# Patient Record
Sex: Male | Born: 1935 | Race: White | Hispanic: No | Marital: Married | Smoking: Never smoker
Health system: Southern US, Community
[De-identification: ages and names within clinical notes are randomized; demographics above are authoritative.]

## PROBLEM LIST (undated history)

## (undated) DIAGNOSIS — I5042 Chronic combined systolic (congestive) and diastolic (congestive) heart failure: Secondary | ICD-10-CM

## (undated) DIAGNOSIS — I10 Essential (primary) hypertension: Secondary | ICD-10-CM

## (undated) DIAGNOSIS — F419 Anxiety disorder, unspecified: Secondary | ICD-10-CM

## (undated) DIAGNOSIS — I340 Nonrheumatic mitral (valve) insufficiency: Secondary | ICD-10-CM

## (undated) DIAGNOSIS — N183 Chronic kidney disease, stage 3 unspecified: Secondary | ICD-10-CM

## (undated) DIAGNOSIS — I2510 Atherosclerotic heart disease of native coronary artery without angina pectoris: Secondary | ICD-10-CM

## (undated) DIAGNOSIS — I351 Nonrheumatic aortic (valve) insufficiency: Secondary | ICD-10-CM

## (undated) DIAGNOSIS — H9190 Unspecified hearing loss, unspecified ear: Secondary | ICD-10-CM

## (undated) DIAGNOSIS — I495 Sick sinus syndrome: Secondary | ICD-10-CM

## (undated) DIAGNOSIS — C61 Malignant neoplasm of prostate: Secondary | ICD-10-CM

## (undated) DIAGNOSIS — G40909 Epilepsy, unspecified, not intractable, without status epilepticus: Secondary | ICD-10-CM

## (undated) DIAGNOSIS — E871 Hypo-osmolality and hyponatremia: Secondary | ICD-10-CM

## (undated) DIAGNOSIS — D649 Anemia, unspecified: Secondary | ICD-10-CM

## (undated) DIAGNOSIS — I4821 Permanent atrial fibrillation: Secondary | ICD-10-CM

## (undated) DIAGNOSIS — Z7901 Long term (current) use of anticoagulants: Secondary | ICD-10-CM

## (undated) HISTORY — PX: PROSTATE SURGERY: SHX751

## (undated) HISTORY — DX: Chronic combined systolic (congestive) and diastolic (congestive) heart failure: I50.42

## (undated) HISTORY — DX: Anemia, unspecified: D64.9

## (undated) HISTORY — DX: Nonrheumatic aortic (valve) insufficiency: I35.1

## (undated) HISTORY — DX: Long term (current) use of anticoagulants: Z79.01

## (undated) HISTORY — DX: Hypo-osmolality and hyponatremia: E87.1

## (undated) HISTORY — DX: Unspecified hearing loss, unspecified ear: H91.90

## (undated) HISTORY — PX: OTHER SURGICAL HISTORY: SHX169

## (undated) HISTORY — DX: Chronic kidney disease, stage 3 (moderate): N18.3

## (undated) HISTORY — DX: Chronic kidney disease, stage 3 unspecified: N18.30

## (undated) HISTORY — DX: Nonrheumatic mitral (valve) insufficiency: I34.0

## (undated) HISTORY — DX: Permanent atrial fibrillation: I48.21

---

## 2000-09-22 DIAGNOSIS — C61 Malignant neoplasm of prostate: Secondary | ICD-10-CM

## 2000-09-22 HISTORY — DX: Malignant neoplasm of prostate: C61

## 2000-09-22 HISTORY — PX: HERNIA REPAIR: SHX51

## 2000-09-22 HISTORY — PX: PROSTATECTOMY: SHX69

## 2000-11-25 ENCOUNTER — Encounter (INDEPENDENT_AMBULATORY_CARE_PROVIDER_SITE_OTHER): Payer: Self-pay | Admitting: Specialist

## 2000-11-25 ENCOUNTER — Other Ambulatory Visit: Admission: RE | Admit: 2000-11-25 | Discharge: 2000-11-25 | Payer: Self-pay | Admitting: Urology

## 2000-12-01 ENCOUNTER — Encounter: Admission: RE | Admit: 2000-12-01 | Discharge: 2000-12-01 | Payer: Self-pay | Admitting: Urology

## 2000-12-01 ENCOUNTER — Encounter: Payer: Self-pay | Admitting: Urology

## 2001-01-26 ENCOUNTER — Encounter: Payer: Self-pay | Admitting: Urology

## 2001-02-02 ENCOUNTER — Encounter (INDEPENDENT_AMBULATORY_CARE_PROVIDER_SITE_OTHER): Payer: Self-pay | Admitting: Specialist

## 2001-02-02 ENCOUNTER — Inpatient Hospital Stay (HOSPITAL_COMMUNITY): Admission: RE | Admit: 2001-02-02 | Discharge: 2001-02-05 | Payer: Self-pay | Admitting: Surgery

## 2002-10-30 ENCOUNTER — Emergency Department (HOSPITAL_COMMUNITY): Admission: EM | Admit: 2002-10-30 | Discharge: 2002-10-30 | Payer: Self-pay | Admitting: Emergency Medicine

## 2002-10-30 ENCOUNTER — Encounter: Payer: Self-pay | Admitting: Emergency Medicine

## 2005-05-22 ENCOUNTER — Ambulatory Visit (HOSPITAL_COMMUNITY): Admission: RE | Admit: 2005-05-22 | Discharge: 2005-05-22 | Payer: Self-pay | Admitting: Cardiology

## 2006-04-13 ENCOUNTER — Encounter: Admission: RE | Admit: 2006-04-13 | Discharge: 2006-04-13 | Payer: Self-pay | Admitting: Cardiology

## 2006-04-29 ENCOUNTER — Inpatient Hospital Stay (HOSPITAL_COMMUNITY): Admission: RE | Admit: 2006-04-29 | Discharge: 2006-04-30 | Payer: Self-pay | Admitting: Cardiology

## 2006-04-29 HISTORY — PX: PACEMAKER INSERTION: SHX728

## 2006-07-21 ENCOUNTER — Ambulatory Visit (HOSPITAL_COMMUNITY): Admission: RE | Admit: 2006-07-21 | Discharge: 2006-07-21 | Payer: Self-pay | Admitting: Cardiology

## 2006-07-30 ENCOUNTER — Ambulatory Visit (HOSPITAL_COMMUNITY): Admission: RE | Admit: 2006-07-30 | Discharge: 2006-07-30 | Payer: Self-pay | Admitting: Cardiology

## 2008-06-06 ENCOUNTER — Encounter: Admission: RE | Admit: 2008-06-06 | Discharge: 2008-07-03 | Payer: Self-pay | Admitting: Family Medicine

## 2009-05-30 ENCOUNTER — Encounter: Admission: RE | Admit: 2009-05-30 | Discharge: 2009-07-23 | Payer: Self-pay | Admitting: Family Medicine

## 2009-09-26 ENCOUNTER — Inpatient Hospital Stay (HOSPITAL_COMMUNITY): Admission: AD | Admit: 2009-09-26 | Discharge: 2009-10-02 | Payer: Self-pay | Admitting: Internal Medicine

## 2009-09-26 ENCOUNTER — Ambulatory Visit: Payer: Self-pay | Admitting: Diagnostic Radiology

## 2009-09-26 ENCOUNTER — Encounter: Payer: Self-pay | Admitting: Emergency Medicine

## 2009-09-27 ENCOUNTER — Encounter (INDEPENDENT_AMBULATORY_CARE_PROVIDER_SITE_OTHER): Payer: Self-pay | Admitting: Internal Medicine

## 2010-12-08 LAB — LIPASE, BLOOD
Lipase: 16 U/L (ref 11–59)
Lipase: 17 U/L (ref 11–59)

## 2010-12-08 LAB — BASIC METABOLIC PANEL
BUN: 13 mg/dL (ref 6–23)
BUN: 14 mg/dL (ref 6–23)
BUN: 15 mg/dL (ref 6–23)
BUN: 15 mg/dL (ref 6–23)
Calcium: 7.9 mg/dL — ABNORMAL LOW (ref 8.4–10.5)
Calcium: 8 mg/dL — ABNORMAL LOW (ref 8.4–10.5)
Calcium: 8.3 mg/dL — ABNORMAL LOW (ref 8.4–10.5)
Calcium: 8.3 mg/dL — ABNORMAL LOW (ref 8.4–10.5)
Calcium: 8.5 mg/dL (ref 8.4–10.5)
Chloride: 93 mEq/L — ABNORMAL LOW (ref 96–112)
Creatinine, Ser: 0.94 mg/dL (ref 0.4–1.5)
Creatinine, Ser: 1.07 mg/dL (ref 0.4–1.5)
Creatinine, Ser: 1.07 mg/dL (ref 0.4–1.5)
GFR calc Af Amer: >60 mL/min (ref >60)
GFR calc Af Amer: >60 mL/min (ref >60)
GFR calc non Af Amer: >60 mL/min (ref >60)
GFR calc non Af Amer: >60 mL/min (ref >60)
GFR calc non Af Amer: >60 mL/min (ref >60)
GFR calc non Af Amer: >60 mL/min (ref >60)
GFR calc non Af Amer: >60 mL/min (ref >60)
GFR calc non Af Amer: >60 mL/min (ref >60)
Glucose, Bld: 128 mg/dL — ABNORMAL HIGH (ref 70–99)
Glucose, Bld: 156 mg/dL — ABNORMAL HIGH (ref 70–99)
Glucose, Bld: 157 mg/dL — ABNORMAL HIGH (ref 70–99)
Glucose, Bld: 91 mg/dL (ref 70–99)
Potassium: 3.2 mEq/L — ABNORMAL LOW (ref 3.5–5.1)
Potassium: 3.8 mEq/L (ref 3.5–5.1)
Potassium: 3.9 mEq/L (ref 3.5–5.1)
Sodium: 116 mEq/L — CL (ref 135–145)
Sodium: 121 mEq/L — ABNORMAL LOW (ref 135–145)
Sodium: 124 mEq/L — ABNORMAL LOW (ref 135–145)

## 2010-12-08 LAB — CARDIAC PANEL(CRET KIN+CKTOT+MB+TROPI)
CK, MB: 1.4 ng/mL (ref 0.3–4.0)
CK, MB: 2.8 ng/mL (ref 0.3–4.0)
Relative Index: 1.3 (ref 0.0–2.5)
Relative Index: 2 (ref 0.0–2.5)
Total CK: 110 U/L (ref 7–232)
Total CK: 209 U/L (ref 7–232)
Total CK: 75 U/L (ref 7–232)
Troponin I: 0.01 ng/mL (ref 0.00–0.06)
Troponin I: 0.03 ng/mL (ref 0.00–0.06)

## 2010-12-08 LAB — COMPREHENSIVE METABOLIC PANEL
ALT: 10 U/L (ref 0–53)
AST: 17 U/L (ref 0–37)
Albumin: 2.7 g/dL — ABNORMAL LOW (ref 3.5–5.2)
Albumin: 4.2 g/dL (ref 3.5–5.2)
BUN: 18 mg/dL (ref 6–23)
CO2: 25 mEq/L (ref 19–32)
Calcium: 7.9 mg/dL — ABNORMAL LOW (ref 8.4–10.5)
Calcium: 8.4 mg/dL (ref 8.4–10.5)
Creatinine, Ser: 1.03 mg/dL (ref 0.4–1.5)
GFR calc Af Amer: >60 mL/min (ref >60)
GFR calc non Af Amer: >60 mL/min (ref >60)
Glucose, Bld: 103 mg/dL — ABNORMAL HIGH (ref 70–99)
Sodium: 122 mEq/L — ABNORMAL LOW (ref 135–145)
Sodium: 119 mEq/L — CL (ref 135–145)
Total Protein: 5 g/dL — ABNORMAL LOW (ref 6.0–8.3)
Total Protein: 7.4 g/dL (ref 6.0–8.3)

## 2010-12-08 LAB — CBC
HCT: 29.7 % — ABNORMAL LOW (ref 39.0–52.0)
HCT: 32.8 % — ABNORMAL LOW (ref 39.0–52.0)
HCT: 39.7 % (ref 39.0–52.0)
Hemoglobin: 13.9 g/dL (ref 13.0–17.0)
MCHC: 35.2 g/dL (ref 30.0–36.0)
MCV: 101.1 fL — ABNORMAL HIGH (ref 78.0–100.0)
Platelets: 113 10*3/uL — ABNORMAL LOW (ref 150–400)
Platelets: 140 10*3/uL — ABNORMAL LOW (ref 150–400)
Platelets: 143 10*3/uL — ABNORMAL LOW (ref 150–400)
Platelets: 195 10*3/uL (ref 150–400)
RBC: 3.33 MIL/uL — ABNORMAL LOW (ref 4.22–5.81)
RDW: 13.4 % (ref 11.5–15.5)
RDW: 13.9 % (ref 11.5–15.5)
RDW: 13.2 % (ref 11.5–15.5)
WBC: 5.2 10*3/uL (ref 4.0–10.5)
WBC: 6.4 10*3/uL (ref 4.0–10.5)
WBC: 7.7 10*3/uL (ref 4.0–10.5)

## 2010-12-08 LAB — BRAIN NATRIURETIC PEPTIDE
Pro B Natriuretic peptide (BNP): 2615 pg/mL — ABNORMAL HIGH (ref 0.0–100.0)
Pro B Natriuretic peptide (BNP): >3200 pg/mL — ABNORMAL HIGH (ref 0.0–100.0)
Pro B Natriuretic peptide (BNP): >3200 pg/mL — ABNORMAL HIGH (ref 0.0–100.0)

## 2010-12-08 LAB — LIPID PANEL
HDL: 42 mg/dL (ref >39)
Triglycerides: 65 mg/dL (ref <150)
VLDL: 13 mg/dL (ref 0–40)

## 2010-12-08 LAB — PROTIME-INR
INR: 2.57 — ABNORMAL HIGH (ref 0.00–1.49)
INR: 2.62 — ABNORMAL HIGH (ref 0.00–1.49)
Prothrombin Time: 22.2 seconds — ABNORMAL HIGH (ref 11.6–15.2)
Prothrombin Time: 26.7 seconds — ABNORMAL HIGH (ref 11.6–15.2)
Prothrombin Time: 27.4 seconds — ABNORMAL HIGH (ref 11.6–15.2)
Prothrombin Time: 27.8 seconds — ABNORMAL HIGH (ref 11.6–15.2)
Prothrombin Time: 34.4 seconds — ABNORMAL HIGH (ref 11.6–15.2)

## 2010-12-08 LAB — SODIUM, URINE, RANDOM: Sodium, Ur: 46 mEq/L

## 2010-12-08 LAB — BLOOD GAS, ARTERIAL
Acid-base deficit: 0.6 mmol/L (ref 0.0–2.0)
O2 Saturation: 97.2 %
TCO2: 25.1 mmol/L (ref 0–100)
pCO2 arterial: 40.7 mmHg (ref 35.0–45.0)
pO2, Arterial: 90.6 mmHg (ref 80.0–100.0)

## 2010-12-08 LAB — DIFFERENTIAL
Basophils Relative: 0 % (ref 0–1)
Eosinophils Relative: 1 % (ref 0–5)
Lymphocytes Relative: 12 % (ref 12–46)
Neutrophils Relative %: 80 % — ABNORMAL HIGH (ref 43–77)

## 2010-12-08 LAB — T4, FREE: Free T4: 1.35 ng/dL (ref 0.80–1.80)

## 2010-12-08 LAB — TSH: TSH: 7.696 u[IU]/mL — ABNORMAL HIGH (ref 0.350–4.500)

## 2010-12-08 LAB — PSA: PSA: 0.17 ng/mL (ref 0.10–4.00)

## 2010-12-08 LAB — PENTOBARBITAL, SERUM: Pentobarbital, Serum: <2 ug/mL

## 2010-12-08 LAB — GLUCOSE, CAPILLARY

## 2011-02-07 NOTE — H&P (Signed)
[facility]  Patient:    Eddie Park, Eddie Park                     MRN: [id number] Adm. Date:  [id number] Attending:  Cristela Blue                         History and Physical  DATE OF BIRTH:  November 21, 1926  REFERRING PHYSICIANS:  Sherren Kerns. Pamella Pert, M.D., Drue Stager. Williford, M.D.  UROLOGIST:  Einar Crow, M.D.  GENERAL SURGEON:  Fenton Malling. Lucia Gaskins, M.D.  UROLOGY FIRST ASSISTANT:  Shanda Bumps., M.D.  HISTORY OF PRESENT ILLNESS:  A 77 year old white male with prostate cancer.  PAST MEDICAL HISTORY:  These are the notable points of the recent past medical history: 1. February 2002 thorough physical examination with Dr. Pamella Pert shows a nodule    in the right lobe of the prostate, PSA 5.7, F/T 11%. 2. March 2002 TRUS with BX showed a Gleason 7 adenocarcinoma involving 70% of    the biopsies on the right side and 10% of the biopsies on the left side.    Bone scan, December 01, 2000, negative.  CT of the abdomen and pelvis December 01, 2000, positive right inguinal hernia with ileum in the hernia, no    evidence of metastatic disease. 3. April 2002, consult with general surgery, Alphonsa Overall, M.D., indicates    that concurrent repair of right inguinal hernia is indicated. 4. Discussed with the patient the various options available to [place] for    treatment of his prostate cancer including watchful waiting, hormonal    therapy, radiation therapy, and surgery.  The patient strongly prefers to    proceed with surgery.  He understands about our concerns about the    possibility of positive margins and positive nodes on the right given his    70% biopsy with a Gleason 7.  I have discussed with him the risks and    benefits which include, but are not included to bleeding, infection, and    injury to adjacent bowel, nerve, and blood vessels.  Also impotence,    incontinence, bladder neck contracture formation.  The patient understands    and  agrees to proceed.  MEDICATIONS:  Phenobarbital, aspirin, which has been discontinued, and multivitamins.  ALLERGIES:  Denies.  SOCIAL HISTORY:  Tobacco denies.  ETOH denies.  PAST SURGICAL HISTORY:  Hernia repair in 1972.  The patient has had multiple hand surgeries.  FAMILY HISTORY:  Father died at 34 of an MI, mom died at 53 of an MI, and brother died at 21 of MI.  The patient has had preoperative cardiology clearance with Rodell Perna, M.D., "negative stress test".  PHYSICAL EXAMINATION:  GENERAL:  Extremely thin, fit, and alert 77 year old male appearing somewhat younger than his stated age.  VITAL SIGNS:  Weight today 138 pounds.  Blood pressure 134/56 pulse 49.  HEENT/NECK:  Negative adenopathy, negative bruit.  LUNGS:  Clear to auscultation and percussion.  HEART:  Regular rate and rhythm without murmur or gallop.  ABDOMEN:  Positive bowel sounds, soft.  Positive right inguinal hernia.  GU:  Bilaterally descended testes.  Nodular right prostate.  NEUROLOGIC:  Grossly intact.  LABORATORY:  Pending at time of dictation.  X-RAY:  Pending at time of dictation.  PLAN:  Will proceed this a.m. with right inguinal hernia repair by Dr. Lucia Gaskins. Radical retropubic prostatectomy and pelvic  lymph node dissection Drs. Humphries and [employer]. DD:  02/02/01 TD:  02/02/01 Job: [id number]

## 2011-02-07 NOTE — Discharge Summary (Signed)
NA[place]arland Park  MIKEN, DESROCHERS NO.:  [id number]   MEDICAL RECORD NO.:  [id number]          PATIENT TYPE:  INP   LOCATION:  3703                         FACILITY:  [facility]   PHYSICIAN:  Barnett Abu, M.D.  DATE OF BIRTH:  September 26, 1926   DATE OF ADMISSION:  04/29/2006  DATE OF DISCHARGE:  04/30/2006                                 DISCHARGE SUMMARY   DISCHARGE DIAGNOSES:  1. Tachy/brady syndrome, status post Jewel chamber pacemaker insertion,      Guidant.  2. Sinus node dysfunction, secondary to above with pacemaker implantation.  3. Recurrent paroxysmal atrial fibrillation, status post cardioversion      August of 2006.  4. Syncope.  5. Known coronary artery disease.  6. Moderate AI, moderate MR.  7. Left ventricular dysfunction with an ejection fraction of 40%      documented in July of 2006.  8. Hypertension.  9. Anxiety.  10.Seizure disorder.  11.Prostate cancer, status post prostatectomy in 2002.  12.Long term Coumadin use.  13.Long term medication use.   Mr. Buglione was seen in the office on April 13, 2006 complaining of a few  episodes of lightheadedness.  He has a history of atrial fibrillation, as  well as a history of syncope and sinus node dysfunction.  Decision was made  to place a permanent pacemaker in this gentleman and he arrived for the  procedure on April 29, 2006.  A dual chamber pacemaker was inserted with a  Guidant device and elective cardioversion was also performed on April 29, 2006 by Dr. Rodell Perna.   The patient was stable over night and was ready for discharge to home the  following day.  His post procedure chest x-ray showed no pneumothorax.  Dr.  Leonia Reeves also felt that the patient needed to be RV paced due to known  intrinsic AV nodal disease.   The patient was discharged home in stable condition on the following  medications:  1. Phenobarbital as prior to admission.  2. Micardis/HCTZ 40/12.5 mg daily.  3. Paxil daily.  4.  Coumadin as prior to admission.  5. Fish oil daily.  6. He is to stop his baby aspirin.  7. He may utilize Tylenol No. 3 p.r.n. chest pain.   Follow up with Dr. Leonia Reeves on May 12, 2006 at 1:15 p.m., with [facility]  Clinic on May 05, 2006 at 12 p.m. and an instruction was given to the  patient regarding activity and wound care.  He is to remain on a low-fat  diet, call for any swelling or increased pain at the pacemaker site.      Joesphine Bare, P.A.      Barnett Abu, M.D.  Electronically Signed    LB/MEDQ  D:  05/27/2006  T:  05/27/2006  Job:  [id number]

## 2011-02-07 NOTE — Op Note (Signed)
NAME:  Eddie Park, Eddie Park NO.:  [id number]   MEDICAL RECORD NO.:  [id number]          PATIENT TYPE:  OIB   LOCATION:  2899                         FACILITY:  [facility]   PHYSICIAN:  Barnett Abu, M.D.  DATE OF BIRTH:  1927/06/19   DATE OF PROCEDURE:  04/29/2006  DATE OF DISCHARGE:                                 OPERATIVE REPORT   PROCEDURES PERFORMED:  1. Dual-chamber permanent transvenous pacemaker insertion.  2. Left subclavian venogram.  3. Elective cardioversion.   INDICATIONS:  Mr. Eddie Park is a 77 year old male with a history of  syncope with documented heart rate of 35 beats per minute.  He was in atrial  fibrillation at that time.  Sinus rhythm was restored and he appeared to  have adequate heart rates in the range of 50-65 beats per minute.  However,  atrial fibrillation has recurred and his ventricular response is slow with a  rate of 40-50 beats per minute.  He is therefore brought to the cardiac  catheterization laboratory for insertion of a permanent transvenous dual-  chamber pacemaker with a diagnosis of paroxysmal atrial fibrillation and  sinus node dysfunction/tachybrady syndrome.  The patient will undergo an  elective cardioversion intraoperatively.  This will facilitate placement of  the atrial lead and restore AV synchrony.   PROCEDURE NOTE:  The patient was brought to the cardiac catheterization  laboratory in the fasting state.  The left prepectoral region was prepped  and draped in the usual sterile fashion.  Local anesthesia was obtained with  infiltration of 1% lidocaine with epinephrine throughout the left  prepectoral region.  A left subclavian venogram was then performed with the  peripheral injection of 20 mL of Omnipaque.  A digital cine angiogram was  obtained in the AP projection and road mapped to guide future left  subclavian puncture.  The venogram did demonstrate the left subclavian vein  to be widely patent and coursing  in a normal fashion over the anterior  surface of the first rib and beneath the middle third of the clavicle.  There was no evidence for persistence of the left superior vena cava.   A 6-7 cm incision was then made in the deltopectoral groove and this was  carried down by sharp dissection and electrocautery to the prepectoral  fascia.  There a are plane was lifted and a pocket formed inferiorly and  medially utilizing blunt dissection and electrocautery.  The pocket was then  packed with a 1% kanamycin-soaked gauze.  Two separate left subclavian  punctures were then performed using an 18-gauge thin-wall needle, through  which was passed a 0.038-inch tight J guidewire.  Over the initial guidewire  a 7-French tearaway sheath and dilator was advanced.  The dilator and wire  were removed and the ventricular lead was advanced to the level of the right  atrium.  The sheath was then torn away.  A figure-of-eight hemostasis suture  was placed to the pectoralis muscle around the insertion site to control  back bleeding.  Using standard technique and fluoroscopic landmarks, the  lead was manipulated into the right ventricular  apex.  There excellent  pacing parameters were obtained, as will be noted below.  The lead was  tested for diaphragmatic pacing at 10 volts and none was found.  The lead  was then sutured into place using three separate 0 silk ligatures.  Over the  remaining guidewire an 8-French tearaway sheath and dilator were advanced.  Dilator was removed.  The wire was allowed to remain in place and the atrial  lead was advanced to the level of the right atrium.  The sheath was then  torn away.  Using standard technique and fluoroscopic landmarks, the lead  was manipulated into the right atrial appendage.  Pacing parameters were not  adequate, however.  Therefore, the screw was released and the lead was  manipulated to the right atrial medial wall.  There adequate pacing  parameters were  obtained after the screw was advanced.  Those parameters are  reported below.  The lead was tested for diaphragmatic pacing at 10 volts  and none was found.  The lead was then sutured in place using three separate  0 silk ligatures.  The kanamycin-soaked gauze was then removed the pocket.  The pocket was copiously irrigated using 1% kanamycin solution.  The leads  were then attached the pacing generator, carefully identifying each by its  serial number and placing each into the appropriate receptacle.  Each lead  was tightened into place and tested for security.  The leads were then wound  beneath the pacing generator and the generator was placed in the pocket.  An  0 silk anchoring suture was applied.  The pocket was then closed using 2-0  Vicryl in a running fashion for the subcutaneous layer.  The skin was  approximated using 4-0 Vicryl in a running subcuticular fashion.  Steri-  Strips and a sterile dressing were applied and the patient was transported  to recovery area in stable condition in an A-sense, V-sense mode.   EQUIPMENT DATA:  The pacing generator is a [employer] 1+ DR,  model number [id number], serial number [id number].  The atrial lead is a Midwife number [id number], serial number [id number].  The ventricular  lead is a Banker number [id number], serial number [id number].   PACING DATA:  The ventricular lead detected a 10.1 mV R wave.  The pacing  threshold is 0.6 Vs at 0.5 msec pulse width.  The impedance was 606 ohms.  The atrial lead detected a 2.8 mV P wave.  The pacing threshold was 1.8 V at  0.5 msec pulse width.  The impedance was 432 ohms.   ELECTIVE CARDIOVERSION:  The patient was adequately sedated using a total of  4 mg of midazolam and 75 mcg of fentanyl.  While monitoring heart rate,  blood pressure, O2 saturation and ECG, the patient was delivered a transthoracic dose of DC energy at 100 joules, biphasic, synchronized.   This  resulted in prompt return of sinus rhythm.   IMPRESSION:  1. Successful elective cardioversion, atrial fibrillation to sinus rhythm.  2. Successful dual-chamber pacemaker insertion with restoration of sinus      rhythm.      Barnett Abu, M.D.  Electronically Signed     JHE/MEDQ  D:  04/29/2006  T:  04/29/2006  Job:  [id number]   cc:   Sherren Kerns. Pamella Pert, MD

## 2011-10-13 DIAGNOSIS — I4891 Unspecified atrial fibrillation: Secondary | ICD-10-CM | POA: Diagnosis not present

## 2011-10-13 DIAGNOSIS — Z7901 Long term (current) use of anticoagulants: Secondary | ICD-10-CM | POA: Diagnosis not present

## 2011-11-12 DIAGNOSIS — I4891 Unspecified atrial fibrillation: Secondary | ICD-10-CM | POA: Diagnosis not present

## 2011-11-12 DIAGNOSIS — Z7901 Long term (current) use of anticoagulants: Secondary | ICD-10-CM | POA: Diagnosis not present

## 2011-11-27 DIAGNOSIS — Z950 Presence of cardiac pacemaker: Secondary | ICD-10-CM | POA: Diagnosis not present

## 2011-12-01 DIAGNOSIS — Z961 Presence of intraocular lens: Secondary | ICD-10-CM | POA: Diagnosis not present

## 2011-12-10 DIAGNOSIS — I4891 Unspecified atrial fibrillation: Secondary | ICD-10-CM | POA: Diagnosis not present

## 2011-12-10 DIAGNOSIS — Z7901 Long term (current) use of anticoagulants: Secondary | ICD-10-CM | POA: Diagnosis not present

## 2011-12-11 DIAGNOSIS — Z79899 Other long term (current) drug therapy: Secondary | ICD-10-CM | POA: Diagnosis not present

## 2011-12-11 DIAGNOSIS — G40909 Epilepsy, unspecified, not intractable, without status epilepticus: Secondary | ICD-10-CM | POA: Diagnosis not present

## 2011-12-11 DIAGNOSIS — I10 Essential (primary) hypertension: Secondary | ICD-10-CM | POA: Diagnosis not present

## 2011-12-11 DIAGNOSIS — F329 Major depressive disorder, single episode, unspecified: Secondary | ICD-10-CM | POA: Diagnosis not present

## 2011-12-18 DIAGNOSIS — E875 Hyperkalemia: Secondary | ICD-10-CM | POA: Diagnosis not present

## 2011-12-18 DIAGNOSIS — D696 Thrombocytopenia, unspecified: Secondary | ICD-10-CM | POA: Diagnosis not present

## 2011-12-31 DIAGNOSIS — E878 Other disorders of electrolyte and fluid balance, not elsewhere classified: Secondary | ICD-10-CM | POA: Diagnosis not present

## 2012-01-08 DIAGNOSIS — E0590 Thyrotoxicosis, unspecified without thyrotoxic crisis or storm: Secondary | ICD-10-CM | POA: Diagnosis not present

## 2012-01-15 DIAGNOSIS — E878 Other disorders of electrolyte and fluid balance, not elsewhere classified: Secondary | ICD-10-CM | POA: Diagnosis not present

## 2012-01-15 DIAGNOSIS — Z7901 Long term (current) use of anticoagulants: Secondary | ICD-10-CM | POA: Diagnosis not present

## 2012-01-15 DIAGNOSIS — I4891 Unspecified atrial fibrillation: Secondary | ICD-10-CM | POA: Diagnosis not present

## 2012-01-20 DIAGNOSIS — N3942 Incontinence without sensory awareness: Secondary | ICD-10-CM | POA: Diagnosis not present

## 2012-01-20 DIAGNOSIS — N393 Stress incontinence (female) (male): Secondary | ICD-10-CM | POA: Diagnosis not present

## 2012-01-20 DIAGNOSIS — R351 Nocturia: Secondary | ICD-10-CM | POA: Diagnosis not present

## 2012-02-23 DIAGNOSIS — R339 Retention of urine, unspecified: Secondary | ICD-10-CM | POA: Diagnosis not present

## 2012-02-23 DIAGNOSIS — N3942 Incontinence without sensory awareness: Secondary | ICD-10-CM | POA: Diagnosis not present

## 2012-02-23 DIAGNOSIS — N3946 Mixed incontinence: Secondary | ICD-10-CM | POA: Diagnosis not present

## 2012-03-01 DIAGNOSIS — D485 Neoplasm of uncertain behavior of skin: Secondary | ICD-10-CM | POA: Diagnosis not present

## 2012-03-01 DIAGNOSIS — L851 Acquired keratosis [keratoderma] palmaris et plantaris: Secondary | ICD-10-CM | POA: Diagnosis not present

## 2012-03-01 DIAGNOSIS — Z85828 Personal history of other malignant neoplasm of skin: Secondary | ICD-10-CM | POA: Diagnosis not present

## 2012-03-01 DIAGNOSIS — D239 Other benign neoplasm of skin, unspecified: Secondary | ICD-10-CM | POA: Diagnosis not present

## 2012-03-05 DIAGNOSIS — I2510 Atherosclerotic heart disease of native coronary artery without angina pectoris: Secondary | ICD-10-CM | POA: Diagnosis not present

## 2012-03-05 DIAGNOSIS — Z7901 Long term (current) use of anticoagulants: Secondary | ICD-10-CM | POA: Diagnosis not present

## 2012-03-05 DIAGNOSIS — Z950 Presence of cardiac pacemaker: Secondary | ICD-10-CM | POA: Diagnosis not present

## 2012-03-05 DIAGNOSIS — I4891 Unspecified atrial fibrillation: Secondary | ICD-10-CM | POA: Diagnosis not present

## 2012-03-12 DIAGNOSIS — I4891 Unspecified atrial fibrillation: Secondary | ICD-10-CM | POA: Diagnosis not present

## 2012-03-12 DIAGNOSIS — Z7901 Long term (current) use of anticoagulants: Secondary | ICD-10-CM | POA: Diagnosis not present

## 2012-03-26 DIAGNOSIS — Z7901 Long term (current) use of anticoagulants: Secondary | ICD-10-CM | POA: Diagnosis not present

## 2012-03-26 DIAGNOSIS — I4891 Unspecified atrial fibrillation: Secondary | ICD-10-CM | POA: Diagnosis not present

## 2012-03-29 ENCOUNTER — Emergency Department (HOSPITAL_COMMUNITY)
Admission: EM | Admit: 2012-03-29 | Discharge: 2012-03-29 | Disposition: A | Discharge status: Home / Self Care | Payer: Medicare Other | Attending: Emergency Medicine | Admitting: Emergency Medicine

## 2012-03-29 ENCOUNTER — Emergency Department (HOSPITAL_COMMUNITY): Payer: Medicare Other

## 2012-03-29 ENCOUNTER — Encounter (HOSPITAL_COMMUNITY): Payer: Self-pay | Admitting: *Deleted

## 2012-03-29 DIAGNOSIS — K222 Esophageal obstruction: Secondary | ICD-10-CM | POA: Diagnosis not present

## 2012-03-29 DIAGNOSIS — R1310 Dysphagia, unspecified: Secondary | ICD-10-CM | POA: Diagnosis not present

## 2012-03-29 DIAGNOSIS — M5030 Other cervical disc degeneration, unspecified cervical region: Secondary | ICD-10-CM | POA: Diagnosis not present

## 2012-03-29 DIAGNOSIS — I517 Cardiomegaly: Secondary | ICD-10-CM | POA: Diagnosis not present

## 2012-03-29 HISTORY — DX: Atherosclerotic heart disease of native coronary artery without angina pectoris: I25.10

## 2012-03-29 NOTE — ED Notes (Signed)
Patient taking little sips of water and tolerating well.

## 2012-03-29 NOTE — ED Notes (Signed)
Pt states can't keep anything down without having reflux; per family water will go down but he feels like it stops like he has something caught in esophagus.  Took vitamin yesterday and felt like it got caught.  Ate breakfast this morning without difficulty; speaking in full sentences no respiratory distress

## 2012-03-29 NOTE — ED Notes (Signed)
Pt. Still feels like there is a slight obstruction when he swallows while drinking water.

## 2012-03-29 NOTE — ED Provider Notes (Signed)
History     CSN: [id number]  Arrival date & time 03/29/12  1800   First MD Initiated Contact with Patient 03/29/12 2005      Chief Complaint  Patient presents with  . Foreign Body    in throat   HPI  History provided by the patient and family. Patient is 78 year old male with history of hypertension, hyperlipidemia, CAD prostate cancer who presents with complaints of difficulty swallowing and foreign body sensation in the esophagus. Patient reports his symptoms came increased earlier this afternoon after eating lunch. Patient was eating vegetables and a piece of chicken at the time. Patient is edentulous and was not wearing his dentures at that time. Family reports that he had similar symptoms 3 weeks ago but they seemed to resolved has not had any prior history of esophageal stricture. Patient denies having any shortness of breath or difficulty breathing. Patient has been handling secretions denies any vomiting symptoms. Symptoms are described as mild to moderate. They're no other aggravating or alleviating factors.    Past Medical History  Diagnosis Date  . Coronary artery disease   . Cancer     Past Surgical History  Procedure Date  . Prostate surgery   . Pacemaker insertion   . Artificial urinary sphincter     No family history on file.  History  Substance Use Topics  . Smoking status: Never Smoker   . Smokeless tobacco: Not on file  . Alcohol Use: No      Review of Systems  Constitutional: Negative for fever and chills.  HENT: Positive for trouble swallowing. Negative for sore throat and drooling.   Respiratory: Negative for shortness of breath.   Cardiovascular: Negative for chest pain.  Gastrointestinal: Negative for nausea, vomiting and abdominal pain.  Neurological: Negative for light-headedness and headaches.    Allergies  Review of patient's allergies indicates no known allergies.  Home Medications   Current Outpatient Rx  Name Route Sig Dispense  Refill  . VITAMIN D 1000 UNITS PO TABS Oral Take 2,000 Units by mouth daily.    . OMEGA-3 FATTY ACIDS 1000 MG PO CAPS Oral Take 2 g by mouth daily.    . FUROSEMIDE 20 MG PO TABS Oral Take 20 mg by mouth daily as needed. For fluid.    [name] LISINOPRIL 20 MG PO TABS Oral Take 20 mg by mouth daily.    [name] METOPROLOL SUCCINATE ER 50 MG PO TB24 Oral Take 50 mg by mouth daily. Take with or immediately following a meal.    . ADULT MULTIVITAMIN W/MINERALS CH Oral Take 1 tablet by mouth daily.    [name] PHENOBARBITAL 97.2 MG PO TABS Oral Take 97.2 mg by mouth at bedtime.    . SERTRALINE HCL 100 MG PO TABS Oral Take 100 mg by mouth daily.    [name] VITAMIN B-12 1000 MCG PO TABS Oral Take 1,000 mcg by mouth daily.    . WARFARIN SODIUM 5 MG PO TABS Oral Take 5-7.5 mg by mouth daily. 1.5 tab on MWF, 1 tab daily all other days.      BP 129/56  Pulse 63  Temp 97.9 F (36.6 C)  Resp 20  SpO2 97%  Physical Exam  Nursing note and vitals reviewed. Constitutional: He is oriented to person, place, and time. He appears well-developed and well-nourished. No distress.  HENT:  Head: Normocephalic.  Mouth/Throat: Oropharynx is clear and moist.       Edentulous. Hearing aids in place.  Neck: Normal range of  motion. Neck supple. No tracheal deviation present.       No crepitus and nontender.  Cardiovascular: Normal rate and regular rhythm.   Pulmonary/Chest: Effort normal and breath sounds normal. No stridor. No respiratory distress. He has no wheezes. He has no rales.  Abdominal: Soft. There is no tenderness.  Neurological: He is alert and oriented to person, place, and time.  Skin: Skin is warm and dry.  Psychiatric: He has a normal mood and affect. His behavior is normal.    ED Course  Procedures   Dg Neck Soft Tissue  03/29/2012  *RADIOLOGY REPORT*  Clinical Data: Feeling of an object stuck in throat  NECK SOFT TISSUES - 1+ VIEW  Comparison: None.  Findings: The cervical vertebrae are in normal alignment.  There is  degenerative disc disease at C5-6 and C6-7 levels.  No prevertebral soft tissue swelling is seen.  No opaque foreign body is noted. The bones are diffusely osteopenic.  IMPRESSION: Normal alignment with degenerative disc disease at C5-6 and C6-7. No opaque foreign body is seen.  Original Report Authenticated By: Joretta Bachelor, M.D.   Dg Chest 2 View  03/29/2012  *RADIOLOGY REPORT*  Clinical Data: Feeling of an stuck in throat  CHEST - 2 VIEW  Comparison: Portable chest x-ray of 09/29/2009  Findings: The lungs are clear but somewhat hyperaerated.  Moderate cardiomegaly is present and a permanent pacemaker is noted.  No opaque foreign body is seen.  The bones are osteopenic.  IMPRESSION: Moderate cardiomegaly with permanent pacemaker.  Slight hyperaeration.  No active lung disease.  Original Report Authenticated By: Joretta Bachelor, M.D.     1. Esophageal stricture       MDM  8:05PM patient seen and evaluated. Patient no acute distress. Patient feeling secretions at this time. Patient has no respiratory complaints. Normal respirations and O2 sats.  Patient was seen and discussed with attending physician. Patient handling secretions and tolerating by mouth liquids. At this time we'll give GI followup and have patient call in the morning for appointment.      Martie Lee, [place] 03/30/12 [phone]

## 2012-03-30 NOTE — ED Provider Notes (Signed)
Medical screening examination/treatment/procedure(s) were performed by non-physician practitioner and as supervising physician I was immediately available for consultation/collaboration.  Jasper Riling. Alvino Chapel, MD 03/30/12 [phone]

## 2012-03-31 ENCOUNTER — Other Ambulatory Visit: Payer: Self-pay | Admitting: Gastroenterology

## 2012-03-31 DIAGNOSIS — R1310 Dysphagia, unspecified: Secondary | ICD-10-CM | POA: Diagnosis not present

## 2012-03-31 DIAGNOSIS — R109 Unspecified abdominal pain: Secondary | ICD-10-CM | POA: Diagnosis not present

## 2012-04-06 ENCOUNTER — Ambulatory Visit
Admission: RE | Admit: 2012-04-06 | Discharge: 2012-04-06 | Disposition: A | Discharge status: Home / Self Care | Payer: Medicare Other | Source: Ambulatory Visit | Attending: Gastroenterology | Admitting: Gastroenterology

## 2012-04-06 DIAGNOSIS — R1310 Dysphagia, unspecified: Secondary | ICD-10-CM

## 2012-04-07 DIAGNOSIS — E0590 Thyrotoxicosis, unspecified without thyrotoxic crisis or storm: Secondary | ICD-10-CM | POA: Diagnosis not present

## 2012-04-16 DIAGNOSIS — Z7901 Long term (current) use of anticoagulants: Secondary | ICD-10-CM | POA: Diagnosis not present

## 2012-04-16 DIAGNOSIS — I4891 Unspecified atrial fibrillation: Secondary | ICD-10-CM | POA: Diagnosis not present

## 2012-04-23 DIAGNOSIS — Z7901 Long term (current) use of anticoagulants: Secondary | ICD-10-CM | POA: Diagnosis not present

## 2012-04-23 DIAGNOSIS — I4891 Unspecified atrial fibrillation: Secondary | ICD-10-CM | POA: Diagnosis not present

## 2012-04-26 DIAGNOSIS — N3946 Mixed incontinence: Secondary | ICD-10-CM | POA: Diagnosis not present

## 2012-04-26 DIAGNOSIS — R351 Nocturia: Secondary | ICD-10-CM | POA: Diagnosis not present

## 2012-04-30 DIAGNOSIS — Z7901 Long term (current) use of anticoagulants: Secondary | ICD-10-CM | POA: Diagnosis not present

## 2012-04-30 DIAGNOSIS — I4891 Unspecified atrial fibrillation: Secondary | ICD-10-CM | POA: Diagnosis not present

## 2012-05-10 DIAGNOSIS — K5900 Constipation, unspecified: Secondary | ICD-10-CM | POA: Diagnosis not present

## 2012-05-10 DIAGNOSIS — R1310 Dysphagia, unspecified: Secondary | ICD-10-CM | POA: Diagnosis not present

## 2012-05-10 DIAGNOSIS — Z7901 Long term (current) use of anticoagulants: Secondary | ICD-10-CM | POA: Diagnosis not present

## 2012-05-10 DIAGNOSIS — I4891 Unspecified atrial fibrillation: Secondary | ICD-10-CM | POA: Diagnosis not present

## 2012-05-28 DIAGNOSIS — I4891 Unspecified atrial fibrillation: Secondary | ICD-10-CM | POA: Diagnosis not present

## 2012-05-28 DIAGNOSIS — Z7901 Long term (current) use of anticoagulants: Secondary | ICD-10-CM | POA: Diagnosis not present

## 2012-06-08 ENCOUNTER — Encounter (HOSPITAL_COMMUNITY): Payer: Self-pay | Admitting: Pharmacy Technician

## 2012-06-08 ENCOUNTER — Encounter (HOSPITAL_COMMUNITY)
Admission: RE | Disposition: A | Discharge status: Home / Self Care | Payer: Self-pay | Source: Ambulatory Visit | Attending: Internal Medicine

## 2012-06-08 ENCOUNTER — Encounter (HOSPITAL_COMMUNITY): Payer: Self-pay | Admitting: Internal Medicine

## 2012-06-08 ENCOUNTER — Ambulatory Visit (HOSPITAL_COMMUNITY)
Admission: RE | Admit: 2012-06-08 | Discharge: 2012-06-08 | Disposition: A | Discharge status: Home / Self Care | Payer: Medicare Other | Source: Ambulatory Visit | Attending: Internal Medicine | Admitting: Internal Medicine

## 2012-06-08 DIAGNOSIS — I4821 Permanent atrial fibrillation: Secondary | ICD-10-CM

## 2012-06-08 DIAGNOSIS — I10 Essential (primary) hypertension: Secondary | ICD-10-CM | POA: Diagnosis not present

## 2012-06-08 DIAGNOSIS — I495 Sick sinus syndrome: Secondary | ICD-10-CM

## 2012-06-08 DIAGNOSIS — Z45018 Encounter for adjustment and management of other part of cardiac pacemaker: Secondary | ICD-10-CM | POA: Insufficient documentation

## 2012-06-08 DIAGNOSIS — I059 Rheumatic mitral valve disease, unspecified: Secondary | ICD-10-CM | POA: Insufficient documentation

## 2012-06-08 DIAGNOSIS — I498 Other specified cardiac arrhythmias: Secondary | ICD-10-CM | POA: Insufficient documentation

## 2012-06-08 DIAGNOSIS — I4891 Unspecified atrial fibrillation: Secondary | ICD-10-CM | POA: Insufficient documentation

## 2012-06-08 DIAGNOSIS — Z950 Presence of cardiac pacemaker: Secondary | ICD-10-CM | POA: Diagnosis not present

## 2012-06-08 DIAGNOSIS — Z7901 Long term (current) use of anticoagulants: Secondary | ICD-10-CM | POA: Diagnosis not present

## 2012-06-08 HISTORY — PX: PERMANENT PACEMAKER GENERATOR CHANGE: SHX6022

## 2012-06-08 HISTORY — DX: Malignant neoplasm of prostate: C61

## 2012-06-08 HISTORY — DX: Essential (primary) hypertension: I10

## 2012-06-08 HISTORY — DX: Epilepsy, unspecified, not intractable, without status epilepticus: G40.909

## 2012-06-08 HISTORY — DX: Anxiety disorder, unspecified: F41.9

## 2012-06-08 HISTORY — DX: Sick sinus syndrome: I49.5

## 2012-06-08 LAB — BASIC METABOLIC PANEL
CO2: 26 mEq/L (ref 19–32)
Calcium: 9.1 mg/dL (ref 8.4–10.5)
Chloride: 100 mEq/L (ref 96–112)
Creatinine, Ser: 1.38 mg/dL — ABNORMAL HIGH (ref 0.50–1.35)
Glucose, Bld: 88 mg/dL (ref 70–99)

## 2012-06-08 LAB — CBC
HCT: 31.1 % — ABNORMAL LOW (ref 39.0–52.0)
Hemoglobin: 10.4 g/dL — ABNORMAL LOW (ref 13.0–17.0)
MCH: 32.7 pg (ref 26.0–34.0)
MCHC: 33.4 g/dL (ref 30.0–36.0)
MCV: 97.8 fL (ref 78.0–100.0)
Platelets: 111 10*3/uL — ABNORMAL LOW (ref 150–400)
RBC: 3.18 MIL/uL — ABNORMAL LOW (ref 4.22–5.81)
RDW: 11.9 % (ref 11.5–15.5)
WBC: 6.2 10*3/uL (ref 4.0–10.5)

## 2012-06-08 LAB — SURGICAL PCR SCREEN
MRSA, PCR: NEGATIVE
Staphylococcus aureus: NEGATIVE

## 2012-06-08 SURGERY — PERMANENT PACEMAKER GENERATOR CHANGE
Anesthesia: LOCAL

## 2012-06-08 MED ORDER — SODIUM CHLORIDE 0.9 % IV SOLN: 250.0000 mL | INTRAVENOUS | Status: DC

## 2012-06-08 MED ORDER — SODIUM CHLORIDE 0.9 % IJ SOLN: 3.0000 mL | INTRAMUSCULAR | Status: DC | PRN

## 2012-06-08 MED ORDER — SODIUM CHLORIDE 0.9 % IJ SOLN: 3.0000 mL | Freq: Two times a day (BID) | INTRAMUSCULAR | Status: DC

## 2012-06-08 MED ORDER — MUPIROCIN 2 % EX OINT
TOPICAL_OINTMENT | CUTANEOUS | Status: AC
  Filled 2012-06-08: qty 22

## 2012-06-08 MED ORDER — SODIUM CHLORIDE 0.9 % IV SOLN: 250.0000 mL | INTRAVENOUS | Status: DC | PRN

## 2012-06-08 MED ORDER — ACETAMINOPHEN 325 MG PO TABS: 325.0000 mg | ORAL_TABLET | ORAL | Status: DC | PRN

## 2012-06-08 MED ORDER — CHLORHEXIDINE GLUCONATE 4 % EX LIQD
60.0000 mL | Freq: Once | CUTANEOUS | Status: DC
  Filled 2012-06-08: qty 60

## 2012-06-08 MED ORDER — SODIUM CHLORIDE 0.9 % IR SOLN
80.0000 mg | Status: DC
  Filled 2012-06-08: qty 2

## 2012-06-08 MED ORDER — CEFAZOLIN SODIUM-DEXTROSE 2-3 GM-% IV SOLR
2.0000 g | INTRAVENOUS | Status: DC
  Filled 2012-06-08 (×2): qty 50

## 2012-06-08 MED ORDER — LIDOCAINE HCL (PF) 1 % IJ SOLN
INTRAMUSCULAR | Status: AC
  Filled 2012-06-08: qty 60

## 2012-06-08 MED ORDER — SODIUM CHLORIDE 0.45 % IV SOLN
INTRAVENOUS | Status: DC
  Administered 2012-06-08: 50 mL/h via INTRAVENOUS

## 2012-06-08 MED ORDER — MUPIROCIN 2 % EX OINT
TOPICAL_OINTMENT | Freq: Two times a day (BID) | CUTANEOUS | Status: DC
  Administered 2012-06-08: 1 via NASAL
  Filled 2012-06-08: qty 22

## 2012-06-08 MED ORDER — ONDANSETRON HCL 4 MG/2ML IJ SOLN: 4.0000 mg | Freq: Four times a day (QID) | INTRAMUSCULAR | Status: DC | PRN

## 2012-06-08 NOTE — Op Note (Signed)
SURGEON:  Thompson Grayer, MD     PREPROCEDURE DIAGNOSES:   1. Symptomatic bradycardia  2. Persistent Atrial fibrillation.     POSTPROCEDURE DIAGNOSES:   1. Symptomatic bradycardia  2. Persistent Atrial fibrillation.     PROCEDURES:   1. Pacemaker pulse generator replacement.   2. Skin pocket revision.     INTRODUCTION:  Eddie Park is a 78 y.o. male with a history of symptomatic bradycardia. He has done well since his pacemaker was implanted.  He has recently reached EOL battery status.  He presents today for pacemaker pulse generator replacement.       DESCRIPTION OF THE PROCEDURE:  Informed written consent was obtained, and the patient was brought to the electrophysiology lab in the fasting state.  The patient's pacemaker was interrogated today and found to be at elective replacement indicator battery status.  The patient required no sedation for the procedure today.  The patient's left chest was prepped and draped in the usual sterile fashion by the EP lab staff.  The skin overlying the existing pacemaker was infiltrated with lidocaine for local analgesia.  A 4-cm incision was made over the pacemaker pocket.  Using a combination of sharp and blunt dissection, the pacemaker was exposed and removed from the body.  The device was disconnected from the leads. A single silk suture was identified and removed which had secured the device to the pectoralis fascia.  There was no foreign matter or debris within the pocket.  The atrial lead was confirmed to be a Mudlogger- (serial number [id number]) lead implanted on 04/29/2006.  The right ventricular lead was confirmed to be a Nutritional therapist (serial number [id number]) lead implanted on the same date as the atrial lead (above).  Both leads were examined and their integrity was confirmed to be intact.  Atrial fibrillation waves measured 0.4 mV with impedance of 328 ohms.  Threshold could not be measured due to afib.  Right  ventricular lead R-waves measured 12.8 mV with impedance of 393 ohms and a threshold of 0.6 V at 0.5 msec.  Both leads were connected to a [place] [id number] EL (serial number [id number]) pacemaker.  The pocket was revised to accommodate this new device.  Electrocautery was required to assure hemostasis.  The pocket was irrigated with copious gentamicin solution. The pacemaker was then placed into the pocket.  The pocket was then closed in 2 layers with 2-0 Vicryl suture over the subcutaneous and subcuticular layers.  Steri-Strips and a sterile dressing were then applied.  There were no early apparent complications.     CONCLUSIONS:   1. Successful pacemaker pulse generator replacement for end of life battery status   2. No early apparent complications.     Thompson Grayer, MD 06/08/2012 5:03 PM

## 2012-06-08 NOTE — H&P (Signed)
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 male with a h/o persistent atrial fibrillation, bradycardia, and syncope sp PPM [employer] investment [employer]) by Dr Leonia Reeves who presents today for urgent EP evaluation.  He presented initially in 2007 with symptomatic bradycardia and syncope with atrial fibrillation.  He was cardioverted and had improvement in heart rates initially.  He returned to afib and had syncope which was felt to be due to bradycardia.  He therefore underwent PPM implantation at that time.  The patient reports doing very well since having a pacemaker implanted and remains very active despite his age.  He continues to drive.  He has had no further syncope since his pacemaker was implanted. In June, his device was interrogated and per report was felt to have more than 1 year of battery remaining.  Over the past few days, he reports decreased exercise tolerance and dizziness.  His wife checked his heart rate and found it to be 50.  The patient therefore presented to Dr Hassell Done office today where the device was interrogated and found to have reverted to VVI 50 bpm and had reached end of life abruptly.  Due to concerns that his battery longevity is not dependable, he is sent urgently to [facility].  Today, he  denies symptoms of palpitations, chest pain, orthopnea, PND, lower extremity edema,presyncope, syncope, or neurologic sequela.  The patientis tolerating medications without difficulties and is otherwise without complaint today.   Past Medical History  Diagnosis Date  . Coronary artery disease     s/p MI 1995  . Prostate cancer 2002  . Persistent atrial fibrillation   . Tachycardia-bradycardia syndrome     with syncope, s/p PPM by Dr Leonia Reeves  . Hypertension   . Seizure disorder   . Anxiety   . Mitral valve regurgitation    Past Surgical History  Procedure Date  . Prostate surgery   . Pacemaker insertion 04/29/06    [employer] PPM  implanted by Dr Leonia Reeves for tachy/brady syndrome and syncope  . Artificial urinary sphincter     History   Social History  . Marital Status: Married    Spouse Name: N/A    Number of Children: N/A  . Years of Education: N/A   Occupational History  . Not on file.   Social History Main Topics  . Smoking status: Never Smoker   . Smokeless tobacco: Not on file  . Alcohol Use: No  . Drug Use: No  . Sexually Active: Not on file   Other Topics Concern  . Not on file   Social History Narrative   Lives in [place]    Family History  Problem Relation Age of Onset  . Coronary artery disease      No Known Allergies  Current Facility-Administered Medications  Medication Dose Route Frequency Provider Last Rate Last Dose  . 0.45 % sodium chloride infusion   Intravenous Continuous Thompson Grayer, MD      . 0.9 %  sodium chloride infusion  250 mL Intravenous Continuous Thompson Grayer, MD      . ceFAZolin (ANCEF) IVPB 2 g/50 mL premix  2 g Intravenous On Call Thompson Grayer, MD      . chlorhexidine (HIBICLENS) 4 % liquid 4 application  60 mL Topical Once Thompson Grayer, MD      . gentamicin (GARAMYCIN) 80 mg in sodium chloride irrigation 0.9 % 500 mL irrigation  80 mg Irrigation On Call Thompson Grayer,  MD      . sodium chloride 0.9 % injection 3 mL  3 mL Intravenous Q12H Thompson Grayer, MD      . sodium chloride 0.9 % injection 3 mL  3 mL Intravenous PRN Thompson Grayer, MD        ROS- all systems are reviewed and negative except as per HPI  Physical Exam: There were no vitals filed for this visit.  GEN- The patient is elderly appearing, alert and oriented x 3 today.   Head- normocephalic, atraumatic Eyes-  Sclera clear, conjunctiva pink Ears- hearing intact Oropharynx- clear Neck- supple, no JVP Lymph- no cervical lymphadenopathy Lungs- Clear to ausculation bilaterally, normal work of breathing Chest- pacemaker pocket is well healed Heart- Regular rate and rhythm  (paced) GI- soft, NT, ND, + BS Extremities- no clubbing, cyanosis, or edema MS- diffuse muscle atrophy Skin- no rash or lesion, L sided pacemaker pocket is well healed Psych- euthymic mood, full affect Neuro- strength and sensation are intact  Pacemaker interrogation- reviewed in detail today and confirms EOL status INR today is 2.8 per Dr Hassell Done office  Assessment and Plan:  1. Bradycardia/ pacemaker at end of life.  The patient has symptomatic bradycardia and prior syncope.  His pacemaker has reached end of life and is no longer dependable.  I would therefore recommend pacemaker pulse generator replacement at this time.  Risks, benefits, alternatives to this procedure were discussed in detail with the patient today. The patient understands that the risks include but are not limited to bleeding, infection, and if lead revision is required--> pneumothorax, perforation, tamponade, vascular damage, renal failure, MI, stroke, death,  and lead dislodgement.  The patient accepts these risks and wishes to proceed. We will therefore proceed with the procedure at this time.   2. Persistent afib Continue coumadin long term, though hold coumadin for the next 48 hours s/p PPM generator change  3. HTN- stable  4. CAD- stable  Jeneen Rinks Odelia Graciano,MD

## 2012-06-10 ENCOUNTER — Encounter: Payer: Self-pay | Admitting: *Deleted

## 2012-06-10 DIAGNOSIS — Z950 Presence of cardiac pacemaker: Secondary | ICD-10-CM | POA: Insufficient documentation

## 2012-06-15 DIAGNOSIS — I4891 Unspecified atrial fibrillation: Secondary | ICD-10-CM | POA: Diagnosis not present

## 2012-06-15 DIAGNOSIS — Z7901 Long term (current) use of anticoagulants: Secondary | ICD-10-CM | POA: Diagnosis not present

## 2012-06-21 ENCOUNTER — Encounter: Payer: Self-pay | Admitting: Internal Medicine

## 2012-06-21 ENCOUNTER — Ambulatory Visit (INDEPENDENT_AMBULATORY_CARE_PROVIDER_SITE_OTHER): Payer: Medicare Other | Admitting: *Deleted

## 2012-06-21 DIAGNOSIS — I495 Sick sinus syndrome: Secondary | ICD-10-CM

## 2012-06-21 LAB — PACEMAKER DEVICE OBSERVATION
AL IMPEDENCE PM: 394 Ohm
ATRIAL PACING PM: 1
BRDY-0002RV: 60 {beats}/min
RV LEAD IMPEDENCE PM: 373 Ohm

## 2012-06-21 NOTE — Progress Notes (Signed)
Wound check-PPM 

## 2012-06-23 ENCOUNTER — Encounter: Payer: Medicare Other | Admitting: Internal Medicine

## 2012-06-25 DIAGNOSIS — I4891 Unspecified atrial fibrillation: Secondary | ICD-10-CM | POA: Diagnosis not present

## 2012-06-25 DIAGNOSIS — Z7901 Long term (current) use of anticoagulants: Secondary | ICD-10-CM | POA: Diagnosis not present

## 2012-06-28 DIAGNOSIS — R1310 Dysphagia, unspecified: Secondary | ICD-10-CM | POA: Diagnosis not present

## 2012-07-08 DIAGNOSIS — Z23 Encounter for immunization: Secondary | ICD-10-CM | POA: Diagnosis not present

## 2012-07-08 DIAGNOSIS — E0590 Thyrotoxicosis, unspecified without thyrotoxic crisis or storm: Secondary | ICD-10-CM | POA: Diagnosis not present

## 2012-07-16 DIAGNOSIS — Z7901 Long term (current) use of anticoagulants: Secondary | ICD-10-CM | POA: Diagnosis not present

## 2012-07-16 DIAGNOSIS — I4891 Unspecified atrial fibrillation: Secondary | ICD-10-CM | POA: Diagnosis not present

## 2012-07-30 DIAGNOSIS — I4891 Unspecified atrial fibrillation: Secondary | ICD-10-CM | POA: Diagnosis not present

## 2012-07-30 DIAGNOSIS — Z7901 Long term (current) use of anticoagulants: Secondary | ICD-10-CM | POA: Diagnosis not present

## 2012-08-27 DIAGNOSIS — Z7901 Long term (current) use of anticoagulants: Secondary | ICD-10-CM | POA: Diagnosis not present

## 2012-08-27 DIAGNOSIS — I4891 Unspecified atrial fibrillation: Secondary | ICD-10-CM | POA: Diagnosis not present

## 2012-09-08 DIAGNOSIS — Z7901 Long term (current) use of anticoagulants: Secondary | ICD-10-CM | POA: Diagnosis not present

## 2012-09-08 DIAGNOSIS — I4891 Unspecified atrial fibrillation: Secondary | ICD-10-CM | POA: Diagnosis not present

## 2012-09-08 DIAGNOSIS — I059 Rheumatic mitral valve disease, unspecified: Secondary | ICD-10-CM | POA: Diagnosis not present

## 2012-09-08 DIAGNOSIS — I2510 Atherosclerotic heart disease of native coronary artery without angina pectoris: Secondary | ICD-10-CM | POA: Diagnosis not present

## 2012-09-09 DIAGNOSIS — E0590 Thyrotoxicosis, unspecified without thyrotoxic crisis or storm: Secondary | ICD-10-CM | POA: Diagnosis not present

## 2012-10-06 ENCOUNTER — Encounter: Payer: Self-pay | Admitting: Internal Medicine

## 2012-10-06 ENCOUNTER — Ambulatory Visit (INDEPENDENT_AMBULATORY_CARE_PROVIDER_SITE_OTHER): Payer: Medicare Other | Admitting: Internal Medicine

## 2012-10-06 VITALS — BP 107/52 | HR 62 | Ht 66.0 in | Wt 113.0 lb

## 2012-10-06 DIAGNOSIS — I4891 Unspecified atrial fibrillation: Secondary | ICD-10-CM | POA: Diagnosis not present

## 2012-10-06 DIAGNOSIS — I495 Sick sinus syndrome: Secondary | ICD-10-CM | POA: Diagnosis not present

## 2012-10-06 DIAGNOSIS — Z7901 Long term (current) use of anticoagulants: Secondary | ICD-10-CM | POA: Diagnosis not present

## 2012-10-06 LAB — PACEMAKER DEVICE OBSERVATION
AL AMPLITUDE: 0.6 mv
ATRIAL PACING PM: 1
BRDY-0002RV: 60 {beats}/min
BRDY-0003RV: 110 {beats}/min
DEVICE MODEL PM: 111635

## 2012-10-06 NOTE — Progress Notes (Signed)
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  Since his recent generator change, the patient reports doing very well.  Today, he denies symptoms of palpitations, chest pain, shortness of breath,  lower extremity edema, dizziness, presyncope, or syncope.  The patient is otherwise without complaint today.   Past Medical History  Diagnosis Date  . Coronary artery disease     s/p MI 1995  . Prostate cancer 2002  . Persistent atrial fibrillation   . Tachycardia-bradycardia syndrome     with syncope, s/p PPM by Dr Leonia Reeves  . Hypertension   . Seizure disorder   . Anxiety   . Mitral valve regurgitation    Past Surgical History  Procedure Date  . Prostate surgery   . Pacemaker insertion 04/29/06    Generator change (BSc) by Dr Rayann Heman 06/21/12  . Artificial urinary sphincter     Current Outpatient Prescriptions  Medication Sig Dispense Refill  . cholecalciferol (VITAMIN D) 1000 UNITS tablet Take 2,000 Units by mouth daily.      . fish oil-omega-3 fatty acids 1000 MG capsule Take 2 g by mouth daily.      [name] lisinopril (PRINIVIL,ZESTRIL) 20 MG tablet Take 20 mg by mouth daily.      . methimazole (TAPAZOLE) 5 MG tablet Take 5 mg by mouth daily.      . metoprolol succinate (TOPROL-XL) 50 MG 24 hr tablet Take 50 mg by mouth daily. Take with or immediately following a meal.      . Multiple Vitamin (MULTIVITAMIN WITH MINERALS) TABS Take 1 tablet by mouth daily.      [name] PHENobarbital (LUMINAL) 97.2 MG tablet Take 97.2 mg by mouth at bedtime.      . sertraline (ZOLOFT) 100 MG tablet Take 100 mg by mouth daily.      . vitamin B-12 (CYANOCOBALAMIN) 1000 MCG tablet Take 1,000 mcg by mouth daily.      [name] warfarin (COUMADIN) 5 MG tablet Take 5-7.5 mg by mouth daily. 1.5 (=7.5mg ) tab on MWF, 1 (=5mg ) tab daily all other days.        Physical Exam: Filed Vitals:   10/06/12 0900  BP: 107/52  Pulse: 62  Height: 5\' 6"   (1.676 m)  Weight: 113 lb (51.256 kg)  SpO2: 96%    GEN- The patient is well appearing, alert and oriented x 3 today.   Head- normocephalic, atraumatic Eyes-  Sclera clear, conjunctiva pink Ears- hearing intact Oropharynx- clear Lungs- Clear to ausculation bilaterally, normal work of breathing Chest- pacemaker pocket is well healed Heart- Regular rate and rhythm, no murmurs, rubs or gallops, PMI not laterally displaced GI- soft, NT, ND, + BS Extremities- no clubbing, cyanosis, or edema  Pacemaker interrogation- reviewed in detail today,  See PACEART report  Assessment and Plan:  1. Bradycardia/ tachycardia Normal pacemaker function See Claudia Desanctis Art report No changes today  2. afib Continue coumadin long term Return 9/14 for device follow-up  Dr Irish Lack to see as scheduled

## 2012-10-06 NOTE — Patient Instructions (Signed)
Your physician wants you to follow-up in: Sept 2014 with Dr Rayann Heman Dennis Bast will receive a reminder letter in the mail two months in advance. If you don't receive a letter, please call our office to schedule the follow-up appointment.

## 2012-11-01 DIAGNOSIS — Z7901 Long term (current) use of anticoagulants: Secondary | ICD-10-CM | POA: Diagnosis not present

## 2012-11-01 DIAGNOSIS — I4891 Unspecified atrial fibrillation: Secondary | ICD-10-CM | POA: Diagnosis not present

## 2012-11-16 DIAGNOSIS — E0590 Thyrotoxicosis, unspecified without thyrotoxic crisis or storm: Secondary | ICD-10-CM | POA: Diagnosis not present

## 2012-11-30 DIAGNOSIS — Z79899 Other long term (current) drug therapy: Secondary | ICD-10-CM | POA: Diagnosis not present

## 2012-11-30 DIAGNOSIS — I10 Essential (primary) hypertension: Secondary | ICD-10-CM | POA: Diagnosis not present

## 2012-11-30 DIAGNOSIS — E7800 Pure hypercholesterolemia, unspecified: Secondary | ICD-10-CM | POA: Diagnosis not present

## 2012-12-01 DIAGNOSIS — I4891 Unspecified atrial fibrillation: Secondary | ICD-10-CM | POA: Diagnosis not present

## 2012-12-01 DIAGNOSIS — Z7901 Long term (current) use of anticoagulants: Secondary | ICD-10-CM | POA: Diagnosis not present

## 2012-12-09 DIAGNOSIS — Z961 Presence of intraocular lens: Secondary | ICD-10-CM | POA: Diagnosis not present

## 2012-12-15 DIAGNOSIS — Z7901 Long term (current) use of anticoagulants: Secondary | ICD-10-CM | POA: Diagnosis not present

## 2012-12-15 DIAGNOSIS — I4891 Unspecified atrial fibrillation: Secondary | ICD-10-CM | POA: Diagnosis not present

## 2013-01-12 DIAGNOSIS — I4891 Unspecified atrial fibrillation: Secondary | ICD-10-CM | POA: Diagnosis not present

## 2013-01-12 DIAGNOSIS — Z7901 Long term (current) use of anticoagulants: Secondary | ICD-10-CM | POA: Diagnosis not present

## 2013-02-16 DIAGNOSIS — I4891 Unspecified atrial fibrillation: Secondary | ICD-10-CM | POA: Diagnosis not present

## 2013-02-16 DIAGNOSIS — Z7901 Long term (current) use of anticoagulants: Secondary | ICD-10-CM | POA: Diagnosis not present

## 2013-02-23 DIAGNOSIS — R351 Nocturia: Secondary | ICD-10-CM | POA: Diagnosis not present

## 2013-02-23 DIAGNOSIS — N3946 Mixed incontinence: Secondary | ICD-10-CM | POA: Diagnosis not present

## 2013-03-02 DIAGNOSIS — I4891 Unspecified atrial fibrillation: Secondary | ICD-10-CM | POA: Diagnosis not present

## 2013-03-02 DIAGNOSIS — Z7901 Long term (current) use of anticoagulants: Secondary | ICD-10-CM | POA: Diagnosis not present

## 2013-03-07 DIAGNOSIS — E0590 Thyrotoxicosis, unspecified without thyrotoxic crisis or storm: Secondary | ICD-10-CM | POA: Diagnosis not present

## 2013-03-08 DIAGNOSIS — L821 Other seborrheic keratosis: Secondary | ICD-10-CM | POA: Diagnosis not present

## 2013-03-08 DIAGNOSIS — I781 Nevus, non-neoplastic: Secondary | ICD-10-CM | POA: Diagnosis not present

## 2013-03-08 DIAGNOSIS — L219 Seborrheic dermatitis, unspecified: Secondary | ICD-10-CM | POA: Diagnosis not present

## 2013-03-08 DIAGNOSIS — D239 Other benign neoplasm of skin, unspecified: Secondary | ICD-10-CM | POA: Diagnosis not present

## 2013-03-08 DIAGNOSIS — Z85828 Personal history of other malignant neoplasm of skin: Secondary | ICD-10-CM | POA: Diagnosis not present

## 2013-03-10 DIAGNOSIS — E0590 Thyrotoxicosis, unspecified without thyrotoxic crisis or storm: Secondary | ICD-10-CM | POA: Diagnosis not present

## 2013-03-18 DIAGNOSIS — I2510 Atherosclerotic heart disease of native coronary artery without angina pectoris: Secondary | ICD-10-CM | POA: Diagnosis not present

## 2013-03-18 DIAGNOSIS — I4891 Unspecified atrial fibrillation: Secondary | ICD-10-CM | POA: Diagnosis not present

## 2013-03-18 DIAGNOSIS — I059 Rheumatic mitral valve disease, unspecified: Secondary | ICD-10-CM | POA: Diagnosis not present

## 2013-03-29 ENCOUNTER — Other Ambulatory Visit: Payer: Self-pay | Admitting: Family Medicine

## 2013-03-29 ENCOUNTER — Ambulatory Visit
Admission: RE | Admit: 2013-03-29 | Discharge: 2013-03-29 | Disposition: A | Discharge status: Home / Self Care | Payer: Medicare Other | Source: Ambulatory Visit | Attending: Family Medicine | Admitting: Family Medicine

## 2013-03-29 DIAGNOSIS — R42 Dizziness and giddiness: Secondary | ICD-10-CM

## 2013-03-29 DIAGNOSIS — R51 Headache: Secondary | ICD-10-CM | POA: Diagnosis not present

## 2013-03-29 DIAGNOSIS — T1490XA Injury, unspecified, initial encounter: Secondary | ICD-10-CM

## 2013-03-29 DIAGNOSIS — R519 Headache, unspecified: Secondary | ICD-10-CM

## 2013-03-29 DIAGNOSIS — Z7901 Long term (current) use of anticoagulants: Secondary | ICD-10-CM | POA: Diagnosis not present

## 2013-03-29 DIAGNOSIS — S0990XA Unspecified injury of head, initial encounter: Secondary | ICD-10-CM | POA: Diagnosis not present

## 2013-03-31 DIAGNOSIS — I4891 Unspecified atrial fibrillation: Secondary | ICD-10-CM | POA: Diagnosis not present

## 2013-03-31 DIAGNOSIS — I059 Rheumatic mitral valve disease, unspecified: Secondary | ICD-10-CM | POA: Diagnosis not present

## 2013-03-31 DIAGNOSIS — I2510 Atherosclerotic heart disease of native coronary artery without angina pectoris: Secondary | ICD-10-CM | POA: Diagnosis not present

## 2013-04-07 DIAGNOSIS — Z7901 Long term (current) use of anticoagulants: Secondary | ICD-10-CM | POA: Diagnosis not present

## 2013-04-07 DIAGNOSIS — I4891 Unspecified atrial fibrillation: Secondary | ICD-10-CM | POA: Diagnosis not present

## 2013-04-22 DIAGNOSIS — Z7901 Long term (current) use of anticoagulants: Secondary | ICD-10-CM | POA: Diagnosis not present

## 2013-04-22 DIAGNOSIS — I4891 Unspecified atrial fibrillation: Secondary | ICD-10-CM | POA: Diagnosis not present

## 2013-05-20 DIAGNOSIS — Z7901 Long term (current) use of anticoagulants: Secondary | ICD-10-CM | POA: Diagnosis not present

## 2013-05-20 DIAGNOSIS — I4891 Unspecified atrial fibrillation: Secondary | ICD-10-CM | POA: Diagnosis not present

## 2013-05-31 DIAGNOSIS — Z23 Encounter for immunization: Secondary | ICD-10-CM | POA: Diagnosis not present

## 2013-05-31 DIAGNOSIS — I10 Essential (primary) hypertension: Secondary | ICD-10-CM | POA: Diagnosis not present

## 2013-05-31 DIAGNOSIS — R569 Unspecified convulsions: Secondary | ICD-10-CM | POA: Diagnosis not present

## 2013-05-31 DIAGNOSIS — E7800 Pure hypercholesterolemia, unspecified: Secondary | ICD-10-CM | POA: Diagnosis not present

## 2013-06-10 DIAGNOSIS — E0590 Thyrotoxicosis, unspecified without thyrotoxic crisis or storm: Secondary | ICD-10-CM | POA: Diagnosis not present

## 2013-06-16 DIAGNOSIS — I4891 Unspecified atrial fibrillation: Secondary | ICD-10-CM | POA: Diagnosis not present

## 2013-06-16 DIAGNOSIS — Z7901 Long term (current) use of anticoagulants: Secondary | ICD-10-CM | POA: Diagnosis not present

## 2013-07-06 ENCOUNTER — Encounter (INDEPENDENT_AMBULATORY_CARE_PROVIDER_SITE_OTHER): Payer: Self-pay

## 2013-07-06 ENCOUNTER — Encounter: Payer: Self-pay | Admitting: Internal Medicine

## 2013-07-06 ENCOUNTER — Ambulatory Visit (INDEPENDENT_AMBULATORY_CARE_PROVIDER_SITE_OTHER): Payer: Medicare Other | Admitting: Internal Medicine

## 2013-07-06 VITALS — BP 110/48 | HR 71 | Ht 66.0 in | Wt 116.4 lb

## 2013-07-06 DIAGNOSIS — Z950 Presence of cardiac pacemaker: Secondary | ICD-10-CM

## 2013-07-06 DIAGNOSIS — I495 Sick sinus syndrome: Secondary | ICD-10-CM

## 2013-07-06 DIAGNOSIS — I4891 Unspecified atrial fibrillation: Secondary | ICD-10-CM | POA: Diagnosis not present

## 2013-07-06 LAB — PACEMAKER DEVICE OBSERVATION
BRDY-0002RV: 60 {beats}/min
BRDY-0003RV: 110 {beats}/min
DEVICE MODEL PM: 111635
RV LEAD THRESHOLD: 0.7 V

## 2013-07-06 NOTE — Patient Instructions (Addendum)
Your physician wants you to follow-up in: 12 months with  Eddie Park You will receive a reminder letter in the mail two months in advance. If you don't receive a letter, please call our office to schedule the follow-up appointment.   Remote monitoring is used to monitor your Pacemaker or ICD from home. This monitoring reduces the number of office visits required to check your device to one time per year. It allows [place] to keep an eye on the functioning of your device to ensure it is working properly. You are scheduled for a device check from home on 10/06/13. You may send your transmission at any time that day. If you have a wireless device, the transmission will be sent automatically. After your physician reviews your transmission, you will receive a postcard with your next transmission date.

## 2013-07-06 NOTE — Progress Notes (Signed)
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  Since his last visit, the patient reports doing very well.  Today, he denies symptoms of palpitations, chest pain, shortness of breath,  lower extremity edema, dizziness, presyncope, or syncope.  The patient is otherwise without complaint today.   Past Medical History  Diagnosis Date  . Coronary artery disease     s/p MI 1995  . Prostate cancer 2002  . Persistent atrial fibrillation   . Tachycardia-bradycardia syndrome     with syncope, s/p PPM by Dr Leonia Reeves  . Hypertension   . Seizure disorder   . Anxiety   . Mitral valve regurgitation    Past Surgical History  Procedure Laterality Date  . Prostate surgery    . Pacemaker insertion  04/29/06    Generator change (BSc) by Dr Rayann Heman 06/21/12  . Artificial urinary sphincter      Current Outpatient Prescriptions  Medication Sig Dispense Refill  . cholecalciferol (VITAMIN D) 1000 UNITS tablet Take 2,000 Units by mouth daily.      . fish oil-omega-3 fatty acids 1000 MG capsule Take 2 g by mouth daily.      [name] lisinopril (PRINIVIL,ZESTRIL) 20 MG tablet Take 1/2 tablet daliy      . methimazole (TAPAZOLE) 5 MG tablet Take 5 mg by mouth daily.      . metoprolol succinate (TOPROL-XL) 50 MG 24 hr tablet Take 1/2 tablet daily with or immediately following a meal.      . Multiple Vitamin (MULTIVITAMIN WITH MINERALS) TABS Take 1 tablet by mouth daily.      [name] PHENobarbital (LUMINAL) 97.2 MG tablet Take 97.2 mg by mouth at bedtime.      . sertraline (ZOLOFT) 100 MG tablet Take 100 mg by mouth daily.      . vitamin B-12 (CYANOCOBALAMIN) 1000 MCG tablet Take 1,000 mcg by mouth daily.      [name] warfarin (COUMADIN) 5 MG tablet Take 5-7.5 mg by mouth daily. 1.5 (=7.5mg ) tab on MWF, 1 (=5mg ) tab daily all other days.       No current facility-administered medications for this visit.    Physical Exam: Filed Vitals:   07/06/13  0944  BP: 110/48  Pulse: 71  Height: 5\' 6"  (1.676 m)  Weight: 116 lb 6.4 oz (52.799 kg)    GEN- The patient is well appearing, alert and oriented x 3 today.   Head- normocephalic, atraumatic Eyes-  Sclera clear, conjunctiva pink Ears- hearing intact Oropharynx- clear Lungs- Clear to ausculation bilaterally, normal work of breathing Chest- pacemaker pocket is well healed Heart- Regular rate and rhythm, no murmurs, rubs or gallops, PMI not laterally displaced GI- soft, NT, ND, + BS Extremities- no clubbing, cyanosis, or edema  Pacemaker interrogation- reviewed in detail today,  See PACEART report  Assessment and Plan:  1. Bradycardia/ tachycardia Normal pacemaker function See Pace Art report No changes today  2. afib Continue coumadin long term  [place]itude every 3 months Return to see Jerene Pitch in the device clinic in 1 year

## 2013-07-14 ENCOUNTER — Encounter: Payer: Self-pay | Admitting: Internal Medicine

## 2013-07-21 ENCOUNTER — Ambulatory Visit (INDEPENDENT_AMBULATORY_CARE_PROVIDER_SITE_OTHER): Payer: Medicare Other | Admitting: *Deleted

## 2013-07-21 DIAGNOSIS — Z7901 Long term (current) use of anticoagulants: Secondary | ICD-10-CM | POA: Diagnosis not present

## 2013-07-21 DIAGNOSIS — I4891 Unspecified atrial fibrillation: Secondary | ICD-10-CM

## 2013-08-25 ENCOUNTER — Ambulatory Visit (INDEPENDENT_AMBULATORY_CARE_PROVIDER_SITE_OTHER): Payer: Medicare Other | Admitting: Pharmacist

## 2013-08-25 DIAGNOSIS — I4891 Unspecified atrial fibrillation: Secondary | ICD-10-CM

## 2013-08-25 DIAGNOSIS — Z7901 Long term (current) use of anticoagulants: Secondary | ICD-10-CM

## 2013-09-02 DIAGNOSIS — E0590 Thyrotoxicosis, unspecified without thyrotoxic crisis or storm: Secondary | ICD-10-CM | POA: Diagnosis not present

## 2013-09-08 DIAGNOSIS — E0590 Thyrotoxicosis, unspecified without thyrotoxic crisis or storm: Secondary | ICD-10-CM | POA: Diagnosis not present

## 2013-09-08 DIAGNOSIS — E538 Deficiency of other specified B group vitamins: Secondary | ICD-10-CM | POA: Diagnosis not present

## 2013-09-20 ENCOUNTER — Ambulatory Visit (INDEPENDENT_AMBULATORY_CARE_PROVIDER_SITE_OTHER): Payer: Medicare Other | Admitting: Pharmacist

## 2013-09-20 DIAGNOSIS — Z5181 Encounter for therapeutic drug level monitoring: Secondary | ICD-10-CM

## 2013-09-20 DIAGNOSIS — I4891 Unspecified atrial fibrillation: Secondary | ICD-10-CM

## 2013-09-20 DIAGNOSIS — Z7901 Long term (current) use of anticoagulants: Secondary | ICD-10-CM | POA: Diagnosis not present

## 2013-09-20 LAB — POCT INR: INR: 2.5

## 2013-10-06 ENCOUNTER — Encounter: Payer: Self-pay | Admitting: Internal Medicine

## 2013-10-06 ENCOUNTER — Encounter: Payer: Medicare Other | Admitting: *Deleted

## 2013-10-06 DIAGNOSIS — Z950 Presence of cardiac pacemaker: Secondary | ICD-10-CM

## 2013-10-06 DIAGNOSIS — I495 Sick sinus syndrome: Secondary | ICD-10-CM

## 2013-10-06 LAB — MDC_IDC_ENUM_SESS_TYPE_REMOTE
Implantable Pulse Generator Serial Number: 111635
Lead Channel Impedance Value: 378 Ohm
Lead Channel Impedance Value: 398 Ohm
Lead Channel Sensing Intrinsic Amplitude: 1.1 mV
Lead Channel Sensing Intrinsic Amplitude: 19.6 mV
MDC IDC STAT BRADY RV PERCENT PACED: 94 %

## 2013-10-17 ENCOUNTER — Encounter: Payer: Self-pay | Admitting: *Deleted

## 2013-10-19 ENCOUNTER — Ambulatory Visit (INDEPENDENT_AMBULATORY_CARE_PROVIDER_SITE_OTHER): Payer: Medicare Other | Admitting: *Deleted

## 2013-10-19 DIAGNOSIS — Z7901 Long term (current) use of anticoagulants: Secondary | ICD-10-CM | POA: Diagnosis not present

## 2013-10-19 DIAGNOSIS — Z5181 Encounter for therapeutic drug level monitoring: Secondary | ICD-10-CM | POA: Diagnosis not present

## 2013-10-19 DIAGNOSIS — I4891 Unspecified atrial fibrillation: Secondary | ICD-10-CM

## 2013-10-19 LAB — POCT INR: INR: 3

## 2013-11-16 ENCOUNTER — Ambulatory Visit (INDEPENDENT_AMBULATORY_CARE_PROVIDER_SITE_OTHER): Payer: Medicare Other | Admitting: Pharmacist

## 2013-11-16 DIAGNOSIS — I4891 Unspecified atrial fibrillation: Secondary | ICD-10-CM

## 2013-11-16 DIAGNOSIS — Z7901 Long term (current) use of anticoagulants: Secondary | ICD-10-CM | POA: Diagnosis not present

## 2013-11-16 DIAGNOSIS — Z5181 Encounter for therapeutic drug level monitoring: Secondary | ICD-10-CM | POA: Diagnosis not present

## 2013-11-16 LAB — POCT INR: INR: 3.1

## 2013-11-30 DIAGNOSIS — N183 Chronic kidney disease, stage 3 unspecified: Secondary | ICD-10-CM | POA: Diagnosis not present

## 2013-11-30 DIAGNOSIS — E7800 Pure hypercholesterolemia, unspecified: Secondary | ICD-10-CM | POA: Diagnosis not present

## 2013-11-30 DIAGNOSIS — I10 Essential (primary) hypertension: Secondary | ICD-10-CM | POA: Diagnosis not present

## 2013-11-30 DIAGNOSIS — Z79899 Other long term (current) drug therapy: Secondary | ICD-10-CM | POA: Diagnosis not present

## 2013-11-30 DIAGNOSIS — G40909 Epilepsy, unspecified, not intractable, without status epilepticus: Secondary | ICD-10-CM | POA: Diagnosis not present

## 2013-11-30 DIAGNOSIS — Z1331 Encounter for screening for depression: Secondary | ICD-10-CM | POA: Diagnosis not present

## 2013-11-30 DIAGNOSIS — Z8659 Personal history of other mental and behavioral disorders: Secondary | ICD-10-CM | POA: Diagnosis not present

## 2013-12-07 DIAGNOSIS — E0590 Thyrotoxicosis, unspecified without thyrotoxic crisis or storm: Secondary | ICD-10-CM | POA: Diagnosis not present

## 2013-12-07 DIAGNOSIS — E538 Deficiency of other specified B group vitamins: Secondary | ICD-10-CM | POA: Diagnosis not present

## 2013-12-12 ENCOUNTER — Other Ambulatory Visit: Payer: Self-pay | Admitting: Interventional Cardiology

## 2013-12-15 ENCOUNTER — Ambulatory Visit: Payer: Medicare Other | Admitting: Interventional Cardiology

## 2013-12-15 ENCOUNTER — Ambulatory Visit (INDEPENDENT_AMBULATORY_CARE_PROVIDER_SITE_OTHER): Payer: Medicare Other | Admitting: Pharmacist Clinician (PhC)/ Clinical Pharmacy Specialist

## 2013-12-15 DIAGNOSIS — I4891 Unspecified atrial fibrillation: Secondary | ICD-10-CM

## 2013-12-15 DIAGNOSIS — Z7901 Long term (current) use of anticoagulants: Secondary | ICD-10-CM

## 2013-12-15 DIAGNOSIS — Z5181 Encounter for therapeutic drug level monitoring: Secondary | ICD-10-CM | POA: Diagnosis not present

## 2013-12-15 LAB — POCT INR: INR: 2.1

## 2013-12-22 DIAGNOSIS — Z961 Presence of intraocular lens: Secondary | ICD-10-CM | POA: Diagnosis not present

## 2014-01-04 DIAGNOSIS — H26499 Other secondary cataract, unspecified eye: Secondary | ICD-10-CM | POA: Diagnosis not present

## 2014-01-11 ENCOUNTER — Ambulatory Visit (INDEPENDENT_AMBULATORY_CARE_PROVIDER_SITE_OTHER): Payer: Medicare Other | Admitting: *Deleted

## 2014-01-11 DIAGNOSIS — Z7901 Long term (current) use of anticoagulants: Secondary | ICD-10-CM

## 2014-01-11 DIAGNOSIS — I4891 Unspecified atrial fibrillation: Secondary | ICD-10-CM

## 2014-01-11 DIAGNOSIS — Z5181 Encounter for therapeutic drug level monitoring: Secondary | ICD-10-CM | POA: Diagnosis not present

## 2014-01-11 DIAGNOSIS — E0590 Thyrotoxicosis, unspecified without thyrotoxic crisis or storm: Secondary | ICD-10-CM | POA: Diagnosis not present

## 2014-01-11 LAB — POCT INR: INR: 3.3

## 2014-01-12 ENCOUNTER — Encounter: Payer: Self-pay | Admitting: Internal Medicine

## 2014-01-12 ENCOUNTER — Ambulatory Visit (INDEPENDENT_AMBULATORY_CARE_PROVIDER_SITE_OTHER): Payer: Medicare Other | Admitting: *Deleted

## 2014-01-12 DIAGNOSIS — I495 Sick sinus syndrome: Secondary | ICD-10-CM | POA: Diagnosis not present

## 2014-01-15 LAB — MDC_IDC_ENUM_SESS_TYPE_REMOTE
Implantable Pulse Generator Serial Number: 111635
Lead Channel Impedance Value: 389 Ohm
MDC IDC MSMT BATTERY REMAINING LONGEVITY: 96 mo
MDC IDC MSMT LEADCHNL RA SENSING INTR AMPL: 0.8 mV
MDC IDC MSMT LEADCHNL RV IMPEDANCE VALUE: 372 Ohm
MDC IDC STAT BRADY RA PERCENT PACED: 0 %
MDC IDC STAT BRADY RV PERCENT PACED: 97 %

## 2014-01-18 DIAGNOSIS — H26499 Other secondary cataract, unspecified eye: Secondary | ICD-10-CM | POA: Diagnosis not present

## 2014-01-23 NOTE — Progress Notes (Signed)
PPM remote 

## 2014-01-25 ENCOUNTER — Encounter: Payer: Self-pay | Admitting: Interventional Cardiology

## 2014-01-25 ENCOUNTER — Ambulatory Visit (INDEPENDENT_AMBULATORY_CARE_PROVIDER_SITE_OTHER): Payer: Medicare Other | Admitting: Interventional Cardiology

## 2014-01-25 ENCOUNTER — Ambulatory Visit (INDEPENDENT_AMBULATORY_CARE_PROVIDER_SITE_OTHER): Payer: Medicare Other | Admitting: Pharmacist

## 2014-01-25 VITALS — BP 115/49 | HR 59 | Ht 66.0 in | Wt 117.2 lb

## 2014-01-25 DIAGNOSIS — I4891 Unspecified atrial fibrillation: Secondary | ICD-10-CM

## 2014-01-25 DIAGNOSIS — I2510 Atherosclerotic heart disease of native coronary artery without angina pectoris: Secondary | ICD-10-CM | POA: Insufficient documentation

## 2014-01-25 DIAGNOSIS — I059 Rheumatic mitral valve disease, unspecified: Secondary | ICD-10-CM

## 2014-01-25 DIAGNOSIS — Z7901 Long term (current) use of anticoagulants: Secondary | ICD-10-CM | POA: Diagnosis not present

## 2014-01-25 DIAGNOSIS — I359 Nonrheumatic aortic valve disorder, unspecified: Secondary | ICD-10-CM | POA: Diagnosis not present

## 2014-01-25 DIAGNOSIS — Z5181 Encounter for therapeutic drug level monitoring: Secondary | ICD-10-CM

## 2014-01-25 DIAGNOSIS — I340 Nonrheumatic mitral (valve) insufficiency: Secondary | ICD-10-CM | POA: Insufficient documentation

## 2014-01-25 DIAGNOSIS — I351 Nonrheumatic aortic (valve) insufficiency: Secondary | ICD-10-CM | POA: Insufficient documentation

## 2014-01-25 LAB — POCT INR: INR: 2.3

## 2014-01-25 NOTE — Progress Notes (Signed)
Patient ID: Eddie Park, male   DOB: 25-Sep-1926, 80 y.o.   MRN: [id number]    [address], [place]  [address] Phone: [phone] Fax:  [phone]  Date:  01/25/2014   ID:  Eddie Park, DOB 02/22/1927, MRN [id number]  PCP:  Gerrit Heck, MD      History of Present Illness: Eddie Park is a 80 y.o. male with AFib, mitral regurgitation, cardiomyopathy, and pacer dependent. He is now in AFib 100% of the time by pacer check. EF 45% in 2011. Some arm pain at times.  Atrial Fibrillation F/U:  c/o Dizziness while getting up from sitting position.  Denies : Chest pain.  Leg edema.  Orthopnea.  Palpitations.  Shortness of breath.  Syncope.  some walking, daily. No bleeding problems.    Wt Readings from Last 3 Encounters:  01/25/14 117 lb 3.2 oz (53.162 kg)  07/06/13 116 lb 6.4 oz (52.799 kg)  10/06/12 113 lb (51.256 kg)     Past Medical History  Diagnosis Date  . Coronary artery disease     s/p MI 1995  . Prostate cancer 2002  . Persistent atrial fibrillation   . Tachycardia-bradycardia syndrome     with syncope, s/p PPM by Dr Leonia Reeves  . Hypertension   . Seizure disorder   . Anxiety   . Mitral valve regurgitation     Current Outpatient Prescriptions  Medication Sig Dispense Refill  . cholecalciferol (VITAMIN D) 1000 UNITS tablet Take 2,000 Units by mouth daily.      . fish oil-omega-3 fatty acids 1000 MG capsule Take 2 g by mouth daily.      [name] lisinopril (PRINIVIL,ZESTRIL) 20 MG tablet Take 1/2 tablet daliy      . metoprolol succinate (TOPROL-XL) 50 MG 24 hr tablet Take 1/2 tablet daily with or immediately following a meal.      . Multiple Vitamin (MULTIVITAMIN WITH MINERALS) TABS Take 1 tablet by mouth daily.      [name] PHENobarbital (LUMINAL) 97.2 MG tablet Take 97.2 mg by mouth at bedtime.      . sertraline (ZOLOFT) 100 MG tablet Take 100 mg by mouth daily.      . vitamin B-12 (CYANOCOBALAMIN) 1000 MCG tablet Take 1,000 mcg  by mouth daily.      [name] warfarin (COUMADIN) 5 MG tablet 1.5 tablets everyday except 2 tablets on Mondays and Fridays or as directed  180 tablet  1   No current facility-administered medications for this visit.    Allergies:   No Known Allergies  Social History:  The patient  reports that he has never smoked. He does not have any smokeless tobacco history on file. He reports that he does not drink alcohol or use illicit drugs.   Family History:  The patient's family history includes Coronary artery disease in an other family member.   ROS:  Please see the history of present illness.  No nausea, vomiting.  No fevers, chills.  No focal weakness.  No dysuria.   All other systems reviewed and negative.   PHYSICAL EXAM: VS:  BP 115/49  Pulse 59  Ht 5\' 6"  (1.676 m)  Wt 117 lb 3.2 oz (53.162 kg)  BMI 18.93 kg/m2 Well nourished, well developed, in no acute distress HEENT: normal Neck: no JVD, no carotid bruits Cardiac:  normal S1, S2; Irregularly irregular Lungs:  clear to auscultation bilaterally, no wheezing, rhonchi or rales Abd: soft, nontender, no hepatomegaly Ext: no edema Skin:  warm and dry Neuro:   no focal abnormalities noted       ASSESSMENT AND PLAN:  Coronary atherosclerosis of unspecified type of vessel, native or graft  Notes: No angina. MI in 1995.  2. Mitral Regurgitation    severe MR/ severe AI.  Medical therapy only.  Discussed at length with the patient and wife.  He would not be a surgical candidate.  Would  not pursue any further w/u    Notes: moderate to severe in the past with mildly decreased LV function.  He will let us know if he has any sx of fluid overload.   3. Atrial fibrillation  Notes: Permanent AFib.  Paced much of the time. Coumadin for stroke prevention.  No bleeding issues.    4.  Wife states that his activity level is stable.  No obvious SHOB.   Signed, Mina Marble, MD, [facility] 01/25/2014 11:59 AM

## 2014-01-25 NOTE — Patient Instructions (Signed)
Your physician recommends that you continue on your current medications as directed. Please refer to the Current Medication list given to you today.  Your physician wants you to follow-up in: 1 year with Dr. Varanasi. You will receive a reminder letter in the mail two months in advance. If you don't receive a letter, please call our office to schedule the follow-up appointment.  

## 2014-02-09 ENCOUNTER — Encounter: Payer: Self-pay | Admitting: Cardiology

## 2014-02-22 ENCOUNTER — Ambulatory Visit (INDEPENDENT_AMBULATORY_CARE_PROVIDER_SITE_OTHER): Payer: Medicare Other | Admitting: *Deleted

## 2014-02-22 DIAGNOSIS — Z7901 Long term (current) use of anticoagulants: Secondary | ICD-10-CM

## 2014-02-22 DIAGNOSIS — Z5181 Encounter for therapeutic drug level monitoring: Secondary | ICD-10-CM | POA: Diagnosis not present

## 2014-02-22 DIAGNOSIS — I4891 Unspecified atrial fibrillation: Secondary | ICD-10-CM | POA: Diagnosis not present

## 2014-02-22 LAB — POCT INR: INR: 1.9

## 2014-02-27 ENCOUNTER — Other Ambulatory Visit: Payer: Self-pay | Admitting: Interventional Cardiology

## 2014-03-01 DIAGNOSIS — G40909 Epilepsy, unspecified, not intractable, without status epilepticus: Secondary | ICD-10-CM | POA: Diagnosis not present

## 2014-03-01 DIAGNOSIS — Z8659 Personal history of other mental and behavioral disorders: Secondary | ICD-10-CM | POA: Diagnosis not present

## 2014-03-01 DIAGNOSIS — N183 Chronic kidney disease, stage 3 unspecified: Secondary | ICD-10-CM | POA: Diagnosis not present

## 2014-03-01 DIAGNOSIS — Z79899 Other long term (current) drug therapy: Secondary | ICD-10-CM | POA: Diagnosis not present

## 2014-03-01 DIAGNOSIS — E7800 Pure hypercholesterolemia, unspecified: Secondary | ICD-10-CM | POA: Diagnosis not present

## 2014-03-01 DIAGNOSIS — I10 Essential (primary) hypertension: Secondary | ICD-10-CM | POA: Diagnosis not present

## 2014-03-01 DIAGNOSIS — Z1331 Encounter for screening for depression: Secondary | ICD-10-CM | POA: Diagnosis not present

## 2014-03-22 ENCOUNTER — Ambulatory Visit (INDEPENDENT_AMBULATORY_CARE_PROVIDER_SITE_OTHER): Payer: Medicare Other | Admitting: *Deleted

## 2014-03-22 DIAGNOSIS — Z7901 Long term (current) use of anticoagulants: Secondary | ICD-10-CM

## 2014-03-22 DIAGNOSIS — I4891 Unspecified atrial fibrillation: Secondary | ICD-10-CM

## 2014-03-22 DIAGNOSIS — Z5181 Encounter for therapeutic drug level monitoring: Secondary | ICD-10-CM | POA: Diagnosis not present

## 2014-03-22 LAB — POCT INR: INR: 2.3

## 2014-04-04 DIAGNOSIS — L821 Other seborrheic keratosis: Secondary | ICD-10-CM | POA: Diagnosis not present

## 2014-04-04 DIAGNOSIS — Z85828 Personal history of other malignant neoplasm of skin: Secondary | ICD-10-CM | POA: Diagnosis not present

## 2014-04-04 DIAGNOSIS — D239 Other benign neoplasm of skin, unspecified: Secondary | ICD-10-CM | POA: Diagnosis not present

## 2014-04-04 DIAGNOSIS — I781 Nevus, non-neoplastic: Secondary | ICD-10-CM | POA: Diagnosis not present

## 2014-04-04 DIAGNOSIS — D485 Neoplasm of uncertain behavior of skin: Secondary | ICD-10-CM | POA: Diagnosis not present

## 2014-04-17 ENCOUNTER — Ambulatory Visit (INDEPENDENT_AMBULATORY_CARE_PROVIDER_SITE_OTHER): Payer: Medicare Other | Admitting: *Deleted

## 2014-04-17 DIAGNOSIS — Z950 Presence of cardiac pacemaker: Secondary | ICD-10-CM

## 2014-04-17 DIAGNOSIS — I495 Sick sinus syndrome: Secondary | ICD-10-CM

## 2014-04-17 LAB — MDC_IDC_ENUM_SESS_TYPE_REMOTE
Brady Statistic RA Percent Paced: 0 %
Lead Channel Impedance Value: 376 Ohm
Lead Channel Sensing Intrinsic Amplitude: 0.3 mV
Lead Channel Sensing Intrinsic Amplitude: 11.1 mV
Lead Channel Setting Pacing Amplitude: 2.5 V
Lead Channel Setting Pacing Amplitude: 2.5 V
MDC IDC MSMT LEADCHNL RV IMPEDANCE VALUE: 361 Ohm
MDC IDC PG SERIAL: 111635
MDC IDC SET LEADCHNL RV PACING PULSEWIDTH: 0.4 ms
MDC IDC SET LEADCHNL RV SENSING SENSITIVITY: 2.5 mV
MDC IDC STAT BRADY RV PERCENT PACED: 96 %

## 2014-04-17 NOTE — Progress Notes (Signed)
Remote pacemaker transmission.   

## 2014-04-18 DIAGNOSIS — E0590 Thyrotoxicosis, unspecified without thyrotoxic crisis or storm: Secondary | ICD-10-CM | POA: Diagnosis not present

## 2014-04-19 ENCOUNTER — Ambulatory Visit (INDEPENDENT_AMBULATORY_CARE_PROVIDER_SITE_OTHER): Payer: Medicare Other | Admitting: Pharmacist

## 2014-04-19 DIAGNOSIS — I4891 Unspecified atrial fibrillation: Secondary | ICD-10-CM

## 2014-04-19 DIAGNOSIS — Z7901 Long term (current) use of anticoagulants: Secondary | ICD-10-CM

## 2014-04-19 DIAGNOSIS — Z5181 Encounter for therapeutic drug level monitoring: Secondary | ICD-10-CM

## 2014-04-19 LAB — POCT INR: INR: 2.4

## 2014-04-27 ENCOUNTER — Encounter: Payer: Self-pay | Admitting: Cardiology

## 2014-05-08 ENCOUNTER — Encounter: Payer: Self-pay | Admitting: Internal Medicine

## 2014-05-17 ENCOUNTER — Ambulatory Visit (INDEPENDENT_AMBULATORY_CARE_PROVIDER_SITE_OTHER): Payer: Medicare Other | Admitting: Pharmacist

## 2014-05-17 DIAGNOSIS — Z5181 Encounter for therapeutic drug level monitoring: Secondary | ICD-10-CM

## 2014-05-17 DIAGNOSIS — Z7901 Long term (current) use of anticoagulants: Secondary | ICD-10-CM | POA: Diagnosis not present

## 2014-05-17 DIAGNOSIS — I4891 Unspecified atrial fibrillation: Secondary | ICD-10-CM

## 2014-05-17 LAB — POCT INR: INR: 2.6

## 2014-06-28 ENCOUNTER — Ambulatory Visit (INDEPENDENT_AMBULATORY_CARE_PROVIDER_SITE_OTHER): Payer: Medicare Other | Admitting: *Deleted

## 2014-06-28 DIAGNOSIS — Z5181 Encounter for therapeutic drug level monitoring: Secondary | ICD-10-CM

## 2014-06-28 DIAGNOSIS — Z7901 Long term (current) use of anticoagulants: Secondary | ICD-10-CM

## 2014-06-28 DIAGNOSIS — I4891 Unspecified atrial fibrillation: Secondary | ICD-10-CM

## 2014-06-28 LAB — POCT INR: INR: 2.3

## 2014-07-05 DIAGNOSIS — Z23 Encounter for immunization: Secondary | ICD-10-CM | POA: Diagnosis not present

## 2014-07-10 ENCOUNTER — Encounter: Payer: Self-pay | Admitting: Internal Medicine

## 2014-07-10 ENCOUNTER — Ambulatory Visit (INDEPENDENT_AMBULATORY_CARE_PROVIDER_SITE_OTHER): Payer: Medicare Other | Admitting: Internal Medicine

## 2014-07-10 VITALS — BP 110/52 | HR 69 | Ht 66.0 in | Wt 117.4 lb

## 2014-07-10 DIAGNOSIS — I481 Persistent atrial fibrillation: Secondary | ICD-10-CM | POA: Diagnosis not present

## 2014-07-10 DIAGNOSIS — I495 Sick sinus syndrome: Secondary | ICD-10-CM | POA: Diagnosis not present

## 2014-07-10 DIAGNOSIS — Z7901 Long term (current) use of anticoagulants: Secondary | ICD-10-CM

## 2014-07-10 DIAGNOSIS — I2510 Atherosclerotic heart disease of native coronary artery without angina pectoris: Secondary | ICD-10-CM

## 2014-07-10 DIAGNOSIS — Z950 Presence of cardiac pacemaker: Secondary | ICD-10-CM | POA: Diagnosis not present

## 2014-07-10 DIAGNOSIS — I4819 Other persistent atrial fibrillation: Secondary | ICD-10-CM

## 2014-07-10 NOTE — Patient Instructions (Signed)
Remote monitoring is used to monitor your pacemaker from home. This monitoring reduces the number of office visits required to check your device to one time per year. It allows [place] to keep an eye on the functioning of your device to ensure it is working properly. You are scheduled for a device check from home on 10/11/2014. You may send your transmission at any time that day. If you have a wireless device, the transmission will be sent automatically. After your physician reviews your transmission, you will receive a postcard with your next transmission date.  Your physician recommends that you schedule a follow-up appointment in: 12 months with Dr.Allred

## 2014-07-10 NOTE — Progress Notes (Signed)
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  Since his last visit, the patient reports doing reasonably well.  Today, he denies symptoms of palpitations, chest pain, shortness of breath,  lower extremity edema, dizziness, presyncope, or syncope.  The patient is otherwise without complaint today.   Past Medical History  Diagnosis Date  . Coronary artery disease     s/p MI 1995  . Prostate cancer 2002  . Persistent atrial fibrillation   . Tachycardia-bradycardia syndrome     with syncope, s/p PPM by Dr Leonia Reeves  . Hypertension   . Seizure disorder   . Anxiety   . Mitral valve regurgitation    Past Surgical History  Procedure Laterality Date  . Prostate surgery    . Pacemaker insertion  04/29/06    Generator change (BSc) by Dr Rayann Heman 06/21/12  . Artificial urinary sphincter      Current Outpatient Prescriptions  Medication Sig Dispense Refill  . cholecalciferol (VITAMIN D) 1000 UNITS tablet Take 2,000 Units by mouth daily.      . fish oil-omega-3 fatty acids 1000 MG capsule Take 2 g by mouth daily.      [name] lisinopril (PRINIVIL,ZESTRIL) 20 MG tablet Take 1/2 tablet daliy      . metoprolol succinate (TOPROL-XL) 50 MG 24 hr tablet TAKE 1/2 TABLET BY MOUTH ONCE DAILY  45 tablet  2  . Multiple Vitamin (MULTIVITAMIN WITH MINERALS) TABS Take 1 tablet by mouth daily.      [name] PHENobarbital (LUMINAL) 97.2 MG tablet Take 97.2 mg by mouth at bedtime.      . sertraline (ZOLOFT) 100 MG tablet Take 100 mg by mouth daily.      . vitamin B-12 (CYANOCOBALAMIN) 1000 MCG tablet Take 1,000 mcg by mouth daily.      [name] warfarin (COUMADIN) 5 MG tablet 1.5 tablets everyday except 2 tablets on Mondays and Fridays or as directed  180 tablet  1   No current facility-administered medications for this visit.    Physical Exam: Filed Vitals:   07/10/14 1528  BP: 110/52  Pulse: 69  Height: 5\' 6"  (1.676 m)  Weight: 117 lb 6.4 oz  (53.252 kg)    GEN- The patient is well appearing, alert and oriented x 3 today.   Head- normocephalic, atraumatic Eyes-  Sclera clear, conjunctiva pink Ears- hearing intact Oropharynx- clear Lungs- Clear to ausculation bilaterally, normal work of breathing Chest- pacemaker pocket is well healed Heart- irregular rate and rhythm, 2/6 SEM LSB GI- soft, NT, ND, + BS Extremities- no clubbing, cyanosis, or edema  Pacemaker interrogation- reviewed in detail today,  See PACEART report  Assessment and Plan:  1. Bradycardia/ tachycardia Normal pacemaker function See Pace Art report Reprogrammed VVIR today  2. Permanent afib Continue coumadin long term  Lattitude every 3 months Return to see me or my NP in the device clinic in 1 year Follow-up with Dr Irish Lack as scheduled.

## 2014-07-13 DIAGNOSIS — E0590 Thyrotoxicosis, unspecified without thyrotoxic crisis or storm: Secondary | ICD-10-CM | POA: Diagnosis not present

## 2014-07-18 ENCOUNTER — Other Ambulatory Visit: Payer: Self-pay | Admitting: Interventional Cardiology

## 2014-08-09 ENCOUNTER — Encounter: Payer: Self-pay | Admitting: Internal Medicine

## 2014-08-09 ENCOUNTER — Ambulatory Visit: Payer: Medicare Other | Admitting: *Deleted

## 2014-08-09 ENCOUNTER — Ambulatory Visit (INDEPENDENT_AMBULATORY_CARE_PROVIDER_SITE_OTHER): Payer: Medicare Other | Admitting: Pharmacist

## 2014-08-09 DIAGNOSIS — I4891 Unspecified atrial fibrillation: Secondary | ICD-10-CM | POA: Diagnosis not present

## 2014-08-09 DIAGNOSIS — I482 Chronic atrial fibrillation, unspecified: Secondary | ICD-10-CM

## 2014-08-09 DIAGNOSIS — Z5181 Encounter for therapeutic drug level monitoring: Secondary | ICD-10-CM

## 2014-08-09 DIAGNOSIS — I495 Sick sinus syndrome: Secondary | ICD-10-CM

## 2014-08-09 DIAGNOSIS — Z7901 Long term (current) use of anticoagulants: Secondary | ICD-10-CM

## 2014-08-09 LAB — MDC_IDC_ENUM_SESS_TYPE_INCLINIC
Battery Remaining Longevity: 8
Date Time Interrogation Session: 20151118050000
Implantable Pulse Generator Serial Number: 111635
Lead Channel Setting Pacing Pulse Width: 0.4 ms
Lead Channel Setting Sensing Sensitivity: 2.5 mV
MDC IDC MSMT LEADCHNL RA IMPEDANCE VALUE: 377 Ohm
MDC IDC SET LEADCHNL RV PACING AMPLITUDE: 2.4 V
Zone Setting Detection Interval: 375 ms

## 2014-08-09 LAB — POCT INR: INR: 2.3

## 2014-08-10 NOTE — Progress Notes (Signed)
Pacemaker check in clinic to change mode from DDI to VVIR (N/C). No episodes recorded since last session. Patient to follow up as scheduled.

## 2014-08-31 ENCOUNTER — Encounter (HOSPITAL_COMMUNITY): Payer: Self-pay | Admitting: Internal Medicine

## 2014-09-19 ENCOUNTER — Ambulatory Visit (INDEPENDENT_AMBULATORY_CARE_PROVIDER_SITE_OTHER): Payer: Medicare Other | Admitting: *Deleted

## 2014-09-19 DIAGNOSIS — I4891 Unspecified atrial fibrillation: Secondary | ICD-10-CM | POA: Diagnosis not present

## 2014-09-19 DIAGNOSIS — Z7901 Long term (current) use of anticoagulants: Secondary | ICD-10-CM

## 2014-09-19 DIAGNOSIS — I482 Chronic atrial fibrillation, unspecified: Secondary | ICD-10-CM

## 2014-09-19 DIAGNOSIS — Z5181 Encounter for therapeutic drug level monitoring: Secondary | ICD-10-CM

## 2014-09-19 LAB — POCT INR: INR: 2.3

## 2014-09-27 ENCOUNTER — Other Ambulatory Visit: Payer: Self-pay | Admitting: Interventional Cardiology

## 2014-10-11 ENCOUNTER — Ambulatory Visit (INDEPENDENT_AMBULATORY_CARE_PROVIDER_SITE_OTHER): Payer: Medicare Other | Admitting: *Deleted

## 2014-10-11 ENCOUNTER — Telehealth: Payer: Self-pay | Admitting: Cardiology

## 2014-10-11 DIAGNOSIS — I495 Sick sinus syndrome: Secondary | ICD-10-CM

## 2014-10-11 LAB — MDC_IDC_ENUM_SESS_TYPE_REMOTE
Battery Remaining Percentage: 100 %
Brady Statistic RA Percent Paced: 0 %
Brady Statistic RV Percent Paced: 98 %
Implantable Pulse Generator Serial Number: 111635
Lead Channel Setting Pacing Amplitude: 2.4 V
Lead Channel Setting Sensing Sensitivity: 2.5 mV
MDC IDC MSMT BATTERY REMAINING LONGEVITY: 126 mo
MDC IDC MSMT LEADCHNL RA IMPEDANCE VALUE: 392 Ohm
MDC IDC MSMT LEADCHNL RV IMPEDANCE VALUE: 373 Ohm
MDC IDC SESS DTM: 20160120175200
MDC IDC SET LEADCHNL RV PACING PULSEWIDTH: 0.4 ms
MDC IDC SET ZONE DETECTION INTERVAL: 375 ms

## 2014-10-11 NOTE — Progress Notes (Signed)
Remote pacemaker transmission.   

## 2014-10-11 NOTE — Telephone Encounter (Signed)
Spoke with pt and reminded pt of remote transmission that is due today. Pt verbalized understanding.   

## 2014-10-17 ENCOUNTER — Encounter: Payer: Self-pay | Admitting: Cardiology

## 2014-10-25 ENCOUNTER — Encounter: Payer: Self-pay | Admitting: Internal Medicine

## 2014-11-01 ENCOUNTER — Ambulatory Visit (INDEPENDENT_AMBULATORY_CARE_PROVIDER_SITE_OTHER): Payer: Medicare Other | Admitting: *Deleted

## 2014-11-01 DIAGNOSIS — I482 Chronic atrial fibrillation, unspecified: Secondary | ICD-10-CM

## 2014-11-01 DIAGNOSIS — I4891 Unspecified atrial fibrillation: Secondary | ICD-10-CM | POA: Diagnosis not present

## 2014-11-01 DIAGNOSIS — Z5181 Encounter for therapeutic drug level monitoring: Secondary | ICD-10-CM | POA: Diagnosis not present

## 2014-11-01 DIAGNOSIS — Z7901 Long term (current) use of anticoagulants: Secondary | ICD-10-CM

## 2014-11-01 LAB — POCT INR: INR: 2.2

## 2014-11-27 ENCOUNTER — Other Ambulatory Visit: Payer: Self-pay | Admitting: Interventional Cardiology

## 2014-12-13 ENCOUNTER — Ambulatory Visit (INDEPENDENT_AMBULATORY_CARE_PROVIDER_SITE_OTHER): Payer: Medicare Other | Admitting: *Deleted

## 2014-12-13 DIAGNOSIS — I482 Chronic atrial fibrillation, unspecified: Secondary | ICD-10-CM

## 2014-12-13 DIAGNOSIS — Z5181 Encounter for therapeutic drug level monitoring: Secondary | ICD-10-CM

## 2014-12-13 DIAGNOSIS — Z7901 Long term (current) use of anticoagulants: Secondary | ICD-10-CM | POA: Diagnosis not present

## 2014-12-13 DIAGNOSIS — I4891 Unspecified atrial fibrillation: Secondary | ICD-10-CM

## 2014-12-13 LAB — POCT INR: INR: 2.5

## 2015-01-10 ENCOUNTER — Ambulatory Visit (INDEPENDENT_AMBULATORY_CARE_PROVIDER_SITE_OTHER): Payer: Medicare Other | Admitting: *Deleted

## 2015-01-10 DIAGNOSIS — I495 Sick sinus syndrome: Secondary | ICD-10-CM

## 2015-01-10 NOTE — Progress Notes (Signed)
Remote pacemaker transmission.   

## 2015-01-11 DIAGNOSIS — Z961 Presence of intraocular lens: Secondary | ICD-10-CM | POA: Diagnosis not present

## 2015-01-11 DIAGNOSIS — H04123 Dry eye syndrome of bilateral lacrimal glands: Secondary | ICD-10-CM | POA: Diagnosis not present

## 2015-01-15 DIAGNOSIS — E0590 Thyrotoxicosis, unspecified without thyrotoxic crisis or storm: Secondary | ICD-10-CM | POA: Diagnosis not present

## 2015-01-24 ENCOUNTER — Ambulatory Visit (INDEPENDENT_AMBULATORY_CARE_PROVIDER_SITE_OTHER): Payer: Medicare Other | Admitting: *Deleted

## 2015-01-24 DIAGNOSIS — Z5181 Encounter for therapeutic drug level monitoring: Secondary | ICD-10-CM | POA: Diagnosis not present

## 2015-01-24 DIAGNOSIS — I482 Chronic atrial fibrillation, unspecified: Secondary | ICD-10-CM

## 2015-01-24 DIAGNOSIS — I4891 Unspecified atrial fibrillation: Secondary | ICD-10-CM | POA: Diagnosis not present

## 2015-01-24 DIAGNOSIS — Z7901 Long term (current) use of anticoagulants: Secondary | ICD-10-CM | POA: Diagnosis not present

## 2015-01-24 LAB — POCT INR: INR: 3

## 2015-01-26 LAB — CUP PACEART REMOTE DEVICE CHECK
Brady Statistic RA Percent Paced: 0 %
Date Time Interrogation Session: 20160420044100
Lead Channel Impedance Value: 381 Ohm
Lead Channel Setting Pacing Amplitude: 2.4 V
Lead Channel Setting Sensing Sensitivity: 2.5 mV
MDC IDC MSMT BATTERY REMAINING LONGEVITY: 120 mo
MDC IDC MSMT BATTERY REMAINING PERCENTAGE: 100 %
MDC IDC MSMT LEADCHNL RV IMPEDANCE VALUE: 367 Ohm
MDC IDC SET LEADCHNL RV PACING PULSEWIDTH: 0.4 ms
MDC IDC STAT BRADY RV PERCENT PACED: 98 %
Pulse Gen Serial Number: 111635
Zone Setting Detection Interval: 375 ms

## 2015-02-08 ENCOUNTER — Encounter: Payer: Self-pay | Admitting: Interventional Cardiology

## 2015-02-08 ENCOUNTER — Ambulatory Visit (INDEPENDENT_AMBULATORY_CARE_PROVIDER_SITE_OTHER): Payer: Medicare Other | Admitting: Interventional Cardiology

## 2015-02-08 VITALS — BP 115/62 | HR 60 | Ht 66.0 in | Wt 123.8 lb

## 2015-02-08 DIAGNOSIS — R0989 Other specified symptoms and signs involving the circulatory and respiratory systems: Secondary | ICD-10-CM

## 2015-02-08 DIAGNOSIS — Z7901 Long term (current) use of anticoagulants: Secondary | ICD-10-CM | POA: Diagnosis not present

## 2015-02-08 DIAGNOSIS — I059 Rheumatic mitral valve disease, unspecified: Secondary | ICD-10-CM | POA: Diagnosis not present

## 2015-02-08 DIAGNOSIS — I4891 Unspecified atrial fibrillation: Secondary | ICD-10-CM | POA: Diagnosis not present

## 2015-02-08 DIAGNOSIS — I10 Essential (primary) hypertension: Secondary | ICD-10-CM

## 2015-02-08 MED ORDER — FUROSEMIDE 20 MG PO TABS: 20.0000 mg | ORAL_TABLET | ORAL | Status: DC | PRN

## 2015-02-08 NOTE — Patient Instructions (Signed)
**Note De-Identified  Obfuscation** Medication Instructions:  Start taking Lasix 20 mg as needed for edema  Labwork: None  Testing/Procedures: Your physician has requested that you have a carotid duplex. This test is an ultrasound of the carotid arteries in your neck. It looks at blood flow through these arteries that supply the brain with blood. Allow one hour for this exam. There are no restrictions or special instructions.   Follow-Up: Your physician wants you to follow-up in: 1 year. You will receive a reminder letter in the mail two months in advance. If you don't receive a letter, please call our office to schedule the follow-up appointment.

## 2015-02-08 NOTE — Progress Notes (Signed)
Patient ID: Eddie Park, male   DOB: July 12, 1927, 81 y.o.   MRN: [id number]     Cardiology Office Note   Date:  02/08/2015   ID:  Eddie Park, DOB Feb 13, 1927, MRN [id number]  PCP:  Gerrit Heck, MD    No chief complaint on file.  F/u MR  Wt Readings from Last 3 Encounters:  02/08/15 123 lb 12.8 oz (56.155 kg)  07/10/14 117 lb 6.4 oz (53.252 kg)  01/25/14 117 lb 3.2 oz (53.162 kg)       History of Present Illness: Eddie Park is a 81 y.o. male  with AFib, mitral regurgitation, cardiomyopathy, and pacer dependent. He is now in AFib 100% of the time by pacer check. EF 45% in 2011. Atrial Fibrillation F/U:  c/o Dizziness while getting up from sitting position.  Denies : Chest pain.  Orthopnea.  Palpitations.  Shortness of breath.  Syncope.  some walking, daily. No bleeding problems.  Mild ankle edema and DOE when walking fast.  No orthopnea.  Still walks.   Reports that his right carotid gives a large pulsatile mass effect in the right neck.   Past Medical History  Diagnosis Date  . Coronary artery disease     s/p MI 1995  . Prostate cancer 2002  . Persistent atrial fibrillation   . Tachycardia-bradycardia syndrome     with syncope, s/p PPM by Dr Leonia Reeves  . Hypertension   . Seizure disorder   . Anxiety   . Mitral valve regurgitation     Past Surgical History  Procedure Laterality Date  . Prostate surgery    . Pacemaker insertion  04/29/06    Generator change (BSc) by Dr Rayann Heman 06/21/12  . Artificial urinary sphincter    . Permanent pacemaker generator change N/A 06/08/2012    Procedure: PERMANENT PACEMAKER GENERATOR CHANGE;  Surgeon: Thompson Grayer, MD;  Location: [facility] CATH LAB;  Service: Cardiovascular;  Laterality: N/A;     Current Outpatient Prescriptions  Medication Sig Dispense Refill  . cholecalciferol (VITAMIN D) 1000 UNITS tablet Take 2,000 Units by mouth daily.    . fish oil-omega-3 fatty acids 1000 MG capsule Take 2 g by mouth  daily.    [name] lisinopril (PRINIVIL,ZESTRIL) 20 MG tablet Take 10 mg by mouth daily. Patient taking one half tablet 10 mgs daily    . metoprolol succinate (TOPROL-XL) 50 MG 24 hr tablet TAKE 1/2 TABLET BY MOUTH ONCE DAILY 45 tablet 2  . Multiple Vitamin (MULTIVITAMIN WITH MINERALS) TABS Take 1 tablet by mouth daily.    [name] PHENobarbital (LUMINAL) 97.2 MG tablet Take 97.2 mg by mouth at bedtime.    . sertraline (ZOLOFT) 100 MG tablet Take 100 mg by mouth daily.    . vitamin B-12 (CYANOCOBALAMIN) 1000 MCG tablet Take 1,000 mcg by mouth daily.    [name] warfarin (COUMADIN) 5 MG tablet Take as directed by Coumadin clinic 180 tablet 1   No current facility-administered medications for this visit.    Allergies:   Review of patient's allergies indicates no known allergies.    Social History:  The patient  reports that he has never smoked. He does not have any smokeless tobacco history on file. He reports that he does not drink alcohol or use illicit drugs.   Family History:  The patient's *family history includes Colon polyps in his brother.    ROS:  Please see the history of present illness.   Otherwise, review of systems are positive for leg edema, DOE with  fast walking.   All other systems are reviewed and negative.    PHYSICAL EXAM: VS:  BP 115/62 mmHg  Pulse 60  Ht 5\' 6"  (1.676 m)  Wt 123 lb 12.8 oz (56.155 kg)  BMI 19.99 kg/m2 , BMI Body mass index is 19.99 kg/(m^2). GEN: Well nourished, well developed, in no acute distress HEENT: normal Neck: no JVD, right  Bruit , soft, right carotid is large and very pulsatile, much greater than the left Cardiac: RRR; no murmurs, rubs, or gallops,no edema  Respiratory:  clear to auscultation bilaterally, normal work of breathing GI: soft, nontender, nondistended, + BS MS: no deformity or atrophy Skin: warm and dry, no rash Neuro:  Strength and sensation are intact Psych: euthymic mood, full affect   EKG:   The ekg ordered today demonstrates paced  rhythm   Recent Labs: No results found for requested labs within last 365 days.   Lipid Panel    Component Value Date/Time   CHOL  09/26/2009 2344    142        ATP III CLASSIFICATION:  <200     mg/dL   Desirable  200-239  mg/dL   Borderline High  >=240    mg/dL   High          TRIG 65 09/26/2009 2344   HDL 42 09/26/2009 2344   CHOLHDL 3.4 09/26/2009 2344   VLDL 13 09/26/2009 2344   LDLCALC  09/26/2009 2344    87        Total Cholesterol/HDL:CHD Risk Coronary Heart Disease Risk Table                     Men   Women  1/2 Average Risk   3.4   3.3  Average Risk       5.0   4.4  2 X Average Risk   9.6   7.1  3 X Average Risk  23.4   11.0        Use the calculated Patient Ratio above and the CHD Risk Table to determine the patient's CHD Risk.        ATP III CLASSIFICATION (LDL):  <100     mg/dL   Optimal  100-129  mg/dL   Near or Above                    Optimal  130-159  mg/dL   Borderline  160-189  mg/dL   High  >190     mg/dL   Very High     Other studies Reviewed: Additional studies/ records that were reviewed today with results demonstrating: valvular dysfunction by 2011 echo as noted below.   ASSESSMENT AND PLAN:  Coronary atherosclerosis of unspecified type of vessel, native or graft  Notes: No angina. MI in 1995.  2. Mitral Regurgitation   severe MR/ severe AI. Medical therapy only. Discussed at length with the patient and wife. He would not be a surgical candidate. Would not pursue any further w/u    Notes: moderate to severe in the past with mildly decreased LV function. He will let us know if he has any sx of fluid overload. No orthopnea at this time.  Only uses 1 pillow. Mild ankle edema but that may be from venous insufficiency. 3. Atrial fibrillation  Notes: Permanent AFib. Paced much of the time. Coumadin for stroke prevention. No bleeding issues. Not feeling palpitations.  4. Wife states that his activity level is stable. No obvious  SHOB with regular activity.  HE can get a little SHOB when he walks fast..        Carotid bruit: right. Check Doppler.   Current medicines are reviewed at length with the patient today.  The patient concerns regarding his medicines were addressed.  The following changes have been made:  No change  Labs/ tests ordered today include: Carotid Doppler No orders of the defined types were placed in this encounter.    Recommend 150 minutes/week of aerobic exercise Low fat, low carb, high fiber diet recommended  Disposition:   FU in 1 year   Teresita Madura., MD  02/08/2015 2:17 PM    [facility] [place], [place], [place]  [address] Phone: [phone]; Fax: [phone]

## 2015-02-12 DIAGNOSIS — I4891 Unspecified atrial fibrillation: Secondary | ICD-10-CM | POA: Diagnosis not present

## 2015-02-12 DIAGNOSIS — Z79899 Other long term (current) drug therapy: Secondary | ICD-10-CM | POA: Diagnosis not present

## 2015-02-12 DIAGNOSIS — I2510 Atherosclerotic heart disease of native coronary artery without angina pectoris: Secondary | ICD-10-CM | POA: Diagnosis not present

## 2015-02-12 DIAGNOSIS — Z950 Presence of cardiac pacemaker: Secondary | ICD-10-CM | POA: Diagnosis not present

## 2015-02-12 DIAGNOSIS — Z8659 Personal history of other mental and behavioral disorders: Secondary | ICD-10-CM | POA: Diagnosis not present

## 2015-02-12 DIAGNOSIS — E780 Pure hypercholesterolemia: Secondary | ICD-10-CM | POA: Diagnosis not present

## 2015-02-12 DIAGNOSIS — C61 Malignant neoplasm of prostate: Secondary | ICD-10-CM | POA: Diagnosis not present

## 2015-02-12 DIAGNOSIS — F329 Major depressive disorder, single episode, unspecified: Secondary | ICD-10-CM | POA: Diagnosis not present

## 2015-02-12 DIAGNOSIS — N183 Chronic kidney disease, stage 3 (moderate): Secondary | ICD-10-CM | POA: Diagnosis not present

## 2015-02-12 DIAGNOSIS — I351 Nonrheumatic aortic (valve) insufficiency: Secondary | ICD-10-CM | POA: Diagnosis not present

## 2015-02-12 DIAGNOSIS — I10 Essential (primary) hypertension: Secondary | ICD-10-CM | POA: Diagnosis not present

## 2015-02-12 DIAGNOSIS — G40909 Epilepsy, unspecified, not intractable, without status epilepticus: Secondary | ICD-10-CM | POA: Diagnosis not present

## 2015-02-14 ENCOUNTER — Encounter: Payer: Self-pay | Admitting: Cardiology

## 2015-02-21 ENCOUNTER — Encounter: Payer: Self-pay | Admitting: Internal Medicine

## 2015-02-28 ENCOUNTER — Other Ambulatory Visit: Payer: Self-pay | Admitting: Interventional Cardiology

## 2015-03-07 ENCOUNTER — Ambulatory Visit (HOSPITAL_COMMUNITY): Payer: Medicare Other | Attending: Cardiology

## 2015-03-07 ENCOUNTER — Ambulatory Visit (INDEPENDENT_AMBULATORY_CARE_PROVIDER_SITE_OTHER): Payer: Medicare Other | Admitting: *Deleted

## 2015-03-07 DIAGNOSIS — I6523 Occlusion and stenosis of bilateral carotid arteries: Secondary | ICD-10-CM | POA: Insufficient documentation

## 2015-03-07 DIAGNOSIS — R0989 Other specified symptoms and signs involving the circulatory and respiratory systems: Secondary | ICD-10-CM

## 2015-03-07 DIAGNOSIS — I4891 Unspecified atrial fibrillation: Secondary | ICD-10-CM | POA: Diagnosis not present

## 2015-03-07 DIAGNOSIS — Z5181 Encounter for therapeutic drug level monitoring: Secondary | ICD-10-CM

## 2015-03-07 DIAGNOSIS — Z7901 Long term (current) use of anticoagulants: Secondary | ICD-10-CM

## 2015-03-07 LAB — POCT INR: INR: 2.4

## 2015-04-16 ENCOUNTER — Encounter: Payer: Self-pay | Admitting: Internal Medicine

## 2015-04-16 ENCOUNTER — Ambulatory Visit (INDEPENDENT_AMBULATORY_CARE_PROVIDER_SITE_OTHER): Payer: Medicare Other | Admitting: *Deleted

## 2015-04-16 DIAGNOSIS — I495 Sick sinus syndrome: Secondary | ICD-10-CM | POA: Diagnosis not present

## 2015-04-16 DIAGNOSIS — Z950 Presence of cardiac pacemaker: Secondary | ICD-10-CM

## 2015-04-18 ENCOUNTER — Ambulatory Visit (INDEPENDENT_AMBULATORY_CARE_PROVIDER_SITE_OTHER): Payer: Medicare Other | Admitting: Pharmacist

## 2015-04-18 DIAGNOSIS — I4891 Unspecified atrial fibrillation: Secondary | ICD-10-CM | POA: Diagnosis not present

## 2015-04-18 DIAGNOSIS — Z5181 Encounter for therapeutic drug level monitoring: Secondary | ICD-10-CM

## 2015-04-18 DIAGNOSIS — Z7901 Long term (current) use of anticoagulants: Secondary | ICD-10-CM

## 2015-04-18 LAB — POCT INR: INR: 2.6

## 2015-04-24 LAB — CUP PACEART REMOTE DEVICE CHECK
Battery Remaining Percentage: 100 %
Brady Statistic RA Percent Paced: 0 %
Brady Statistic RV Percent Paced: 98 %
Lead Channel Setting Pacing Pulse Width: 0.4 ms
Lead Channel Setting Sensing Sensitivity: 2.5 mV
MDC IDC MSMT BATTERY REMAINING LONGEVITY: 120 mo
MDC IDC MSMT LEADCHNL RA IMPEDANCE VALUE: 358 Ohm
MDC IDC MSMT LEADCHNL RV IMPEDANCE VALUE: 350 Ohm
MDC IDC SESS DTM: 20160725044100
MDC IDC SET LEADCHNL RV PACING AMPLITUDE: 2.4 V
MDC IDC SET ZONE DETECTION INTERVAL: 375 ms
Pulse Gen Serial Number: 111635

## 2015-04-24 NOTE — Progress Notes (Signed)
Pacemaker remote check. Device function reviewed. Impedance, sensing, auto capture thresholds consistent with previous measurements. Histograms appropriate for patient and level of activity. All other diagnostic data reviewed and is appropriate and stable for patient. Real time/magnet EGM shows appropriate sensing and capture. 4 NSVT episodes - stable for patient. Estimated longevity 10 years. F/u JA 06/2015 as scheduled.

## 2015-05-03 DIAGNOSIS — C44311 Basal cell carcinoma of skin of nose: Secondary | ICD-10-CM | POA: Diagnosis not present

## 2015-05-03 DIAGNOSIS — T148 Other injury of unspecified body region: Secondary | ICD-10-CM | POA: Diagnosis not present

## 2015-05-03 DIAGNOSIS — D485 Neoplasm of uncertain behavior of skin: Secondary | ICD-10-CM | POA: Diagnosis not present

## 2015-05-03 DIAGNOSIS — L219 Seborrheic dermatitis, unspecified: Secondary | ICD-10-CM | POA: Diagnosis not present

## 2015-05-03 DIAGNOSIS — L570 Actinic keratosis: Secondary | ICD-10-CM | POA: Diagnosis not present

## 2015-05-03 DIAGNOSIS — L821 Other seborrheic keratosis: Secondary | ICD-10-CM | POA: Diagnosis not present

## 2015-05-03 DIAGNOSIS — Z85828 Personal history of other malignant neoplasm of skin: Secondary | ICD-10-CM | POA: Diagnosis not present

## 2015-05-03 DIAGNOSIS — D225 Melanocytic nevi of trunk: Secondary | ICD-10-CM | POA: Diagnosis not present

## 2015-05-07 ENCOUNTER — Encounter: Payer: Self-pay | Admitting: Cardiology

## 2015-05-30 ENCOUNTER — Ambulatory Visit (INDEPENDENT_AMBULATORY_CARE_PROVIDER_SITE_OTHER): Payer: Medicare Other | Admitting: *Deleted

## 2015-05-30 DIAGNOSIS — Z5181 Encounter for therapeutic drug level monitoring: Secondary | ICD-10-CM | POA: Diagnosis not present

## 2015-05-30 DIAGNOSIS — I4891 Unspecified atrial fibrillation: Secondary | ICD-10-CM

## 2015-05-30 DIAGNOSIS — Z7901 Long term (current) use of anticoagulants: Secondary | ICD-10-CM

## 2015-05-30 LAB — POCT INR: INR: 2

## 2015-06-12 DIAGNOSIS — C44311 Basal cell carcinoma of skin of nose: Secondary | ICD-10-CM | POA: Diagnosis not present

## 2015-07-02 DIAGNOSIS — Z23 Encounter for immunization: Secondary | ICD-10-CM | POA: Diagnosis not present

## 2015-07-09 ENCOUNTER — Encounter: Payer: Self-pay | Admitting: Internal Medicine

## 2015-07-09 ENCOUNTER — Ambulatory Visit (INDEPENDENT_AMBULATORY_CARE_PROVIDER_SITE_OTHER): Payer: Medicare Other | Admitting: Internal Medicine

## 2015-07-09 ENCOUNTER — Ambulatory Visit (INDEPENDENT_AMBULATORY_CARE_PROVIDER_SITE_OTHER): Payer: Medicare Other | Admitting: *Deleted

## 2015-07-09 VITALS — BP 120/66 | HR 65 | Ht 66.0 in | Wt 128.8 lb

## 2015-07-09 DIAGNOSIS — Z5181 Encounter for therapeutic drug level monitoring: Secondary | ICD-10-CM

## 2015-07-09 DIAGNOSIS — Z950 Presence of cardiac pacemaker: Secondary | ICD-10-CM

## 2015-07-09 DIAGNOSIS — I4891 Unspecified atrial fibrillation: Secondary | ICD-10-CM

## 2015-07-09 DIAGNOSIS — I495 Sick sinus syndrome: Secondary | ICD-10-CM | POA: Diagnosis not present

## 2015-07-09 DIAGNOSIS — I482 Chronic atrial fibrillation, unspecified: Secondary | ICD-10-CM

## 2015-07-09 DIAGNOSIS — Z7901 Long term (current) use of anticoagulants: Secondary | ICD-10-CM

## 2015-07-09 LAB — CUP PACEART INCLINIC DEVICE CHECK
Date Time Interrogation Session: 20161017040000
Implantable Lead Implant Date: 20130917
Implantable Lead Location: 753860
Implantable Lead Model: 4456
Implantable Lead Serial Number: 448722
Implantable Lead Serial Number: 484382
Lead Channel Impedance Value: 363 Ohm
Lead Channel Pacing Threshold Amplitude: 0.8 V
Lead Channel Pacing Threshold Pulse Width: 0.4 ms
Lead Channel Setting Pacing Pulse Width: 0.4 ms
Lead Channel Setting Sensing Sensitivity: 2.5 mV
MDC IDC LEAD IMPLANT DT: 20130917
MDC IDC LEAD LOCATION: 753859
MDC IDC MSMT LEADCHNL RA IMPEDANCE VALUE: 389 Ohm
MDC IDC PG SERIAL: 111635
MDC IDC SET LEADCHNL RV PACING AMPLITUDE: 2.4 V
MDC IDC SET ZONE DETECTION INTERVAL: 375 ms
MDC IDC SET ZONE VENDOR TYPE: 771137
MDC IDC STAT BRADY RV PERCENT PACED: 98 %

## 2015-07-09 LAB — POCT INR: INR: 2.2

## 2015-07-09 NOTE — Progress Notes (Signed)
Primary Cardiologist:  Dr Beatrice Lecher is a 81 y.o. male who presents today for routine electrophysiology followup.  Since his last visit, the patient reports doing reasonably well.  + mild edema.  Today, he denies symptoms of palpitations, chest pain, shortness of breath,  dizziness, presyncope, or syncope.  The patient is otherwise without complaint today.   Past Medical History  Diagnosis Date  . Coronary artery disease     s/p MI 1995  . Prostate cancer ([place]) 2002  . Persistent atrial fibrillation ([place])   . Tachycardia-bradycardia syndrome ([place])     with syncope, s/p PPM by Dr Leonia Reeves  . Hypertension   . Seizure disorder ([place])   . Anxiety   . Mitral valve regurgitation    Past Surgical History  Procedure Laterality Date  . Prostate surgery    . Pacemaker insertion  04/29/06    Generator change (BSc) by Dr Rayann Heman 06/21/12  . Artificial urinary sphincter    . Permanent pacemaker generator change N/A 06/08/2012    Procedure: PERMANENT PACEMAKER GENERATOR CHANGE;  Surgeon: Thompson Grayer, MD;  Location: [facility] CATH LAB;  Service: Cardiovascular;  Laterality: N/A;    Current Outpatient Prescriptions  Medication Sig Dispense Refill  . cholecalciferol (VITAMIN D) 1000 UNITS tablet Take 2,000 Units by mouth daily.    . fish oil-omega-3 fatty acids 1000 MG capsule Take 2 g by mouth daily.    . furosemide (LASIX) 20 MG tablet Take 20 mg by mouth daily as needed for edema.    [name] lisinopril (PRINIVIL,ZESTRIL) 20 MG tablet Take 10 mg by mouth daily.     . metoprolol succinate (TOPROL-XL) 50 MG 24 hr tablet TAKE 1/2 TABLET BY MOUTH ONCE DAILY 45 tablet 2  . Multiple Vitamin (MULTIVITAMIN WITH MINERALS) TABS Take 1 tablet by mouth daily.    [name] PHENobarbital (LUMINAL) 97.2 MG tablet Take 97.2 mg by mouth at bedtime.    . sertraline (ZOLOFT) 100 MG tablet Take 100 mg by mouth daily.    . vitamin B-12 (CYANOCOBALAMIN) 1000 MCG tablet Take 1,000 mcg by mouth daily.    [name] warfarin (COUMADIN) 5  MG tablet TAKE AS DIRECTED BY COUMADIN CLINIC 150 tablet 1   No current facility-administered medications for this visit.    Physical Exam: Filed Vitals:   07/09/15 1127  BP: 120/66  Pulse: 65  Height: 5\' 6"  (1.676 m)  Weight: 128 lb 12.8 oz (58.423 kg)    GEN- The patient is well appearing, alert and oriented x 3 today.   Head- normocephalic, atraumatic Eyes-  Sclera clear, conjunctiva pink Ears- hearing intact Oropharynx- clear Lungs- Clear to ausculation bilaterally, normal work of breathing Chest- pacemaker pocket is well healed Heart- irregular rate and rhythm, 2/6 SEM LSB GI- soft, NT, ND, + BS Extremities- no clubbing, cyanosis, +1 edema  Pacemaker interrogation- reviewed in detail today,  See PACEART report  Assessment and Plan:  1. Bradycardia/ tachycardia Normal pacemaker function See [facility] Art report  2. Permanent afib Continue coumadin long term  3. Mild edema Will take lasix 20mg  daily x 3 days then prn 2 gram sodium restriction  [place] every 3 months Return to see EP NP in 1 year Follow-up with Dr Irish Lack as scheduled.  Thompson Grayer MD, [facility] 07/09/2015 11:44 AM

## 2015-07-09 NOTE — Patient Instructions (Addendum)
Medication Instructions:  Take you Furosemide daily for the next 3 days   Labwork: None ordered   Testing/Procedures: None ordered   Follow-Up: Your physician wants you to follow-up in: 12 months with Chanetta Marshall, NP You will receive a reminder letter in the mail two months in advance. If you don't receive a letter, please call our office to schedule the follow-up appointment.   Remote monitoring is used to monitor your Pacemaker  from home. This monitoring reduces the number of office visits required to check your device to one time per year. It allows [place] to keep an eye on the functioning of your device to ensure it is working properly. You are scheduled for a device check from home on 10/08/15. You may send your transmission at any time that day. If you have a wireless device, the transmission will be sent automatically. After your physician reviews your transmission, you will receive a postcard with your next transmission date.    Any Other Special Instructions Will Be Listed Below (If Applicable).  Low-Sodium Eating Plan Sodium raises blood pressure and causes water to be held in the body. Getting less sodium from food will help lower your blood pressure, reduce any swelling, and protect your heart, liver, and kidneys. We get sodium by adding salt (sodium chloride) to food. Most of our sodium comes from canned, boxed, and frozen foods. Restaurant foods, fast foods, and pizza are also very high in sodium. Even if you take medicine to lower your blood pressure or to reduce fluid in your body, getting less sodium from your food is important. WHAT IS MY PLAN? Most people should limit their sodium intake to 2,300 mg a day. Your health care provider recommends that you limit your sodium intake to 2 grams a day.  WHAT DO I NEED TO KNOW ABOUT THIS EATING PLAN? For the low-sodium eating plan, you will follow these general guidelines:  Choose foods with a % Daily Value for sodium of less than  5% (as listed on the food label).   Use salt-free seasonings or herbs instead of table salt or sea salt.   Check with your health care provider or pharmacist before using salt substitutes.   Eat fresh foods.  Eat more vegetables and fruits.  Limit canned vegetables. If you do use them, rinse them well to decrease the sodium.   Limit cheese to 1 oz (28 g) per day.   Eat lower-sodium products, often labeled as "lower sodium" or "no salt added."  Avoid foods that contain monosodium glutamate (MSG). MSG is sometimes added to [place] food and some canned foods.  Check food labels (Nutrition Facts labels) on foods to learn how much sodium is in one serving.  Eat more home-cooked food and less restaurant, buffet, and fast food.  When eating at a restaurant, ask that your food be prepared with less salt, or no salt if possible.  HOW DO I READ FOOD LABELS FOR SODIUM INFORMATION? The Nutrition Facts label lists the amount of sodium in one serving of the food. If you eat more than one serving, you must multiply the listed amount of sodium by the number of servings. Food labels may also identify foods as:  Sodium free--Less than 5 mg in a serving.  Very low sodium--35 mg or less in a serving.  Low sodium--140 mg or less in a serving.  Light in sodium--50% less sodium in a serving. For example, if a food that usually has 300 mg of sodium is  changed to become light in sodium, it will have 150 mg of sodium.  Reduced sodium--25% less sodium in a serving. For example, if a food that usually has 400 mg of sodium is changed to reduced sodium, it will have 300 mg of sodium. WHAT FOODS CAN I EAT? Grains Low-sodium cereals, including oats, puffed wheat and rice, and shredded wheat cereals. Low-sodium crackers. Unsalted rice and pasta. Lower-sodium bread.  Vegetables Frozen or fresh vegetables. Low-sodium or reduced-sodium canned vegetables. Low-sodium or reduced-sodium tomato sauce and  paste. Low-sodium or reduced-sodium tomato and vegetable juices.  Fruits Fresh, frozen, and canned fruit. Fruit juice.  Meat and Other Protein Products Low-sodium canned tuna and salmon. Fresh or frozen meat, poultry, seafood, and fish. Lamb. Unsalted nuts. Dried beans, peas, and lentils without added salt. Unsalted canned beans. Homemade soups without salt. Eggs.  Dairy Milk. Soy milk. Ricotta cheese. Low-sodium or reduced-sodium cheeses. Yogurt.  Condiments Fresh and dried herbs and spices. Salt-free seasonings. Onion and garlic powders. Low-sodium varieties of mustard and ketchup. Fresh or refrigerated horseradish. Lemon juice.  Fats and Oils Reduced-sodium salad dressings. Unsalted butter.  Other Unsalted popcorn and pretzels.  The items listed above may not be a complete list of recommended foods or beverages. Contact your dietitian for more options. WHAT FOODS ARE NOT RECOMMENDED? Grains Instant hot cereals. Bread stuffing, pancake, and biscuit mixes. Croutons. Seasoned rice or pasta mixes. Noodle soup cups. Boxed or frozen macaroni and cheese. Self-rising flour. Regular salted crackers. Vegetables Regular canned vegetables. Regular canned tomato sauce and paste. Regular tomato and vegetable juices. Frozen vegetables in sauces. Salted [place] fries. Olives. Angie Fava. Relishes. Sauerkraut. Salsa. Meat and Other Protein Products Salted, canned, smoked, spiced, or pickled meats, seafood, or fish. Bacon, ham, sausage, hot dogs, corned beef, chipped beef, and packaged luncheon meats. Salt pork. Jerky. Pickled herring. Anchovies, regular canned tuna, and sardines. Salted nuts. Dairy Processed cheese and cheese spreads. Cheese curds. Blue cheese and cottage cheese. Buttermilk.  Condiments Onion and garlic salt, seasoned salt, table salt, and sea salt. Canned and packaged gravies. Worcestershire sauce. Tartar sauce. Barbecue sauce. Teriyaki sauce. Soy sauce, including reduced sodium.  Steak sauce. Fish sauce. Oyster sauce. Cocktail sauce. Horseradish that you find on the shelf. Regular ketchup and mustard. Meat flavorings and tenderizers. Bouillon cubes. Hot sauce. Tabasco sauce. Marinades. Taco seasonings. Relishes. Fats and Oils Regular salad dressings. Salted butter. Margarine. Ghee. Bacon fat.  Other Potato and tortilla chips. Corn chips and puffs. Salted popcorn and pretzels. Canned or dried soups. Pizza. Frozen entrees and pot pies.  The items listed above may not be a complete list of foods and beverages to avoid. Contact your dietitian for more information.   This information is not intended to replace advice given to you by your health care provider. Make sure you discuss any questions you have with your health care provider.   Document Released: 02/28/2002 Document Revised: 09/29/2014 Document Reviewed: 07/13/2013 Elsevier Interactive Patient Education [employer].

## 2015-08-22 ENCOUNTER — Ambulatory Visit (INDEPENDENT_AMBULATORY_CARE_PROVIDER_SITE_OTHER): Payer: Medicare Other | Admitting: *Deleted

## 2015-08-22 DIAGNOSIS — I482 Chronic atrial fibrillation, unspecified: Secondary | ICD-10-CM

## 2015-08-22 DIAGNOSIS — Z7901 Long term (current) use of anticoagulants: Secondary | ICD-10-CM | POA: Diagnosis not present

## 2015-08-22 DIAGNOSIS — I4891 Unspecified atrial fibrillation: Secondary | ICD-10-CM | POA: Diagnosis not present

## 2015-08-22 DIAGNOSIS — Z5181 Encounter for therapeutic drug level monitoring: Secondary | ICD-10-CM

## 2015-08-22 LAB — POCT INR: INR: 2.4

## 2015-08-27 ENCOUNTER — Other Ambulatory Visit: Payer: Self-pay | Admitting: Interventional Cardiology

## 2015-09-07 ENCOUNTER — Other Ambulatory Visit (INDEPENDENT_AMBULATORY_CARE_PROVIDER_SITE_OTHER): Payer: Medicare Other | Admitting: *Deleted

## 2015-09-07 ENCOUNTER — Telehealth: Payer: Self-pay | Admitting: Interventional Cardiology

## 2015-09-07 DIAGNOSIS — I10 Essential (primary) hypertension: Secondary | ICD-10-CM | POA: Diagnosis not present

## 2015-09-07 DIAGNOSIS — R600 Localized edema: Secondary | ICD-10-CM | POA: Diagnosis not present

## 2015-09-07 DIAGNOSIS — M7989 Other specified soft tissue disorders: Secondary | ICD-10-CM

## 2015-09-07 LAB — BASIC METABOLIC PANEL
BUN: 26 mg/dL — ABNORMAL HIGH (ref 7–25)
CHLORIDE: 95 mmol/L — AB (ref 98–110)
CO2: 25 mmol/L (ref 20–31)
Calcium: 8.7 mg/dL (ref 8.6–10.3)
Creat: 1.19 mg/dL — ABNORMAL HIGH (ref 0.70–1.11)
Glucose, Bld: 86 mg/dL (ref 65–99)
POTASSIUM: 4.6 mmol/L (ref 3.5–5.3)
SODIUM: 129 mmol/L — AB (ref 135–146)

## 2015-09-07 NOTE — Telephone Encounter (Signed)
New message    Wife calling    Pt c/o swelling: STAT is pt has developed SOB within 24 hours  1. How long have you been experiencing swelling? A couple of week    2. Where is the swelling located? Bot legs  3.  Are you currently taking a "fluid pill"? Yes 20 mg   4.  Are you currently SOB?  No   5.  Have you traveled recently? No

## 2015-09-07 NOTE — Telephone Encounter (Signed)
Pt's wife calling (have DPR) to speak with her.  She states he has had some swelling in his legs from ankle to knees last week.  Wt gain was about 2 lbs in a couple of days so she gave him Lasix 20 mg on Thurs, Fri and Sat.  Swelling did decrease some.  No SOB. Wt today was 126 lbs which is still up from his average of 122 lbs. She started him back on Lasix yesterday and today. Review of his chart does not show that any lab done since 2013 and his BUN and Creat were elevated.  Spoke w/Brittany Simmons,PA/flex who does suggest that he come in for lab today.  Will get a BMET and BNP to evaulate his kidney function.  May be able to increase dose of lasix if labs normal.  Advised wife and she will bring him in for the labs today.

## 2015-09-08 LAB — BRAIN NATRIURETIC PEPTIDE: Brain Natriuretic Peptide: 2098.3 pg/mL — ABNORMAL HIGH (ref 0.0–100.0)

## 2015-09-10 ENCOUNTER — Telehealth: Payer: Self-pay | Admitting: Interventional Cardiology

## 2015-09-10 NOTE — Telephone Encounter (Signed)
New message ° ° ° ° °Calling for lab results °

## 2015-09-10 NOTE — Telephone Encounter (Signed)
DISCUSSED WITH  KATY THOMPSON PA  LABS  AND KATY ALSO  SPOKE  WITH  PT'S WIFE .   PT'S WIFE INSTRUCTED  TO  INCREASE  LASIX  TO  40 MG  FOR  3  DAYS  THEN RECHECK  BMET ON FRI .VERBALIZED UNDERSTANDING  .Adonis Housekeeper

## 2015-09-14 ENCOUNTER — Other Ambulatory Visit (INDEPENDENT_AMBULATORY_CARE_PROVIDER_SITE_OTHER): Payer: Medicare Other | Admitting: *Deleted

## 2015-09-14 ENCOUNTER — Other Ambulatory Visit: Payer: Self-pay | Admitting: *Deleted

## 2015-09-14 DIAGNOSIS — I4891 Unspecified atrial fibrillation: Secondary | ICD-10-CM | POA: Diagnosis not present

## 2015-09-14 LAB — BASIC METABOLIC PANEL
BUN: 29 mg/dL — ABNORMAL HIGH (ref 7–25)
CALCIUM: 9 mg/dL (ref 8.6–10.3)
CO2: 25 mmol/L (ref 20–31)
Chloride: 91 mmol/L — ABNORMAL LOW (ref 98–110)
Creat: 1.37 mg/dL — ABNORMAL HIGH (ref 0.70–1.11)
Glucose, Bld: 107 mg/dL — ABNORMAL HIGH (ref 65–99)
Potassium: 4.2 mmol/L (ref 3.5–5.3)
SODIUM: 125 mmol/L — AB (ref 135–146)

## 2015-09-14 NOTE — Addendum Note (Signed)
Addended by: Eulis Foster on: 09/14/2015 08:39 AM   Modules accepted: Orders

## 2015-09-18 ENCOUNTER — Telehealth: Payer: Self-pay | Admitting: Interventional Cardiology

## 2015-09-18 DIAGNOSIS — I10 Essential (primary) hypertension: Secondary | ICD-10-CM

## 2015-09-18 DIAGNOSIS — I482 Chronic atrial fibrillation, unspecified: Secondary | ICD-10-CM

## 2015-09-18 NOTE — Telephone Encounter (Signed)
I called and spoke with the patient's wife. The patient has been having lower extremity swelling over the last few weeks. His baseline weight is around 122-123 lbs. 08/20/15 - weight was 121.6 lbs. The patient had some lower extremity swelling that started and on 12/6- weight was 129 lbs. He took lasix 20 mg daily x 3 days, it sounds as though this was repeated around 12/16. His weight went down to around 126 lbs with the 20 mg dose of lasix. He went back up to 129 lbs 09/10/15 and lasix was increased to 40 mg daily x 3 days (tues/ wed/ thurs) last week. BMET on 12/16 showed BUN/ creat- 26/ 1.19 and 12/23- 29/1.37. The patient did have increased urine output on the 40 mg dose of lasix and his abdominal swelling is resolved. His weight is back up to 129 lbs today and he is still having swelling in her lower extremities. He is not SOB.  I advised the patient's wife I would review with the PA in the office today and call her back.  She is agreeable. Will also forward to Dr. Irish Lack to review- last echo was 2014- EF normal, but this showed severe mitral regurg/ moderate-severe aortic valve regurg.

## 2015-09-18 NOTE — Telephone Encounter (Signed)
New Message  Pt c/o swelling: STAT is pt has developed SOB within 24 hours  1. How long have you been experiencing swelling? For about 4 weeks   2. Where is the swelling located? It started in the legs and abdomine. Mostly legs  3.  Are you currently taking a "fluid pill"? Not taking it as of now.   4.  Are you currently SOB? No   5.  Have you traveled recently?No   Comments: Have stayed on lasix for the month until he taken off. But now the patient is back to his old weight.

## 2015-09-18 NOTE — Telephone Encounter (Signed)
Reviewed symptoms with Dr. Curt Bears (DOD).  Ok to give lasix 40 mg x one dose with further recommendations from Dr. Irish Lack. I have spoken with the patient's wife and made her aware the patient may take lasix 40 mg x one dose- this afternoon is fine, if he can go ahead and take it now, otherwise, just wait until tomorrow morning. We will call her back with further recommendations from Dr. Irish Lack once received. The patient's wife is agreeable.

## 2015-09-19 ENCOUNTER — Telehealth: Payer: Self-pay | Admitting: *Deleted

## 2015-09-19 NOTE — Telephone Encounter (Signed)
Called to advise pt of his lab results, per [name], wife, she called in yesterday, 09/18/15, with some weight gain and she was told at that time the results.  She was advised to just follow the recommendations given by Nira Conn, RN and Dr. Irish Lack, per [facility] note, she will call pt's wife back with further recommendations from Dr. Irish Lack once she receives them.  Ms. Lanni verbalized understanding.

## 2015-09-19 NOTE — Telephone Encounter (Signed)
-----   Message from Eileen Stanford, PA-C sent at 09/17/2015  3:15 PM EST ----- Creat a little elevated from increased diuretics. Continue on regular dosing of lasix for now

## 2015-09-20 NOTE — Telephone Encounter (Signed)
Patient's wife calling to see what Dr. Irish Lack advised. Informed her that he has not reviewed this yet. She reports patient gaining about 1 pd/day.  States he has gained 1 pd since taking Lasix on Tuesday. She would like to know what to do. Informed patient that I would resend this to Dr. Irish Lack and contact him to have him review this today. She thanks [place] for helping.

## 2015-09-20 NOTE — Telephone Encounter (Signed)
Follow Up   Pt called. Req a call back to discuss.

## 2015-09-20 NOTE — Telephone Encounter (Signed)
Spoke with pt's wife and informed her of new orders per Dr. Irish Lack. Advised wife to call us back on Tuesday with updated weights. Wife verbalized understanding and was in agreement with this plan.

## 2015-09-20 NOTE — Telephone Encounter (Signed)
Would have him take Lasix 40 mg daily for 2 more days and report back daily weights to the office on Tuesday.   If he gets more swelling, weigth gain of >2 lbs or worsening SHOB, he can take a Lasix 40 mg daily during this holiday weekend.

## 2015-09-25 ENCOUNTER — Telehealth: Payer: Self-pay | Admitting: Interventional Cardiology

## 2015-09-25 NOTE — Telephone Encounter (Signed)
OK to continue with Lasix 40 mg daily.  He needs a BMet some time this week.

## 2015-09-25 NOTE — Telephone Encounter (Signed)
New message     Calling to give update Wt 128lbs today.  Pt seems to be doing ok.

## 2015-09-25 NOTE — Telephone Encounter (Signed)
**Note De-Identified  Obfuscation** See phone note from 09/18/15.

## 2015-09-25 NOTE — Telephone Encounter (Signed)
**Note De-Identified  Obfuscation** The pts wife, Eddie Park, called to report the pts weights since Thursday 09/20/15 and they are as follows: Friday 12/30 Wt= 130 lbs Sat 12/31 Wt= 128 lbs Sun 09/23/15  Wt= 128 lbs This am 09/25/15  Wt= 128 lbs.  She states that the pt has no SOB, he has been wearing his compression stockings, elevating his legs and avoiding salt.  She reports that his legs, feet and ankles look better but continue to be swollen. [name] states that the pt took 40 mg of Lasix daily up until this am because of the swelling in his feet, ankles and legs up to his knees. She wants to know what amount of Lasix should the pt be on going forward. Please advise.

## 2015-09-26 ENCOUNTER — Other Ambulatory Visit: Payer: Medicare Other

## 2015-09-26 DIAGNOSIS — I10 Essential (primary) hypertension: Secondary | ICD-10-CM | POA: Diagnosis not present

## 2015-09-26 DIAGNOSIS — I482 Chronic atrial fibrillation, unspecified: Secondary | ICD-10-CM

## 2015-09-26 DIAGNOSIS — I4891 Unspecified atrial fibrillation: Secondary | ICD-10-CM | POA: Diagnosis not present

## 2015-09-26 LAB — BASIC METABOLIC PANEL
BUN: 27 mg/dL — ABNORMAL HIGH (ref 7–25)
CO2: 23 mmol/L (ref 20–31)
Calcium: 8.6 mg/dL (ref 8.6–10.3)
Chloride: 89 mmol/L — ABNORMAL LOW (ref 98–110)
Creat: 1.25 mg/dL — ABNORMAL HIGH (ref 0.70–1.11)
GLUCOSE: 77 mg/dL (ref 65–99)
POTASSIUM: 4.5 mmol/L (ref 3.5–5.3)
SODIUM: 122 mmol/L — AB (ref 135–146)

## 2015-09-26 MED ORDER — FUROSEMIDE 20 MG PO TABS: 40.0000 mg | ORAL_TABLET | Freq: Every day | ORAL | Status: DC

## 2015-09-26 NOTE — Telephone Encounter (Signed)
The pts wife, [name], is advised and she verbalized understanding. She is requesting that I send refill in to [facility] pharmacy in a 20 mg tablet with instruction to take two 20 mg tablets daily (in case dose is lowered in the future) for a 90 day supply. Also she states that the pt will come to the office today for a BMET.  RX sent to [facility] and BMET has been ordered and scheduled to be drawn today.

## 2015-09-28 ENCOUNTER — Emergency Department (HOSPITAL_COMMUNITY): Payer: Medicare Other

## 2015-09-28 ENCOUNTER — Emergency Department (HOSPITAL_COMMUNITY)
Admission: EM | Admit: 2015-09-28 | Discharge: 2015-09-28 | Disposition: A | Discharge status: Home / Self Care | Payer: Medicare Other | Attending: Emergency Medicine | Admitting: Emergency Medicine

## 2015-09-28 ENCOUNTER — Encounter (HOSPITAL_COMMUNITY): Payer: Self-pay | Admitting: Family Medicine

## 2015-09-28 ENCOUNTER — Telehealth: Payer: Self-pay | Admitting: *Deleted

## 2015-09-28 DIAGNOSIS — M25531 Pain in right wrist: Secondary | ICD-10-CM

## 2015-09-28 DIAGNOSIS — Y998 Other external cause status: Secondary | ICD-10-CM | POA: Insufficient documentation

## 2015-09-28 DIAGNOSIS — Z79899 Other long term (current) drug therapy: Secondary | ICD-10-CM | POA: Diagnosis not present

## 2015-09-28 DIAGNOSIS — Z8546 Personal history of malignant neoplasm of prostate: Secondary | ICD-10-CM | POA: Insufficient documentation

## 2015-09-28 DIAGNOSIS — G40909 Epilepsy, unspecified, not intractable, without status epilepticus: Secondary | ICD-10-CM | POA: Diagnosis not present

## 2015-09-28 DIAGNOSIS — S52611A Displaced fracture of right ulna styloid process, initial encounter for closed fracture: Secondary | ICD-10-CM | POA: Diagnosis not present

## 2015-09-28 DIAGNOSIS — W19XXXA Unspecified fall, initial encounter: Secondary | ICD-10-CM

## 2015-09-28 DIAGNOSIS — S6991XA Unspecified injury of right wrist, hand and finger(s), initial encounter: Secondary | ICD-10-CM | POA: Insufficient documentation

## 2015-09-28 DIAGNOSIS — S0990XA Unspecified injury of head, initial encounter: Secondary | ICD-10-CM | POA: Diagnosis not present

## 2015-09-28 DIAGNOSIS — S199XXA Unspecified injury of neck, initial encounter: Secondary | ICD-10-CM | POA: Insufficient documentation

## 2015-09-28 DIAGNOSIS — F319 Bipolar disorder, unspecified: Secondary | ICD-10-CM | POA: Diagnosis not present

## 2015-09-28 DIAGNOSIS — Y9389 Activity, other specified: Secondary | ICD-10-CM | POA: Diagnosis not present

## 2015-09-28 DIAGNOSIS — Y92009 Unspecified place in unspecified non-institutional (private) residence as the place of occurrence of the external cause: Secondary | ICD-10-CM | POA: Insufficient documentation

## 2015-09-28 DIAGNOSIS — S0003XA Contusion of scalp, initial encounter: Secondary | ICD-10-CM | POA: Insufficient documentation

## 2015-09-28 DIAGNOSIS — M542 Cervicalgia: Secondary | ICD-10-CM | POA: Diagnosis not present

## 2015-09-28 DIAGNOSIS — I2510 Atherosclerotic heart disease of native coronary artery without angina pectoris: Secondary | ICD-10-CM | POA: Insufficient documentation

## 2015-09-28 DIAGNOSIS — W01198A Fall on same level from slipping, tripping and stumbling with subsequent striking against other object, initial encounter: Secondary | ICD-10-CM | POA: Diagnosis not present

## 2015-09-28 DIAGNOSIS — I10 Essential (primary) hypertension: Secondary | ICD-10-CM | POA: Diagnosis not present

## 2015-09-28 DIAGNOSIS — Z950 Presence of cardiac pacemaker: Secondary | ICD-10-CM | POA: Insufficient documentation

## 2015-09-28 DIAGNOSIS — S0093XA Contusion of unspecified part of head, initial encounter: Secondary | ICD-10-CM | POA: Diagnosis not present

## 2015-09-28 MED ORDER — ACETAMINOPHEN 325 MG PO TABS: 650.0000 mg | ORAL_TABLET | Freq: Once | ORAL | Status: DC

## 2015-09-28 NOTE — Progress Notes (Signed)
Orthopedic Tech Progress Note Patient Details:  Eddie Park 03-02-1927 [id number]  Ortho Devices Type of Ortho Device: Thumb velcro splint Ortho Device/Splint Location: rue Ortho Device/Splint Interventions: Ordered, Application   Karolee Stamps 09/28/2015, 4:57 PM

## 2015-09-28 NOTE — Discharge Instructions (Signed)
Possible Scaphoid Fracture, Wrist A fracture is a break in the bone. The bone you have broken often does not show up as a fracture on x-ray until later on in the healing phase. This bone is called the scaphoid bone. With this bone, your caregiver will often cast or splint your wrist as though it is fractured, even if a fracture is not seen on the x-ray. This is often done with wrist injuries in which there is tenderness at the base of the thumb. An x-ray at 1-3 weeks after your injury may confirm this fracture. A cast or splint is used to protect and keep your injured bone in good position for healing. The cast or splint will be on generally for about 6 to 16 weeks, depending on your health, age, the fracture location and how quickly you heal. Another name for the scaphoid bone is the navicular bone. HOME CARE INSTRUCTIONS   To lessen the swelling and pain, keep the injured part elevated above your heart while sitting or lying down.  Apply ice to the injury for 15-20 minutes, 03-04 times per day while awake, for 2 days. Put the ice in a plastic bag and place a thin towel between the bag of ice and your cast.  If you have a plaster or fiberglass cast or splint:  Do not try to scratch the skin under the cast using sharp or pointed objects.  Check the skin around the cast every day. You may put lotion on any red or sore areas.  Keep your cast or splint dry and clean.  If you have a plaster splint:  Wear the splint as directed.  You may loosen the elastic bandage around the splint if your fingers become numb, tingle, or turn cold or blue.  If you have been put in a removable splint, wear and use as directed.  Do not put pressure on any part of your cast or splint; it may deform or break. Rest your cast or splint only on a pillow the first 24 hours until it is fully hardened.  Your cast or splint can be protected during bathing with a plastic bag. Do not lower the cast or splint into  water.  Only take over-the-counter or prescription medicines for pain, discomfort, or fever as directed by your caregiver.  If your caregiver has given you a follow up appointment, it is very important to keep that appointment. Not keeping the appointment could result in chronic pain and decreased function. If there is any problem keeping the appointment, you must call back to this facility for assistance. SEEK IMMEDIATE MEDICAL CARE IF:   Your cast gets damaged, wet or breaks.  You have continued severe pain or more swelling than you did before the cast or splint was put on.  Your skin or nails below the injury turn blue or gray, or feel cold or numb.  You have tingling or burning pain in your fingers or increasing pain with movement of your fingers   This information is not intended to replace advice given to you by your health care provider. Make sure you discuss any questions you have with your health care provider.   Document Released: 08/29/2002 Document Revised: 12/01/2011 Document Reviewed: 03/21/2015 Elsevier Interactive Patient Education 2016 Elsevier Inc.  Wrist Fracture A wrist fracture is a break or crack in one of the bones of your wrist. Your wrist is made up of eight small bones at the palm of your hand (carpal bones) and two  long bones that make up your forearm (radius and ulna). CAUSES  A direct blow to the wrist.  Falling on an outstretched hand.  Trauma, such as a car accident or a fall. RISK FACTORS Risk factors for wrist fracture include:  Participating in contact and high-risk sports, such as skiing, biking, and ice skating.  Taking steroid medicines.  Smoking.  Being male.  Being Caucasian.  Drinking more than three alcoholic beverages per day.  Having low or lowered bone density (osteoporosis or osteopenia).  Age. Older adults have decreased bone density.  Women who have had menopause.  History of previous fractures. SIGNS AND  SYMPTOMS Symptoms of wrist fractures include tenderness, bruising, and inflammation. Additionally, the wrist may hang in an odd position or appear deformed. DIAGNOSIS Diagnosis may include:  Physical exam.  X-ray. TREATMENT Treatment depends on many factors, including the nature and location of the fracture, your age, and your activity level. Treatment for wrist fracture can be nonsurgical or surgical. Nonsurgical Treatment A plaster cast or splint may be applied to your wrist if the bone is in a good position. If the fracture is not in good position, it may be necessary for your health care provider to realign it before applying a splint or cast. Usually, a cast or splint will be worn for several weeks. Surgical Treatment Sometimes the position of the bone is so far out of place that surgery is required to apply a device to hold it together as it heals. Depending on the fracture, there are a number of options for holding the bone in place while it heals, such as a cast and metal pins. HOME CARE INSTRUCTIONS  Keep your injured wrist elevated and move your fingers as much as possible.  Do not put pressure on any part of your cast or splint. It may break.  Use a plastic bag to protect your cast or splint from water while bathing or showering. Do not lower your cast or splint into water.  Take medicines only as directed by your health care provider.  Keep your cast or splint clean and dry. If it becomes wet, damaged, or suddenly feels too tight, contact your health care provider right away.  Do not use any tobacco products including cigarettes, chewing tobacco, or electronic cigarettes. Tobacco can delay bone healing. If you need help quitting, ask your health care provider.  Keep all follow-up visits as directed by your health care provider. This is important.  Ask your health care provider if you should take supplements of calcium and vitamins C and D to promote bone healing. SEEK  MEDICAL CARE IF:  Your cast or splint is damaged, breaks, or gets wet.  You have a fever.  You have chills.  You have continued severe pain or more swelling than you did before the cast was put on. SEEK IMMEDIATE MEDICAL CARE IF:  Your hand or fingernails on the injured arm turn blue or gray, or feel cold or numb.  You have decreased feeling in the fingers of your injured arm. MAKE SURE YOU:  Understand these instructions.  Will watch your condition.  Will get help right away if you are not doing well or get worse.   This information is not intended to replace advice given to you by your health care provider. Make sure you discuss any questions you have with your health care provider.   Document Released: 06/18/2005 Document Revised: 05/30/2015 Document Reviewed: 09/26/2011 Elsevier Interactive Patient Education [employer].

## 2015-09-28 NOTE — ED Notes (Signed)
Ortho tech aware of need for thumb spica at this time.

## 2015-09-28 NOTE — ED Provider Notes (Signed)
CSN: [id number]     Arrival date & time 09/28/15  1106 History   First MD Initiated Contact with Patient 09/28/15 1443     Chief Complaint  Patient presents with  . Fall   82 year old Caucasian male with PMH of CAD (MI 39), prostate cancer, urinary incontinence, A. fib on Coumadin, HTN, and CHF who presents today for checkup after mechanical fall yesterday. Patient was cleaning himself up with his pants around his ankles when he's slowly lost his balance and tripped falling backwards. He hit some plastic bins on the way down and struck the back of his head on the carpeted floor. Had no LOC. Was able to get up with help directly afterwards. Wife was there at the time. Has complained about right wrist pain since. Also has a little bit of neck pain on the left and right.  He denies any fevers, chills, nausea, vomiting, headache, vision changes, numbness, tingling, weakness, dysuria, hematuria, flank pain, abdominal pain, chest pain, shortness of breath.  (Consider location/radiation/quality/duration/timing/severity/associated sxs/prior Treatment) Patient is a 82 y.o. male presenting with fall.  Fall This is a new problem. The current episode started yesterday. The problem occurs rarely. The problem has been resolved. Associated symptoms include neck pain. Pertinent negatives include no abdominal pain, chest pain, chills, fever, headaches, nausea, numbness, rash, vomiting or weakness. Nothing aggravates the symptoms. He has tried nothing for the symptoms. The treatment provided no relief.    Past Medical History  Diagnosis Date  . Coronary artery disease     s/p MI 1995  . Prostate cancer ([place]) 2002  . Persistent atrial fibrillation ([place])   . 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 Hypertension   . Seizure disorder ([place])   . Anxiety   . Mitral valve regurgitation    Past Surgical History  Procedure Laterality Date  . Prostate surgery    .  Pacemaker insertion  04/29/06    Generator change ([facility]) by Dr Rayann Heman 06/21/12  . Artificial urinary sphincter    . Permanent pacemaker generator change N/A 06/08/2012    Procedure: PERMANENT PACEMAKER GENERATOR CHANGE;  Surgeon: Thompson Grayer, MD;  Location: [facility] CATH LAB;  Service: Cardiovascular;  Laterality: N/A;   Family History  Problem Relation Age of Onset  . Colon polyps Brother    Social History  Substance Use Topics  . Smoking status: Never Smoker   . Smokeless tobacco: None  . Alcohol Use: No    Review of Systems  Constitutional: Negative for fever and chills.  Respiratory: Negative for shortness of breath.   Cardiovascular: Negative for chest pain, palpitations and leg swelling.  Gastrointestinal: Negative for nausea, vomiting, abdominal pain, diarrhea, constipation and abdominal distention.  Genitourinary: Negative for dysuria, frequency, flank pain and decreased urine volume.  Musculoskeletal: Positive for neck pain. Negative for back pain and neck stiffness.       Right wrist pain  Skin: Negative for rash.  Neurological: Negative for dizziness, syncope, speech difficulty, weakness, light-headedness, numbness and headaches.  All other systems reviewed and are negative.     Allergies  Review of patient's allergies indicates no known allergies.  Home Medications   Prior to Admission medications   Medication Sig Start Date End Date Taking? Authorizing Provider  cholecalciferol (VITAMIN D) 1000 UNITS tablet Take 2,000 Units by mouth daily.    Historical Provider, MD  fish oil-omega-3 fatty acids 1000 MG capsule Take 2 g by mouth daily.  Historical Provider, MD  furosemide (LASIX) 20 MG tablet Take 2 tablets (40 mg total) by mouth daily. 09/26/15   Jettie Booze, MD  JANTOVEN 5 MG tablet TAKE AS DIRECTED BY COUMADIN CLINIC 08/28/15   Jettie Booze, MD  lisinopril (PRINIVIL,ZESTRIL) 20 MG tablet Take 10 mg by mouth daily.     Historical Provider, MD  metoprolol  succinate (TOPROL-XL) 50 MG 24 hr tablet TAKE 1/2 TABLET BY MOUTH ONCE DAILY 08/28/15   Jettie Booze, MD  Multiple Vitamin (MULTIVITAMIN WITH MINERALS) TABS Take 1 tablet by mouth daily.    Historical Provider, MD  PHENobarbital (LUMINAL) 97.2 MG tablet Take 97.2 mg by mouth at bedtime.    Historical Provider, MD  sertraline (ZOLOFT) 100 MG tablet Take 100 mg by mouth daily.    Historical Provider, MD  vitamin B-12 (CYANOCOBALAMIN) 1000 MCG tablet Take 1,000 mcg by mouth daily.    Historical Provider, MD   BP 117/61 mmHg  Pulse 69  Temp(Src) 97.8 F (36.6 C) (Oral)  Resp 18  SpO2 100% Physical Exam  Constitutional: He appears well-developed and well-nourished. No distress.  HENT:  Head: Normocephalic.  brusing to posterior scalp, no swelling, no lac.  Eyes: Pupils are equal, round, and reactive to light.  Neck: Normal range of motion. JVD present.  Cardiovascular: Normal rate, regular rhythm, normal heart sounds and intact distal pulses.  Exam reveals no gallop and no friction rub.   No murmur heard. Pulmonary/Chest: Effort normal and breath sounds normal. No respiratory distress. He has no wheezes. He has no rales. He exhibits no tenderness.  Abdominal: Soft. Bowel sounds are normal. He exhibits no distension and no mass. There is no tenderness. There is no rebound and no guarding.  Musculoskeletal: Normal range of motion. He exhibits edema. He exhibits no tenderness.  Right dorsal wrist w/swelling. Tender over dorsal distal ulna and in snuff box. Normal pulses, compartments in arm soft. Normal pulses in wrist.  Lymphadenopathy:    He has no cervical adenopathy.  Skin: Skin is warm and dry. He is not diaphoretic.  Nursing note and vitals reviewed.   ED Course  Procedures (including critical care time) Labs Review Labs Reviewed - No data to display  Imaging Review Ct Head Wo Contrast  09/28/2015  CLINICAL DATA:  Head trauma secondary to a fall yesterday. The patient struck  the back of his head. Possible loss of consciousness. EXAM: CT HEAD WITHOUT CONTRAST CT CERVICAL SPINE WITHOUT CONTRAST TECHNIQUE: Multidetector CT imaging of the head and cervical spine was performed following the standard protocol without intravenous contrast. Multiplanar CT image reconstructions of the cervical spine were also generated. COMPARISON:  CT scan dated 03/29/2013 FINDINGS: CT HEAD FINDINGS There is no acute intracranial hemorrhage, acute infarction, or intracranial mass lesion. There is an old right cerebellar infarct with secondary encephalomalacia. Benign calcifications in the cerebellum. Diffuse cerebral cortical atrophy with secondary ventricular dilatation. No osseous abnormality. Benign lipoma anterior to the the left side of the frontal bone. CT CERVICAL SPINE FINDINGS There is no fracture or subluxation or prevertebral soft tissue swelling. There is what is probably auto fusion of the C5-6 level. There is degenerative disc disease from C3-4 through Celsius 6 7. Multilevel slight facet arthritis in the mid cervical spine. IMPRESSION: 1. No acute intracranial abnormality.  Old right cerebellar infarct. 2. No acute abnormality of the cervical spine. Multilevel degenerative disc and joint disease. Auto fusion at C5-6. Electronically Signed   By: Lorriane Shire M.D.  On: 09/28/2015 13:01   Ct Cervical Spine Wo Contrast  09/28/2015  CLINICAL DATA:  Head trauma secondary to a fall yesterday. The patient struck the back of his head. Possible loss of consciousness. EXAM: CT HEAD WITHOUT CONTRAST CT CERVICAL SPINE WITHOUT CONTRAST TECHNIQUE: Multidetector CT imaging of the head and cervical spine was performed following the standard protocol without intravenous contrast. Multiplanar CT image reconstructions of the cervical spine were also generated. COMPARISON:  CT scan dated 03/29/2013 FINDINGS: CT HEAD FINDINGS There is no acute intracranial hemorrhage, acute infarction, or intracranial mass  lesion. There is an old right cerebellar infarct with secondary encephalomalacia. Benign calcifications in the cerebellum. Diffuse cerebral cortical atrophy with secondary ventricular dilatation. No osseous abnormality. Benign lipoma anterior to the the left side of the frontal bone. CT CERVICAL SPINE FINDINGS There is no fracture or subluxation or prevertebral soft tissue swelling. There is what is probably auto fusion of the C5-6 level. There is degenerative disc disease from C3-4 through Celsius 6 7. Multilevel slight facet arthritis in the mid cervical spine. IMPRESSION: 1. No acute intracranial abnormality.  Old right cerebellar infarct. 2. No acute abnormality of the cervical spine. Multilevel degenerative disc and joint disease. Auto fusion at C5-6. Electronically Signed   By: Lorriane Shire M.D.   On: 09/28/2015 13:01   I have personally reviewed and evaluated these images and lab results as part of my medical decision-making.   EKG Interpretation None      MDM   Final diagnoses:  Fall at home, initial encounter  Wrist pain, acute, right  Neck pain   82 year old Caucasian male here after mechanical fall yesterday. See history of present illness for details. On exam patient in NAD, afebrile, vitals stable. Patient has swelling to the right dorsal wrist but full range of motion. No tenderness on palpation, good pulses, compartment soft. Normal range of motion. X-ray was obtained at urgent care which shows a possible small acute fracture on an old chronic fracture at the ulnar styloid. CT head with no intracranial abnormality. Not consistent with infection or cardiac abnormality given that this was solely a mechanical trip. Has no laceration to repair. Given tylenol for pain.   Therefore stable for discharge home. Placed in thumb spica splint.  Follow up to ortho in 1 week. DC home. Strict return if any altered mental status or focal neural deficits.  Pt was seen under the supervision of Dr.  Tyrone Nine.     Sherian Maroon, MD 09/28/15 [place], DO 09/28/15 [id number]

## 2015-09-28 NOTE — ED Provider Notes (Signed)
I have personally seen and examined the patient.  I have discussed the plan of care with the resident.  I have reviewed the documentation on PMH/FH/Soc. History.  I have reviewed the documentation of the resident and agree.   EKG Interpretation  Date/Time:    Ventricular Rate:    PR Interval:    QRS Duration:   QT Interval:    QTC Calculation:   R Axis:     Text Interpretation:        82 yo M with a cc of a fall.  This happened yesterday, mechanical fall, hit his head and R wrist.  Having pain to neck as well.  Well appearing, non toxic, bruising to the medial aspect of the right wrist with mild TTP.  Full ROM, PMS intact distally.  Outside images reviewed, no obvious fx, well align, will place in removable splint, ortho follow up.  CT head and c spine negative. D/c home.   Deno Etienne, DO 09/28/15 1611

## 2015-09-28 NOTE — Telephone Encounter (Signed)
Lmptcb for lab results 

## 2015-09-28 NOTE — ED Notes (Signed)
Pt here for fall yesterday afternoon. sts trippd over his pants. sts hit back of head and injured right wrist. Pt has splint to right wrist and was x rayed at [facility]. Pt sent here for CT scan of his head due to blood thinners.

## 2015-10-03 ENCOUNTER — Ambulatory Visit (INDEPENDENT_AMBULATORY_CARE_PROVIDER_SITE_OTHER): Payer: Medicare Other | Admitting: Pharmacist

## 2015-10-03 DIAGNOSIS — Z7901 Long term (current) use of anticoagulants: Secondary | ICD-10-CM

## 2015-10-03 DIAGNOSIS — M25531 Pain in right wrist: Secondary | ICD-10-CM | POA: Diagnosis not present

## 2015-10-03 DIAGNOSIS — I4891 Unspecified atrial fibrillation: Secondary | ICD-10-CM

## 2015-10-03 DIAGNOSIS — S6991XA Unspecified injury of right wrist, hand and finger(s), initial encounter: Secondary | ICD-10-CM | POA: Diagnosis not present

## 2015-10-03 DIAGNOSIS — I482 Chronic atrial fibrillation, unspecified: Secondary | ICD-10-CM

## 2015-10-03 DIAGNOSIS — S63501A Unspecified sprain of right wrist, initial encounter: Secondary | ICD-10-CM | POA: Diagnosis not present

## 2015-10-03 DIAGNOSIS — Z5181 Encounter for therapeutic drug level monitoring: Secondary | ICD-10-CM

## 2015-10-03 LAB — POCT INR: INR: 2.8

## 2015-10-04 ENCOUNTER — Other Ambulatory Visit: Payer: Self-pay

## 2015-10-04 DIAGNOSIS — I10 Essential (primary) hypertension: Secondary | ICD-10-CM

## 2015-10-08 ENCOUNTER — Ambulatory Visit (INDEPENDENT_AMBULATORY_CARE_PROVIDER_SITE_OTHER): Payer: Medicare Other | Admitting: *Deleted

## 2015-10-08 ENCOUNTER — Other Ambulatory Visit (INDEPENDENT_AMBULATORY_CARE_PROVIDER_SITE_OTHER): Payer: Medicare Other | Admitting: *Deleted

## 2015-10-08 DIAGNOSIS — I10 Essential (primary) hypertension: Secondary | ICD-10-CM | POA: Diagnosis not present

## 2015-10-08 DIAGNOSIS — I495 Sick sinus syndrome: Secondary | ICD-10-CM | POA: Diagnosis not present

## 2015-10-08 LAB — BASIC METABOLIC PANEL
BUN: 31 mg/dL — ABNORMAL HIGH (ref 7–25)
CALCIUM: 9.2 mg/dL (ref 8.6–10.3)
CO2: 25 mmol/L (ref 20–31)
Chloride: 90 mmol/L — ABNORMAL LOW (ref 98–110)
Creat: 1.28 mg/dL — ABNORMAL HIGH (ref 0.70–1.11)
GLUCOSE: 81 mg/dL (ref 65–99)
Potassium: 4.7 mmol/L (ref 3.5–5.3)
SODIUM: 123 mmol/L — AB (ref 135–146)

## 2015-10-08 NOTE — Addendum Note (Signed)
Addended by: Eulis Foster on: 10/08/2015 10:46 AM   Modules accepted: Orders

## 2015-10-09 ENCOUNTER — Telehealth: Payer: Self-pay | Admitting: Interventional Cardiology

## 2015-10-09 NOTE — Telephone Encounter (Signed)
Low sodium stable.  Is he restricting fluid intake?

## 2015-10-09 NOTE — Progress Notes (Signed)
Remote pacemaker transmission.   

## 2015-10-11 ENCOUNTER — Telehealth: Payer: Self-pay | Admitting: Interventional Cardiology

## 2015-10-11 NOTE — Telephone Encounter (Signed)
COntinue Lasix 40 mg daily.

## 2015-10-11 NOTE — Telephone Encounter (Signed)
**Note De-Identified  Obfuscation** The pts wife is advised and she verbalized understanding.

## 2015-10-11 NOTE — Telephone Encounter (Signed)
lmtcb

## 2015-10-11 NOTE — Telephone Encounter (Addendum)
The pts wife states that the pt is drinking about 32 oz. of fluid daily and that he is on a low sodium diet.  Also, the pts wife called the office on 09/25/15 stating that the pts weight was increased to 131 lbs and that he had edema in his legs ankles and feet. She was advised per Dr Irish Lack, at that time, that the pt should increase his Furosemide from 20 mg daily to 40 mg daily.  She states now that the pt is back down to his normal weight of 123 lbs and wants to know if it should be decreased back down to 20 mg daily.  Please advise.

## 2015-10-11 NOTE — Telephone Encounter (Signed)
New Message  Pt wife calling to speak w/ RN concerning lab work. Please call back and discuss.

## 2015-10-12 LAB — CUP PACEART REMOTE DEVICE CHECK
Battery Remaining Longevity: 114 mo
Battery Remaining Percentage: 100 %
Brady Statistic RA Percent Paced: 0 %
Date Time Interrogation Session: 20170116054100
Implantable Lead Implant Date: 20130917
Implantable Lead Location: 753859
Implantable Lead Model: 4456
Implantable Lead Model: 4469
Implantable Lead Serial Number: 448722
Lead Channel Impedance Value: 370 Ohm
Lead Channel Sensing Intrinsic Amplitude: 19.5 mV
Lead Channel Setting Sensing Sensitivity: 2.5 mV
MDC IDC LEAD IMPLANT DT: 20130917
MDC IDC LEAD LOCATION: 753860
MDC IDC LEAD SERIAL: 484382
MDC IDC MSMT LEADCHNL RA IMPEDANCE VALUE: 396 Ohm
MDC IDC PG SERIAL: 111635
MDC IDC SET LEADCHNL RV PACING AMPLITUDE: 2.4 V
MDC IDC SET LEADCHNL RV PACING PULSEWIDTH: 0.4 ms
MDC IDC STAT BRADY RV PERCENT PACED: 97 %

## 2015-10-13 ENCOUNTER — Telehealth: Payer: Self-pay | Admitting: Physician Assistant

## 2015-10-13 DIAGNOSIS — I10 Essential (primary) hypertension: Secondary | ICD-10-CM

## 2015-10-13 NOTE — Telephone Encounter (Signed)
Patient's wife called Saturday answering service. She says she got to thinking about the lab results that were called to her 2 days ago and remains concerned about the patient's sodium level being 123 on 10/08/15. (2 weeks prior his level was 122 - has been ranging 122-129 recently. The only prior we have to compare to before then was 3 years ago when it was normal at 135. His wife reports several years back he got as low as 119 on Lasix.)   She wanted to make sure Dr. Irish Lack had been aware of the level and wanted to confirm the dose of Lasix he recommended. Per phone note on 10/10/14, Dr. Irish Lack recommended to continue Lasix 40mg  daily.   From a symptom standpoint there have been no new acute changes. Ms. Malis reports that her husband sleeps a lot during the day but this is not unusual for him. He also has mild dementia and this is unchanged as well. His weight has gone back down to previous dry weight, which has gradually decreased over the last year - shesays the doctors have felt this is possibly due to loss of body weight as well.  I reviewed the notes in the computer with her and told her I would forward her concerns to Dr. Irish Lack to see if he has any new thoughts on the matter.  Dayna Dunn PA-C

## 2015-10-16 NOTE — Telephone Encounter (Signed)
OK to continue Lasix as he should lose more water than sodium and the concentration of sodium should increase.  Also, since he is not having sx, would continue to try to restrict fluid intake and monitor.  Repeat BMet in 2 weeks.

## 2015-10-17 NOTE — Telephone Encounter (Signed)
Instructed patient's DPR to continue Lasix, restrict fluid intake and monitor.  BMET scheduled for 2/8. DPR agrees with treatment plan.

## 2015-10-17 NOTE — Telephone Encounter (Signed)
Forwarding note to triage to let patient know.

## 2015-10-17 NOTE — Addendum Note (Signed)
Addended by: Harland German A on: 10/17/2015 08:42 AM   Modules accepted: Orders

## 2015-10-19 ENCOUNTER — Encounter: Payer: Self-pay | Admitting: Cardiology

## 2015-10-31 ENCOUNTER — Other Ambulatory Visit (INDEPENDENT_AMBULATORY_CARE_PROVIDER_SITE_OTHER): Payer: Medicare Other

## 2015-10-31 DIAGNOSIS — I10 Essential (primary) hypertension: Secondary | ICD-10-CM

## 2015-10-31 LAB — BASIC METABOLIC PANEL
BUN: 52 mg/dL — AB (ref 7–25)
CO2: 25 mmol/L (ref 20–31)
CREATININE: 1.38 mg/dL — AB (ref 0.70–1.11)
Calcium: 9 mg/dL (ref 8.6–10.3)
Chloride: 101 mmol/L (ref 98–110)
Glucose, Bld: 93 mg/dL (ref 65–99)
Potassium: 4.6 mmol/L (ref 3.5–5.3)
Sodium: 135 mmol/L (ref 135–146)

## 2015-11-14 ENCOUNTER — Ambulatory Visit (INDEPENDENT_AMBULATORY_CARE_PROVIDER_SITE_OTHER): Payer: Medicare Other | Admitting: *Deleted

## 2015-11-14 DIAGNOSIS — Z7901 Long term (current) use of anticoagulants: Secondary | ICD-10-CM | POA: Diagnosis not present

## 2015-11-14 DIAGNOSIS — I482 Chronic atrial fibrillation, unspecified: Secondary | ICD-10-CM

## 2015-11-14 DIAGNOSIS — I4891 Unspecified atrial fibrillation: Secondary | ICD-10-CM | POA: Diagnosis not present

## 2015-11-14 DIAGNOSIS — Z5181 Encounter for therapeutic drug level monitoring: Secondary | ICD-10-CM | POA: Diagnosis not present

## 2015-11-14 LAB — POCT INR: INR: 3.2

## 2015-11-28 ENCOUNTER — Ambulatory Visit (INDEPENDENT_AMBULATORY_CARE_PROVIDER_SITE_OTHER): Payer: Medicare Other | Admitting: *Deleted

## 2015-11-28 DIAGNOSIS — Z5181 Encounter for therapeutic drug level monitoring: Secondary | ICD-10-CM

## 2015-11-28 DIAGNOSIS — I482 Chronic atrial fibrillation, unspecified: Secondary | ICD-10-CM

## 2015-11-28 DIAGNOSIS — Z7901 Long term (current) use of anticoagulants: Secondary | ICD-10-CM

## 2015-11-28 DIAGNOSIS — I4891 Unspecified atrial fibrillation: Secondary | ICD-10-CM

## 2015-11-28 LAB — POCT INR: INR: 2.5

## 2015-12-19 ENCOUNTER — Ambulatory Visit (INDEPENDENT_AMBULATORY_CARE_PROVIDER_SITE_OTHER): Payer: Medicare Other | Admitting: *Deleted

## 2015-12-19 ENCOUNTER — Encounter: Payer: Self-pay | Admitting: Interventional Cardiology

## 2015-12-19 ENCOUNTER — Ambulatory Visit (INDEPENDENT_AMBULATORY_CARE_PROVIDER_SITE_OTHER): Payer: Medicare Other | Admitting: Interventional Cardiology

## 2015-12-19 VITALS — BP 130/60 | HR 61 | Ht 66.0 in | Wt 119.8 lb

## 2015-12-19 DIAGNOSIS — Z5181 Encounter for therapeutic drug level monitoring: Secondary | ICD-10-CM

## 2015-12-19 DIAGNOSIS — Z7901 Long term (current) use of anticoagulants: Secondary | ICD-10-CM

## 2015-12-19 DIAGNOSIS — I482 Chronic atrial fibrillation, unspecified: Secondary | ICD-10-CM

## 2015-12-19 DIAGNOSIS — I059 Rheumatic mitral valve disease, unspecified: Secondary | ICD-10-CM | POA: Diagnosis not present

## 2015-12-19 DIAGNOSIS — I4891 Unspecified atrial fibrillation: Secondary | ICD-10-CM | POA: Diagnosis not present

## 2015-12-19 DIAGNOSIS — I10 Essential (primary) hypertension: Secondary | ICD-10-CM

## 2015-12-19 LAB — POCT INR: INR: 2.2

## 2015-12-19 MED ORDER — FUROSEMIDE 20 MG PO TABS: 20.0000 mg | ORAL_TABLET | Freq: Every day | ORAL | Status: DC

## 2015-12-19 NOTE — Progress Notes (Deleted)
   Patient ID: Eddie Park, male    DOB: July 19, 1927, 82 y.o.   MRN: [id number]  HPI    Review of Systems    Physical Exam

## 2015-12-19 NOTE — Patient Instructions (Signed)
Medication Instructions:  CHANGE LASIX TO 20 MG DAILY;   Labwork: NONE  Testing/Procedures: NONE  Follow-Up: Your physician wants you to follow-up in: [place] DR. VARANASI. You will receive a reminder letter in the mail two months in advance. If you don't receive a letter, please call our office to schedule the follow-up appointment.   Any Other Special Instructions Will Be Listed Below (If Applicable).     If you need a refill on your cardiac medications before your next appointment, please call your pharmacy.

## 2015-12-19 NOTE — Progress Notes (Signed)
Patient ID: Eddie Park, male   DOB: September 30, 1926, 82 y.o.   MRN: [id number]     Cardiology Office Note   Date:  12/19/2015   ID:  Eddie Park, DOB 09/25/1926, MRN [id number]  PCP:  Gerrit Heck, MD    No chief complaint on file. AFib   Wt Readings from Last 3 Encounters:  12/19/15 119 lb 12.8 oz (54.341 kg)  07/09/15 128 lb 12.8 oz (58.423 kg)  02/08/15 123 lb 12.8 oz (56.155 kg)       History of Present Illness: Eddie Park is a 82 y.o. male  with AFib, mitral regurgitation, cardiomyopathy, and pacer dependent. He is now in AFib 100% of the time by pacer check. EF 45% in 2011.  He had been falling a few months ago.  His wife says that he was careless.  He is using a cane.  His Lasix was decreased and he is falling less.  He still has some residual bruising below the right eye.  Still taking Coumadin.  No other bleeding issues.    Fluid management has been stable.      Past Medical History  Diagnosis Date  . Coronary artery disease     s/p MI 1995  . Prostate cancer ([place]) 2002  . Persistent atrial fibrillation ([place])   . Tachycardia-bradycardia syndrome ([place])     with syncope, s/p PPM by Dr Leonia Reeves  . Hypertension   . Seizure disorder ([place])   . Anxiety   . Mitral valve regurgitation     Past Surgical History  Procedure Laterality Date  . Prostate surgery    . Pacemaker insertion  04/29/06    Generator change ([facility]) by Dr Rayann Heman 06/21/12  . Artificial urinary sphincter    . Permanent pacemaker generator change N/A 06/08/2012    Procedure: PERMANENT PACEMAKER GENERATOR CHANGE;  Surgeon: Thompson Grayer, MD;  Location: [facility] CATH LAB;  Service: Cardiovascular;  Laterality: N/A;     Current Outpatient Prescriptions  Medication Sig Dispense Refill  . cholecalciferol (VITAMIN D) 1000 UNITS tablet Take 2,000 Units by mouth daily.    . fish oil-omega-3 fatty acids 1000 MG capsule Take 2 g by mouth daily.    . furosemide (LASIX) 20 MG tablet Take 2  tablets (40 mg total) by mouth daily. 180 tablet 1  . JANTOVEN 5 MG tablet TAKE AS DIRECTED BY COUMADIN CLINIC 150 tablet 1  . lisinopril (PRINIVIL,ZESTRIL) 20 MG tablet Take 10 mg by mouth daily.     . metoprolol succinate (TOPROL-XL) 50 MG 24 hr tablet TAKE 1/2 TABLET BY MOUTH ONCE DAILY 45 tablet 1  . Multiple Vitamin (MULTIVITAMIN WITH MINERALS) TABS Take 1 tablet by mouth daily.    [name] PHENobarbital (LUMINAL) 97.2 MG tablet Take 97.2 mg by mouth at bedtime.    . sertraline (ZOLOFT) 100 MG tablet Take 100 mg by mouth daily.    . vitamin B-12 (CYANOCOBALAMIN) 1000 MCG tablet Take 1,000 mcg by mouth daily.     No current facility-administered medications for this visit.    Allergies:   Review of patient's allergies indicates no known allergies.    Social History:  The patient  reports that he has never smoked. He does not have any smokeless tobacco history on file. He reports that he does not drink alcohol or use illicit drugs.   Family History:  The patient's family history includes Colon polyps in his brother; Heart attack in his brother and father.    ROS:  Please  see the history of present illness.   Otherwise, review of systems are positive for bruising.   All other systems are reviewed and negative.    PHYSICAL EXAM: VS:  BP 130/60 mmHg  Pulse 61  Ht 5\' 6"  (1.676 m)  Wt 119 lb 12.8 oz (54.341 kg)  BMI 19.35 kg/m2 , BMI Body mass index is 19.35 kg/(m^2). GEN: Well nourished, well developed, in no acute distress HEENT: normal Neck: no JVD, carotid bruits, or masses Cardiac: RRR; no murmurs, rubs, or gallops,no edema  Respiratory:  clear to auscultation bilaterally, normal work of breathing GI: soft, nontender, nondistended, + BS MS: no deformity or atrophy Skin: warm and dry, no rash, few scattered bruises Neuro:  Strength and sensation are intact Psych: euthymic mood, full affect    Recent Labs: 10/31/2015: BUN 52*; Creat 1.38*; Potassium 4.6; Sodium 135   Lipid  Panel    Component Value Date/Time   CHOL  09/26/2009 2344    142        ATP III CLASSIFICATION:  <200     mg/dL   Desirable  200-239  mg/dL   Borderline High  >=240    mg/dL   High          TRIG 65 09/26/2009 2344   HDL 42 09/26/2009 2344   CHOLHDL 3.4 09/26/2009 2344   VLDL 13 09/26/2009 2344   LDLCALC  09/26/2009 2344    87        Total Cholesterol/HDL:CHD Risk Coronary Heart Disease Risk Table                     Men   Women  1/2 Average Risk   3.4   3.3  Average Risk       5.0   4.4  2 X Average Risk   9.6   7.1  3 X Average Risk  23.4   11.0        Use the calculated Patient Ratio above and the CHD Risk Table to determine the patient's CHD Risk.        ATP III CLASSIFICATION (LDL):  <100     mg/dL   Optimal  100-129  mg/dL   Near or Above                    Optimal  130-159  mg/dL   Borderline  160-189  mg/dL   High  >190     mg/dL   Very High     Other studies Reviewed: Additional studies/ records that were reviewed today with results demonstrating: EF 45-50%.   ASSESSMENT AND PLAN:  1. AFib: Discussed Coumadin.  He has been more stable of late.   Rate controlled.  We discussed possibility of continuing versus coming off of Coumadin. The patient and his wife prefer that he stay on Coumadin at this point. He has been more stable on his feet. If he is careful, he will avoid falls. Continue current rate control meds. 2. HTN: BP stable.  Continue current meds. 3. Chronic diastolic heart failure: Appears euvolemic. Continue current diuretics. 4. Mitral regurgitation: Noted to be severe in the past. At this point, I don't think he would be a candidate for any type of surgical repair.   Current medicines are reviewed at length with the patient today.  The patient concerns regarding his medicines were addressed.  The following changes have been made:  No change  Labs/ tests ordered today include:  No orders  of the defined types were placed in this encounter.     Recommend 150 minutes/week of aerobic exercise Low fat, low carb, high fiber diet recommended  Disposition:   FU in 1 year    Teresita Madura., MD  12/19/2015 11:45 AM    [facility] [address], [place],   [address] Phone: [phone]; Fax: [phone]

## 2016-01-07 ENCOUNTER — Ambulatory Visit (INDEPENDENT_AMBULATORY_CARE_PROVIDER_SITE_OTHER): Payer: Medicare Other | Admitting: *Deleted

## 2016-01-07 DIAGNOSIS — I495 Sick sinus syndrome: Secondary | ICD-10-CM

## 2016-01-07 NOTE — Progress Notes (Signed)
Remote pacemaker transmission.   

## 2016-01-16 ENCOUNTER — Ambulatory Visit (INDEPENDENT_AMBULATORY_CARE_PROVIDER_SITE_OTHER): Payer: Medicare Other | Admitting: *Deleted

## 2016-01-16 DIAGNOSIS — Z7901 Long term (current) use of anticoagulants: Secondary | ICD-10-CM

## 2016-01-16 DIAGNOSIS — I4891 Unspecified atrial fibrillation: Secondary | ICD-10-CM | POA: Diagnosis not present

## 2016-01-16 DIAGNOSIS — I482 Chronic atrial fibrillation, unspecified: Secondary | ICD-10-CM

## 2016-01-16 DIAGNOSIS — Z5181 Encounter for therapeutic drug level monitoring: Secondary | ICD-10-CM | POA: Diagnosis not present

## 2016-01-16 DIAGNOSIS — E0590 Thyrotoxicosis, unspecified without thyrotoxic crisis or storm: Secondary | ICD-10-CM | POA: Diagnosis not present

## 2016-01-16 LAB — POCT INR: INR: 2.9

## 2016-01-30 DIAGNOSIS — D649 Anemia, unspecified: Secondary | ICD-10-CM | POA: Diagnosis not present

## 2016-02-05 DIAGNOSIS — D649 Anemia, unspecified: Secondary | ICD-10-CM | POA: Diagnosis not present

## 2016-02-14 ENCOUNTER — Encounter: Payer: Self-pay | Admitting: Cardiology

## 2016-02-14 LAB — CUP PACEART REMOTE DEVICE CHECK
Battery Remaining Percentage: 100 %
Date Time Interrogation Session: 20170417054600
Implantable Lead Implant Date: 20130917
Implantable Lead Implant Date: 20130917
Implantable Lead Location: 753860
Lead Channel Impedance Value: 320 Ohm
Lead Channel Impedance Value: 337 Ohm
Lead Channel Setting Pacing Amplitude: 2.4 V
Lead Channel Setting Pacing Pulse Width: 0.4 ms
MDC IDC LEAD LOCATION: 753859
MDC IDC LEAD SERIAL: 448722
MDC IDC LEAD SERIAL: 484382
MDC IDC MSMT BATTERY REMAINING LONGEVITY: 108 mo
MDC IDC SET LEADCHNL RV SENSING SENSITIVITY: 2.5 mV
MDC IDC STAT BRADY RA PERCENT PACED: 0 %
MDC IDC STAT BRADY RV PERCENT PACED: 97 %
Pulse Gen Serial Number: 111635

## 2016-02-19 ENCOUNTER — Other Ambulatory Visit: Payer: Self-pay | Admitting: Interventional Cardiology

## 2016-02-20 ENCOUNTER — Ambulatory Visit (INDEPENDENT_AMBULATORY_CARE_PROVIDER_SITE_OTHER): Payer: Medicare Other | Admitting: *Deleted

## 2016-02-20 DIAGNOSIS — Z5181 Encounter for therapeutic drug level monitoring: Secondary | ICD-10-CM

## 2016-02-20 DIAGNOSIS — Z7901 Long term (current) use of anticoagulants: Secondary | ICD-10-CM | POA: Diagnosis not present

## 2016-02-20 DIAGNOSIS — I482 Chronic atrial fibrillation, unspecified: Secondary | ICD-10-CM

## 2016-02-20 DIAGNOSIS — I4891 Unspecified atrial fibrillation: Secondary | ICD-10-CM

## 2016-02-20 LAB — POCT INR: INR: 1.6

## 2016-02-26 ENCOUNTER — Other Ambulatory Visit: Payer: Self-pay | Admitting: Interventional Cardiology

## 2016-03-03 ENCOUNTER — Encounter: Payer: Self-pay | Admitting: Cardiology

## 2016-03-03 DIAGNOSIS — Z961 Presence of intraocular lens: Secondary | ICD-10-CM | POA: Diagnosis not present

## 2016-03-05 ENCOUNTER — Ambulatory Visit (INDEPENDENT_AMBULATORY_CARE_PROVIDER_SITE_OTHER): Payer: Medicare Other | Admitting: *Deleted

## 2016-03-05 DIAGNOSIS — I4891 Unspecified atrial fibrillation: Secondary | ICD-10-CM

## 2016-03-05 DIAGNOSIS — Z7901 Long term (current) use of anticoagulants: Secondary | ICD-10-CM

## 2016-03-05 DIAGNOSIS — I482 Chronic atrial fibrillation, unspecified: Secondary | ICD-10-CM

## 2016-03-05 DIAGNOSIS — Z5181 Encounter for therapeutic drug level monitoring: Secondary | ICD-10-CM | POA: Diagnosis not present

## 2016-03-05 LAB — POCT INR: INR: 1.8

## 2016-03-19 ENCOUNTER — Ambulatory Visit (INDEPENDENT_AMBULATORY_CARE_PROVIDER_SITE_OTHER): Payer: Medicare Other | Admitting: *Deleted

## 2016-03-19 DIAGNOSIS — I4891 Unspecified atrial fibrillation: Secondary | ICD-10-CM | POA: Diagnosis not present

## 2016-03-19 DIAGNOSIS — I482 Chronic atrial fibrillation, unspecified: Secondary | ICD-10-CM

## 2016-03-19 DIAGNOSIS — Z5181 Encounter for therapeutic drug level monitoring: Secondary | ICD-10-CM | POA: Diagnosis not present

## 2016-03-19 DIAGNOSIS — Z7901 Long term (current) use of anticoagulants: Secondary | ICD-10-CM

## 2016-03-19 LAB — POCT INR: INR: 2.1

## 2016-03-27 ENCOUNTER — Other Ambulatory Visit: Payer: Self-pay | Admitting: Interventional Cardiology

## 2016-04-07 ENCOUNTER — Ambulatory Visit (INDEPENDENT_AMBULATORY_CARE_PROVIDER_SITE_OTHER): Payer: Medicare Other | Admitting: *Deleted

## 2016-04-07 DIAGNOSIS — I495 Sick sinus syndrome: Secondary | ICD-10-CM

## 2016-04-07 NOTE — Progress Notes (Signed)
Remote pacemaker transmission.   

## 2016-04-09 ENCOUNTER — Ambulatory Visit (INDEPENDENT_AMBULATORY_CARE_PROVIDER_SITE_OTHER): Payer: Medicare Other | Admitting: *Deleted

## 2016-04-09 ENCOUNTER — Encounter: Payer: Self-pay | Admitting: Cardiology

## 2016-04-09 DIAGNOSIS — I482 Chronic atrial fibrillation, unspecified: Secondary | ICD-10-CM

## 2016-04-09 DIAGNOSIS — Z7901 Long term (current) use of anticoagulants: Secondary | ICD-10-CM | POA: Diagnosis not present

## 2016-04-09 DIAGNOSIS — I4891 Unspecified atrial fibrillation: Secondary | ICD-10-CM

## 2016-04-09 DIAGNOSIS — Z5181 Encounter for therapeutic drug level monitoring: Secondary | ICD-10-CM

## 2016-04-09 LAB — POCT INR: INR: 1.7

## 2016-04-10 LAB — CUP PACEART REMOTE DEVICE CHECK
Brady Statistic RV Percent Paced: 98 %
Date Time Interrogation Session: 20170717044100
Implantable Lead Implant Date: 20130917
Implantable Lead Location: 753860
Implantable Lead Model: 4469
Implantable Lead Serial Number: 484382
Lead Channel Impedance Value: 381 Ohm
Lead Channel Setting Pacing Amplitude: 2.4 V
Lead Channel Setting Sensing Sensitivity: 2.5 mV
MDC IDC LEAD IMPLANT DT: 20130917
MDC IDC LEAD LOCATION: 753859
MDC IDC LEAD SERIAL: 448722
MDC IDC MSMT BATTERY REMAINING LONGEVITY: 108 mo
MDC IDC MSMT BATTERY REMAINING PERCENTAGE: 100 %
MDC IDC MSMT LEADCHNL RV IMPEDANCE VALUE: 359 Ohm
MDC IDC SET LEADCHNL RV PACING PULSEWIDTH: 0.4 ms
MDC IDC STAT BRADY RA PERCENT PACED: 0 %
Pulse Gen Serial Number: 111635

## 2016-04-18 ENCOUNTER — Emergency Department (HOSPITAL_BASED_OUTPATIENT_CLINIC_OR_DEPARTMENT_OTHER)
Admission: EM | Admit: 2016-04-18 | Discharge: 2016-04-18 | Disposition: A | Discharge status: Home / Self Care | Payer: Medicare Other | Attending: Emergency Medicine | Admitting: Emergency Medicine

## 2016-04-18 ENCOUNTER — Encounter (HOSPITAL_BASED_OUTPATIENT_CLINIC_OR_DEPARTMENT_OTHER): Payer: Self-pay | Admitting: *Deleted

## 2016-04-18 DIAGNOSIS — Z79899 Other long term (current) drug therapy: Secondary | ICD-10-CM | POA: Diagnosis not present

## 2016-04-18 DIAGNOSIS — I351 Nonrheumatic aortic (valve) insufficiency: Secondary | ICD-10-CM | POA: Diagnosis not present

## 2016-04-18 DIAGNOSIS — I4891 Unspecified atrial fibrillation: Secondary | ICD-10-CM | POA: Diagnosis not present

## 2016-04-18 DIAGNOSIS — I2510 Atherosclerotic heart disease of native coronary artery without angina pectoris: Secondary | ICD-10-CM | POA: Diagnosis not present

## 2016-04-18 DIAGNOSIS — F339 Major depressive disorder, recurrent, unspecified: Secondary | ICD-10-CM | POA: Diagnosis not present

## 2016-04-18 DIAGNOSIS — I10 Essential (primary) hypertension: Secondary | ICD-10-CM | POA: Insufficient documentation

## 2016-04-18 DIAGNOSIS — N183 Chronic kidney disease, stage 3 (moderate): Secondary | ICD-10-CM | POA: Diagnosis not present

## 2016-04-18 DIAGNOSIS — D649 Anemia, unspecified: Secondary | ICD-10-CM | POA: Insufficient documentation

## 2016-04-18 DIAGNOSIS — Z950 Presence of cardiac pacemaker: Secondary | ICD-10-CM | POA: Diagnosis not present

## 2016-04-18 DIAGNOSIS — I5022 Chronic systolic (congestive) heart failure: Secondary | ICD-10-CM | POA: Diagnosis not present

## 2016-04-18 DIAGNOSIS — Z8546 Personal history of malignant neoplasm of prostate: Secondary | ICD-10-CM | POA: Diagnosis not present

## 2016-04-18 DIAGNOSIS — G40909 Epilepsy, unspecified, not intractable, without status epilepticus: Secondary | ICD-10-CM | POA: Diagnosis not present

## 2016-04-18 DIAGNOSIS — E871 Hypo-osmolality and hyponatremia: Secondary | ICD-10-CM | POA: Diagnosis not present

## 2016-04-18 DIAGNOSIS — E7800 Pure hypercholesterolemia, unspecified: Secondary | ICD-10-CM | POA: Diagnosis not present

## 2016-04-18 DIAGNOSIS — Z7901 Long term (current) use of anticoagulants: Secondary | ICD-10-CM | POA: Diagnosis not present

## 2016-04-18 DIAGNOSIS — R7989 Other specified abnormal findings of blood chemistry: Secondary | ICD-10-CM | POA: Diagnosis present

## 2016-04-18 LAB — BASIC METABOLIC PANEL
Anion gap: 9 (ref 5–15)
BUN: 30 mg/dL — AB (ref 6–20)
CALCIUM: 8.7 mg/dL — AB (ref 8.9–10.3)
CO2: 24 mmol/L (ref 22–32)
CREATININE: 1.28 mg/dL — AB (ref 0.61–1.24)
Chloride: 93 mmol/L — ABNORMAL LOW (ref 101–111)
GFR calc Af Amer: 56 mL/min — ABNORMAL LOW (ref >60)
GFR, EST NON AFRICAN AMERICAN: 49 mL/min — AB (ref >60)
Glucose, Bld: 93 mg/dL (ref 65–99)
Potassium: 4.6 mmol/L (ref 3.5–5.1)
SODIUM: 126 mmol/L — AB (ref 135–145)

## 2016-04-18 LAB — CBC
HCT: 29.1 % — ABNORMAL LOW (ref 39.0–52.0)
Hemoglobin: 9.7 g/dL — ABNORMAL LOW (ref 13.0–17.0)
MCH: 31.2 pg (ref 26.0–34.0)
MCHC: 33.3 g/dL (ref 30.0–36.0)
MCV: 93.6 fL (ref 78.0–100.0)
PLATELETS: 105 10*3/uL — AB (ref 150–400)
RBC: 3.11 MIL/uL — ABNORMAL LOW (ref 4.22–5.81)
RDW: 11.7 % (ref 11.5–15.5)
WBC: 3.6 10*3/uL — ABNORMAL LOW (ref 4.0–10.5)

## 2016-04-18 MED ORDER — SODIUM CHLORIDE 0.9 % IV BOLUS (SEPSIS)
500.0000 mL | Freq: Once | INTRAVENOUS | Status: AC
  Administered 2016-04-18: 500 mL via INTRAVENOUS

## 2016-04-18 NOTE — ED Triage Notes (Signed)
His MD called and said he needed to come to the ED due to abnormal sodium.

## 2016-04-18 NOTE — ED Provider Notes (Signed)
[facility] DEPT [facility] Provider Note   CSN: [id number] Arrival date & time: 04/18/16  1757  By signing my name below, I, Gwenlyn Fudge, attest that this documentation has been prepared under the direction and in the presence of Carlisle Cater, PA-C. Electronically Signed: Gwenlyn Fudge, ED Scribe. 04/18/16. 6:37 PM.  First MD Initiated Contact with Patient 04/18/16 1813     History   Chief Complaint Chief Complaint  Patient presents with  . Abnormal Lab    The history is provided by the patient and the spouse. No language interpreter was used.    HPI Comments: Eddie Park is a 82 y.o. male with PMHx of HTN, Seizures on phenobarbital, and CAD, CHF? On lasix as needed who presents to the Emergency Department with an abnormal lab finding. Wife states his sodium levels were considered low (128) when tested. Pt wife reports the last few times he has had labs, pt has aslo been anemic. Pt wife states she thinks he might be dehydrated. Wife states that in December 2016, Sodium levels had dropped to 124. Pt has not had to have any recent transfusions. She states he has no pains, nausea, vomiting, diarrhea, leg swelling.    Past Medical History:  Diagnosis Date  . Anxiety   . Coronary artery disease    s/p MI 1995  . Hypertension   . Mitral valve regurgitation   . Persistent atrial fibrillation ([place])   . Prostate cancer ([place]) 2002  . Seizure disorder ([place])   . Tachycardia-bradycardia syndrome ([place])    with syncope, s/p PPM by Dr Leonia Reeves    Patient Active Problem List   Diagnosis Date Noted  . Coronary atherosclerosis of native coronary artery 01/25/2014  . Mitral valve disorder 01/25/2014  . Aortic valve disorders 01/25/2014  . Encounter for therapeutic drug monitoring 10/19/2013  . Long term current use of anticoagulant therapy 07/21/2013  . [employer] 06/10/2012  . Atrial fibrillation ([place]) 06/08/2012  . Tachycardia-bradycardia syndrome ([place]) 06/08/2012  .  Hypertension 06/08/2012    Past Surgical History:  Procedure Laterality Date  . artificial urinary sphincter    . PACEMAKER INSERTION  04/29/06   Generator change ([facility]) by Dr Rayann Heman 06/21/12  . PERMANENT PACEMAKER GENERATOR CHANGE N/A 06/08/2012   Procedure: PERMANENT PACEMAKER GENERATOR CHANGE;  Surgeon: Thompson Grayer, MD;  Location: [facility] CATH LAB;  Service: Cardiovascular;  Laterality: N/A;  . PROSTATE SURGERY       Home Medications    Prior to Admission medications   Medication Sig Start Date End Date Taking? Authorizing Provider  cholecalciferol (VITAMIN D) 1000 UNITS tablet Take 2,000 Units by mouth daily.    Historical Provider, MD  fish oil-omega-3 fatty acids 1000 MG capsule Take 2 g by mouth daily.    Historical Provider, MD  furosemide (LASIX) 20 MG tablet Take 1 tablet (20 mg total) by mouth daily. 12/19/15   Jettie Booze, MD  JANTOVEN 5 MG tablet TAKE AS DIRECTED BY COUMADIN CLINIC 02/26/16   Jettie Booze, MD  lisinopril (PRINIVIL,ZESTRIL) 20 MG tablet TAKE 1 TABLET BY MOUTH ONCE A DAY 03/27/16   Jettie Booze, MD  metoprolol succinate (TOPROL-XL) 50 MG 24 hr tablet TAKE 1/2 TABLET BY MOUTH ONCE DAILY 02/19/16   Jettie Booze, MD  Multiple Vitamin (MULTIVITAMIN WITH MINERALS) TABS Take 1 tablet by mouth daily.    Historical Provider, MD  PHENobarbital (LUMINAL) 97.2 MG tablet Take 97.2 mg by mouth at bedtime.    Historical Provider, MD  sertraline (ZOLOFT) 100 MG tablet Take 100 mg by mouth daily.    Historical Provider, MD  vitamin B-12 (CYANOCOBALAMIN) 1000 MCG tablet Take 1,000 mcg by mouth daily.    Historical Provider, MD    Family History Family History  Problem Relation Age of Onset  . Colon polyps Brother   . Heart attack Father   . Heart attack Brother     Social History Social History  Substance Use Topics  . Smoking status: Never Smoker  . Smokeless tobacco: Never Used  . Alcohol use No     Allergies   Review of patient's allergies  indicates no known allergies.   Review of Systems Review of Systems  Constitutional: Negative for fever.  HENT: Negative for rhinorrhea and sore throat.   Eyes: Negative for redness.  Respiratory: Negative for cough.   Cardiovascular: Negative for chest pain and leg swelling.  Gastrointestinal: Negative for abdominal pain, diarrhea, nausea and vomiting.  Genitourinary: Negative for dysuria.  Musculoskeletal: Negative for myalgias.  Skin: Negative for rash.  Neurological: Negative for seizures, speech difficulty, weakness and headaches.  Psychiatric/Behavioral: Negative for confusion.    Physical Exam Updated Vital Signs BP 118/99 (BP Location: Left Arm)   Pulse 75   Temp 97.9 F (36.6 C) (Oral)   Resp 20   Ht 5\' 6"  (1.676 m)   Wt 119 lb (54 kg)   SpO2 98%   BMI 19.21 kg/m   Physical Exam  Constitutional: He appears well-developed and well-nourished.  HENT:  Head: Normocephalic and atraumatic.  Eyes: Conjunctivae are normal. Right eye exhibits no discharge. Left eye exhibits no discharge.  Neck: Normal range of motion. Neck supple.  Cardiovascular: Normal rate, regular rhythm and normal heart sounds.   Pulmonary/Chest: Effort normal and breath sounds normal.  Abdominal: Soft. There is no tenderness.  Neurological: He is alert. He exhibits normal muscle tone. Coordination normal.  Skin: Skin is warm and dry.  Psychiatric: He has a normal mood and affect.  Nursing note and vitals reviewed.   ED Treatments / Results  DIAGNOSTIC STUDIES: Oxygen Saturation is 98% on RA, normal by my interpretation.    COORDINATION OF CARE: 6:12 PM Discussed treatment plan with pt at bedside which includes fluids and lab work and pt agreed to plan. Discussed repeating lab work to confirm previous findings. Also discussed possibility of low sodium being chronic condition.  Labs (all labs ordered are listed, but only abnormal results are displayed) Labs Reviewed  CBC - Abnormal; Notable  for the following:       Result Value   WBC 3.6 (*)    RBC 3.11 (*)    Hemoglobin 9.7 (*)    HCT 29.1 (*)    All other components within normal limits  BASIC METABOLIC PANEL - Abnormal; Notable for the following:    Sodium 126 (*)    Chloride 93 (*)    BUN 30 (*)    Creatinine, Ser 1.28 (*)    Calcium 8.7 (*)    GFR calc non Af Amer 49 (*)    GFR calc Af Amer 56 (*)    All other components within normal limits    EKG  EKG Interpretation None       Radiology No results found.  Procedures Procedures (including critical care time)  Medications Ordered in ED Medications  sodium chloride 0.9 % bolus 500 mL (not administered)     Initial Impression / Assessment and Plan / ED Course  I have reviewed  the triage vital signs and the nursing notes.  Pertinent labs & imaging results that were available during my care of the patient were reviewed by me and considered in my medical decision making (see chart for details).  Clinical Course  Comment By Time  Patient discussed with and seen by Dr. Gilford Raid. Pt is asymptomatic. Na=126, Hgb 9.7, both chronic. Patient and family are comfortable with d/c to home with close PCP follow-up. Return with worsening symptoms, confusion, nausea, seizures, other concerns. Patient verbalizes understanding and agrees with plan. States he is ready to go home.  Carlisle Cater, PA-C 07/28 1914   Vital signs reviewed and are as follows: Vitals:   04/18/16 1803  BP: 118/99  Pulse: 75  Resp: 20  Temp: 97.9 F (36.6 C)    Final Clinical Impressions(s) / ED Diagnoses   Final diagnoses:  Chronic hyponatremia  Chronic anemia    New Prescriptions New Prescriptions   No medications on file   Patient with Asymptomatic chronic hyponatremia. Treated with 500 mL IV normal saline in the ED. No complaints. Will discharge to home for PCP follow-up. Patient's sodium has been as low as 122 this year. Also chronic anemia, no active bleeding  suspected.  I personally performed the services described in this documentation, which was scribed in my presence. The recorded information has been reviewed and is accurate.    Carlisle Cater, PA-C 04/18/16 1922    Isla Pence, MD 04/18/16 [phone]

## 2016-04-18 NOTE — Discharge Instructions (Signed)
Please read and follow all provided instructions.  Your diagnoses today include:  1. Chronic hyponatremia   2. Chronic anemia     Tests performed today include: Sodium (126) and hemoglobin (9.7) Vital signs. See below for your results today.   Medications prescribed:  None  Take any prescribed medications only as directed.  Home care instructions:  Follow any educational materials contained in this packet.  BE VERY CAREFUL not to take multiple medicines containing Tylenol (also called acetaminophen). Doing so can lead to an overdose which can damage your liver and cause liver failure and possibly death.   Follow-up instructions: Please follow-up with your primary care provider in the next 3 days for further evaluation of your symptoms.   Return instructions:  Please return to the Emergency Department if you experience worsening symptoms.  Return with weakness, confusion, vomiting, headache, seizure.  Please return if you have any other emergent concerns.  Additional Information:  Your vital signs today were: BP 118/99 (BP Location: Left Arm)    Pulse 75    Temp 97.9 F (36.6 C) (Oral)    Resp 20    Ht 5\' 6"  (1.676 m)    Wt 54 kg    SpO2 98%    BMI 19.21 kg/m  If your blood pressure (BP) was elevated above 135/85 this visit, please have this repeated by your doctor within one month. --------------

## 2016-04-18 NOTE — ED Notes (Signed)
Pt here for fluids for chronic hyponatremia. A/o NAD on assessment and denies pain.

## 2016-04-21 ENCOUNTER — Encounter: Payer: Self-pay | Admitting: Cardiology

## 2016-04-23 ENCOUNTER — Ambulatory Visit (INDEPENDENT_AMBULATORY_CARE_PROVIDER_SITE_OTHER): Payer: Medicare Other | Admitting: *Deleted

## 2016-04-23 DIAGNOSIS — Z5181 Encounter for therapeutic drug level monitoring: Secondary | ICD-10-CM

## 2016-04-23 DIAGNOSIS — I4891 Unspecified atrial fibrillation: Secondary | ICD-10-CM

## 2016-04-23 DIAGNOSIS — Z7901 Long term (current) use of anticoagulants: Secondary | ICD-10-CM

## 2016-04-23 LAB — POCT INR: INR: 1.8

## 2016-04-29 DIAGNOSIS — E871 Hypo-osmolality and hyponatremia: Secondary | ICD-10-CM | POA: Diagnosis not present

## 2016-05-02 DIAGNOSIS — E871 Hypo-osmolality and hyponatremia: Secondary | ICD-10-CM | POA: Diagnosis not present

## 2016-05-07 ENCOUNTER — Ambulatory Visit (INDEPENDENT_AMBULATORY_CARE_PROVIDER_SITE_OTHER): Payer: Medicare Other | Admitting: Pharmacist

## 2016-05-07 DIAGNOSIS — I4891 Unspecified atrial fibrillation: Secondary | ICD-10-CM | POA: Diagnosis not present

## 2016-05-07 DIAGNOSIS — Z7901 Long term (current) use of anticoagulants: Secondary | ICD-10-CM

## 2016-05-07 DIAGNOSIS — Z5181 Encounter for therapeutic drug level monitoring: Secondary | ICD-10-CM | POA: Diagnosis not present

## 2016-05-07 LAB — POCT INR: INR: 2

## 2016-05-13 DIAGNOSIS — E871 Hypo-osmolality and hyponatremia: Secondary | ICD-10-CM | POA: Diagnosis not present

## 2016-05-21 DIAGNOSIS — I872 Venous insufficiency (chronic) (peripheral): Secondary | ICD-10-CM | POA: Diagnosis not present

## 2016-05-21 DIAGNOSIS — D485 Neoplasm of uncertain behavior of skin: Secondary | ICD-10-CM | POA: Diagnosis not present

## 2016-05-21 DIAGNOSIS — Z85828 Personal history of other malignant neoplasm of skin: Secondary | ICD-10-CM | POA: Diagnosis not present

## 2016-05-21 DIAGNOSIS — L821 Other seborrheic keratosis: Secondary | ICD-10-CM | POA: Diagnosis not present

## 2016-05-21 DIAGNOSIS — D225 Melanocytic nevi of trunk: Secondary | ICD-10-CM | POA: Diagnosis not present

## 2016-05-21 DIAGNOSIS — L570 Actinic keratosis: Secondary | ICD-10-CM | POA: Diagnosis not present

## 2016-05-22 ENCOUNTER — Ambulatory Visit (INDEPENDENT_AMBULATORY_CARE_PROVIDER_SITE_OTHER): Payer: Medicare Other | Admitting: *Deleted

## 2016-05-22 DIAGNOSIS — Z5181 Encounter for therapeutic drug level monitoring: Secondary | ICD-10-CM | POA: Diagnosis not present

## 2016-05-22 DIAGNOSIS — I4891 Unspecified atrial fibrillation: Secondary | ICD-10-CM | POA: Diagnosis not present

## 2016-05-22 DIAGNOSIS — Z7901 Long term (current) use of anticoagulants: Secondary | ICD-10-CM

## 2016-05-22 LAB — POCT INR: INR: 2.1

## 2016-06-05 DIAGNOSIS — E871 Hypo-osmolality and hyponatremia: Secondary | ICD-10-CM | POA: Diagnosis not present

## 2016-06-12 ENCOUNTER — Ambulatory Visit (INDEPENDENT_AMBULATORY_CARE_PROVIDER_SITE_OTHER): Payer: Medicare Other | Admitting: Pharmacist

## 2016-06-12 DIAGNOSIS — Z7901 Long term (current) use of anticoagulants: Secondary | ICD-10-CM | POA: Diagnosis not present

## 2016-06-12 DIAGNOSIS — Z5181 Encounter for therapeutic drug level monitoring: Secondary | ICD-10-CM

## 2016-06-12 DIAGNOSIS — I4891 Unspecified atrial fibrillation: Secondary | ICD-10-CM | POA: Diagnosis not present

## 2016-06-12 LAB — POCT INR: INR: 2.6

## 2016-06-13 ENCOUNTER — Encounter: Payer: Self-pay | Admitting: Nurse Practitioner

## 2016-06-22 ENCOUNTER — Emergency Department (HOSPITAL_COMMUNITY)
Admission: EM | Admit: 2016-06-22 | Discharge: 2016-06-23 | Disposition: A | Discharge status: Home / Self Care | Payer: Medicare Other | Attending: Emergency Medicine | Admitting: Emergency Medicine

## 2016-06-22 ENCOUNTER — Emergency Department (HOSPITAL_COMMUNITY): Payer: Medicare Other

## 2016-06-22 ENCOUNTER — Encounter (HOSPITAL_COMMUNITY): Payer: Self-pay | Admitting: Emergency Medicine

## 2016-06-22 DIAGNOSIS — Y939 Activity, unspecified: Secondary | ICD-10-CM | POA: Diagnosis not present

## 2016-06-22 DIAGNOSIS — Z950 Presence of cardiac pacemaker: Secondary | ICD-10-CM | POA: Insufficient documentation

## 2016-06-22 DIAGNOSIS — Y999 Unspecified external cause status: Secondary | ICD-10-CM | POA: Insufficient documentation

## 2016-06-22 DIAGNOSIS — S199XXA Unspecified injury of neck, initial encounter: Secondary | ICD-10-CM | POA: Diagnosis not present

## 2016-06-22 DIAGNOSIS — I2510 Atherosclerotic heart disease of native coronary artery without angina pectoris: Secondary | ICD-10-CM | POA: Diagnosis not present

## 2016-06-22 DIAGNOSIS — I10 Essential (primary) hypertension: Secondary | ICD-10-CM | POA: Insufficient documentation

## 2016-06-22 DIAGNOSIS — Z8546 Personal history of malignant neoplasm of prostate: Secondary | ICD-10-CM | POA: Insufficient documentation

## 2016-06-22 DIAGNOSIS — S0101XA Laceration without foreign body of scalp, initial encounter: Secondary | ICD-10-CM | POA: Insufficient documentation

## 2016-06-22 DIAGNOSIS — Y92009 Unspecified place in unspecified non-institutional (private) residence as the place of occurrence of the external cause: Secondary | ICD-10-CM | POA: Insufficient documentation

## 2016-06-22 DIAGNOSIS — S7002XA Contusion of left hip, initial encounter: Secondary | ICD-10-CM | POA: Insufficient documentation

## 2016-06-22 DIAGNOSIS — S0990XA Unspecified injury of head, initial encounter: Secondary | ICD-10-CM | POA: Diagnosis not present

## 2016-06-22 DIAGNOSIS — S79912A Unspecified injury of left hip, initial encounter: Secondary | ICD-10-CM | POA: Diagnosis not present

## 2016-06-22 DIAGNOSIS — S0003XA Contusion of scalp, initial encounter: Secondary | ICD-10-CM | POA: Diagnosis not present

## 2016-06-22 DIAGNOSIS — Z79899 Other long term (current) drug therapy: Secondary | ICD-10-CM | POA: Diagnosis not present

## 2016-06-22 DIAGNOSIS — W1839XA Other fall on same level, initial encounter: Secondary | ICD-10-CM | POA: Diagnosis not present

## 2016-06-22 DIAGNOSIS — M25552 Pain in left hip: Secondary | ICD-10-CM | POA: Diagnosis not present

## 2016-06-22 DIAGNOSIS — G4489 Other headache syndrome: Secondary | ICD-10-CM | POA: Diagnosis not present

## 2016-06-22 DIAGNOSIS — W19XXXA Unspecified fall, initial encounter: Secondary | ICD-10-CM

## 2016-06-22 LAB — CBC WITH DIFFERENTIAL/PLATELET
Basophils Absolute: 0 10*3/uL (ref 0.0–0.1)
Basophils Relative: 1 %
Eosinophils Absolute: 0.1 10*3/uL (ref 0.0–0.7)
Eosinophils Relative: 2 %
HCT: 30 % — ABNORMAL LOW (ref 39.0–52.0)
HEMOGLOBIN: 9.5 g/dL — AB (ref 13.0–17.0)
LYMPHS ABS: 0.7 10*3/uL (ref 0.7–4.0)
LYMPHS PCT: 15 %
MCH: 27.7 pg (ref 26.0–34.0)
MCHC: 31.7 g/dL (ref 30.0–36.0)
MCV: 87.5 fL (ref 78.0–100.0)
MONOS PCT: 10 %
Monocytes Absolute: 0.4 10*3/uL (ref 0.1–1.0)
NEUTROS PCT: 72 %
Neutro Abs: 3.1 10*3/uL (ref 1.7–7.7)
Platelets: 121 10*3/uL — ABNORMAL LOW (ref 150–400)
RBC: 3.43 MIL/uL — AB (ref 4.22–5.81)
RDW: 14 % (ref 11.5–15.5)
WBC: 4.3 10*3/uL (ref 4.0–10.5)

## 2016-06-22 LAB — BASIC METABOLIC PANEL
Anion gap: 9 (ref 5–15)
BUN: 28 mg/dL — AB (ref 6–20)
CALCIUM: 8.8 mg/dL — AB (ref 8.9–10.3)
CHLORIDE: 98 mmol/L — AB (ref 101–111)
CO2: 23 mmol/L (ref 22–32)
CREATININE: 1.21 mg/dL (ref 0.61–1.24)
GFR calc non Af Amer: 52 mL/min — ABNORMAL LOW (ref >60)
Glucose, Bld: 109 mg/dL — ABNORMAL HIGH (ref 65–99)
Potassium: 4.5 mmol/L (ref 3.5–5.1)
SODIUM: 130 mmol/L — AB (ref 135–145)

## 2016-06-22 NOTE — ED Notes (Signed)
Patient taken to X-RAY. Will contact CT to get scans while at radiology

## 2016-06-22 NOTE — ED Provider Notes (Signed)
[facility] DEPT Provider Note   CSN: [id number] Arrival date & time: 06/22/16  2145    History   Chief Complaint Chief Complaint  Patient presents with  . Fall  . Head Injury    HPI Eddie Park is a 82 y.o. male.  82 year old male with history of coronary artery disease s/p MI in 1995, PAF, hypertension, and mitral valve regurgitation presents to the emergency department today after a witnessed fall at home. Patient states that he often gets off balance when he turns too quickly. He states that he went to turn tonight and lost his footing in the doorway causing him to fall backwards. Wife states that he hit his head and then fell on his bottom. He was able to ambulate after the fall. He had no witnessed loss of consciousness. Patient denies any lightheadedness, dizziness, chest pain, or shortness of breath prior to his fall this evening. He has no nausea or vomiting currently. He does not complain of a headache.      Past Medical History:  Diagnosis Date  . Anxiety   . Coronary artery disease    s/p MI 1995  . Hypertension   . Mitral valve regurgitation   . Persistent atrial fibrillation ([place])   . Prostate cancer ([place]) 2002  . Seizure disorder ([place])   . Tachycardia-bradycardia syndrome ([place])    with syncope, s/p PPM by Dr Leonia Reeves    Patient Active Problem List   Diagnosis Date Noted  . Coronary atherosclerosis of native coronary artery 01/25/2014  . Mitral valve disorder 01/25/2014  . Aortic valve disorders 01/25/2014  . Encounter for therapeutic drug monitoring 10/19/2013  . Long term current use of anticoagulant therapy 07/21/2013  . [employer] 06/10/2012  . Atrial fibrillation ([place]) 06/08/2012  . Tachycardia-bradycardia syndrome ([place]) 06/08/2012  . Hypertension 06/08/2012    Past Surgical History:  Procedure Laterality Date  . artificial urinary sphincter    . PACEMAKER INSERTION  04/29/06   Generator change (BSc) by Dr Rayann Heman 06/21/12    . PERMANENT PACEMAKER GENERATOR CHANGE N/A 06/08/2012   Procedure: PERMANENT PACEMAKER GENERATOR CHANGE;  Surgeon: Thompson Grayer, MD;  Location: [facility] CATH LAB;  Service: Cardiovascular;  Laterality: N/A;  . PROSTATE SURGERY         Home Medications    Prior to Admission medications   Medication Sig Start Date End Date Taking? Authorizing Provider  cholecalciferol (VITAMIN D) 1000 UNITS tablet Take 2,000 Units by mouth daily.    Historical Provider, MD  fish oil-omega-3 fatty acids 1000 MG capsule Take 2 g by mouth daily.    Historical Provider, MD  furosemide (LASIX) 20 MG tablet Take 1 tablet (20 mg total) by mouth daily. 12/19/15   Jettie Booze, MD  JANTOVEN 5 MG tablet TAKE AS DIRECTED BY COUMADIN CLINIC 02/26/16   Jettie Booze, MD  lisinopril (PRINIVIL,ZESTRIL) 20 MG tablet TAKE 1 TABLET BY MOUTH ONCE A DAY 03/27/16   Jettie Booze, MD  metoprolol succinate (TOPROL-XL) 50 MG 24 hr tablet TAKE 1/2 TABLET BY MOUTH ONCE DAILY 02/19/16   Jettie Booze, MD  Multiple Vitamin (MULTIVITAMIN WITH MINERALS) TABS Take 1 tablet by mouth daily.    Historical Provider, MD  PHENobarbital (LUMINAL) 97.2 MG tablet Take 97.2 mg by mouth at bedtime.    Historical Provider, MD  sertraline (ZOLOFT) 100 MG tablet Take 100 mg by mouth daily.    Historical Provider, MD  vitamin B-12 (CYANOCOBALAMIN) 1000 MCG tablet Take 1,000 mcg by  mouth daily.    Historical Provider, MD    Family History Family History  Problem Relation Age of Onset  . Colon polyps Brother   . Heart attack Father   . Heart attack Brother     Social History Social History  Substance Use Topics  . Smoking status: Never Smoker  . Smokeless tobacco: Never Used  . Alcohol use No     Allergies   Review of patient's allergies indicates no known allergies.   Review of Systems Review of Systems Ten systems reviewed and are negative for acute change, except as noted in the HPI.     Physical Exam Updated  Vital Signs BP 128/57   Pulse 62   Temp 97.4 F (36.3 C) (Oral)   Resp 12   Ht 5\' 6"  (1.676 m)   Wt 56.2 kg   SpO2 99%   BMI 20.01 kg/m   Physical Exam  Constitutional: He is oriented to person, place, and time. He appears well-developed and well-nourished. No distress.  Nontoxic-appearing and pleasant.  HENT:  Head: Normocephalic.  Mouth/Throat: Oropharynx is clear and moist.  Contusion to posterior parietal scalp with 1cm overlying laceration; bleeding controlled. No appreciable skull instability. No battle's sign or raccoon's eyes.  Eyes: Conjunctivae and EOM are normal. Pupils are equal, round, and reactive to light. No scleral icterus.  Neck: Normal range of motion.  Normal ROM exhibited  Cardiovascular: Normal rate, regular rhythm and intact distal pulses.   Pulmonary/Chest: Effort normal. No respiratory distress. He has no wheezes.  Respirations even and unlabored.  Musculoskeletal: Normal range of motion. He exhibits tenderness.  Thinners to palpation to the left lateral hip without bony deformity or crepitus. No appreciable leg shortening or malrotation. No tenderness to palpation to the thoracic or lumbosacral midline. No bony deformities, step-offs, or crepitus.  Neurological: He is alert and oriented to person, place, and time. No cranial nerve deficit.  GCS 15. Speech is goal oriented. Patient answers questions appropriately and follows commands. No gross focal deficits appreciated.  Skin: Skin is warm and dry. No rash noted. He is not diaphoretic. No erythema. No pallor.  Psychiatric: He has a normal mood and affect. His behavior is normal.  Nursing note and vitals reviewed.    ED Treatments / Results  Labs (all labs ordered are listed, but only abnormal results are displayed) Labs Reviewed  CBC WITH DIFFERENTIAL/PLATELET - Abnormal; Notable for the following:       Result Value   RBC 3.43 (*)    Hemoglobin 9.5 (*)    HCT 30.0 (*)    Platelets 121 (*)    All  other components within normal limits  BASIC METABOLIC PANEL - Abnormal; Notable for the following:    Sodium 130 (*)    Chloride 98 (*)    Glucose, Bld 109 (*)    BUN 28 (*)    Calcium 8.8 (*)    GFR calc non Af Amer 52 (*)    All other components within normal limits    EKG  EKG Interpretation  Date/Time:  Monday June 23 2016 00:10:34 EDT Ventricular Rate:  60 PR Interval:    QRS Duration: 218 QT Interval:  511 QTC Calculation: 511 R Axis:   -77 Text Interpretation:  Atrial fibrillation Ventricular bigeminy IVCD, consider atypical RBBB LVH with secondary repolarization abnormality Confirmed by Sherry Ruffing MD, CHRISTOPHER [phone]) on 06/23/2016 12:14:09 AM       Radiology Ct Head Wo Contrast  Result Date: 06/23/2016 CLINICAL  DATA:  82 y/o  M; status post fall. EXAM: CT HEAD WITHOUT CONTRAST CT CERVICAL SPINE WITHOUT CONTRAST TECHNIQUE: Multidetector CT imaging of the head and cervical spine was performed following the standard protocol without intravenous contrast. Multiplanar CT image reconstructions of the cervical spine were also generated. COMPARISON:  09/28/2015 CT of the head and cervical spine. FINDINGS: CT HEAD FINDINGS Brain: Chronic infarct within the right cerebellar hemisphere. Background of mild parenchymal volume loss and chronic microvascular ischemic changes. No evidence of large acute infarct, hemorrhage, hydrocephalus, extra-axial collection, or mass lesion. Vascular: Extensive calcific atherosclerosis of the carotid siphons. No hyperdense vessel. Skull: Left frontal scalp 5 x 25 mm lipoma. High posterior midline parietal scalp hematoma and laceration. Sinuses/Orbits: Mild mucosal thickening of the anterior ethmoid air cells. Underpneumatized frontal sinuses. Otherwise the visualized paranasal sinuses and mastoid air cells are normally aerated. Bilateral intra-ocular lens replacement. Other: None. CT CERVICAL SPINE FINDINGS Alignment: Normal. Skull base and vertebrae: No  acute fracture. No primary bone lesion or focal pathologic process. Soft tissues and spinal canal: No prevertebral fluid or swelling. No visible canal hematoma. Disc levels: Multilevel cervical spondylosis greatest from C5 through C7 with there is disc space narrowing and endplate degenerative changes. There is multilevel uncovertebral and facet hypertrophy with mild to moderate foraminal narrowing. No high-grade bony canal stenosis or foraminal narrowing is identified. Upper chest: Clear appear Other: Moderate calcific atherosclerosis of carotid bifurcations bilaterally. Normal thyroid gland. No definite cervical adenopathy or mass is identified on this noncontrast examination. IMPRESSION: 1. No acute intracranial abnormality is identified. Stable chronic microvascular ischemic changes and chronic right cerebellar infarct. 2. High posterior midline parietal scalp hematoma and laceration. No calvarial fracture. 3. No acute fracture or malalignment of the cervical spine. Electronically Signed   By: Kristine Garbe M.D.   On: 06/23/2016 00:54   Ct Cervical Spine Wo Contrast  Result Date: 06/23/2016 CLINICAL DATA:  82 y/o  M; status post fall. EXAM: CT HEAD WITHOUT CONTRAST CT CERVICAL SPINE WITHOUT CONTRAST TECHNIQUE: Multidetector CT imaging of the head and cervical spine was performed following the standard protocol without intravenous contrast. Multiplanar CT image reconstructions of the cervical spine were also generated. COMPARISON:  09/28/2015 CT of the head and cervical spine. FINDINGS: CT HEAD FINDINGS Brain: Chronic infarct within the right cerebellar hemisphere. Background of mild parenchymal volume loss and chronic microvascular ischemic changes. No evidence of large acute infarct, hemorrhage, hydrocephalus, extra-axial collection, or mass lesion. Vascular: Extensive calcific atherosclerosis of the carotid siphons. No hyperdense vessel. Skull: Left frontal scalp 5 x 25 mm lipoma. High posterior  midline parietal scalp hematoma and laceration. Sinuses/Orbits: Mild mucosal thickening of the anterior ethmoid air cells. Underpneumatized frontal sinuses. Otherwise the visualized paranasal sinuses and mastoid air cells are normally aerated. Bilateral intra-ocular lens replacement. Other: None. CT CERVICAL SPINE FINDINGS Alignment: Normal. Skull base and vertebrae: No acute fracture. No primary bone lesion or focal pathologic process. Soft tissues and spinal canal: No prevertebral fluid or swelling. No visible canal hematoma. Disc levels: Multilevel cervical spondylosis greatest from C5 through C7 with there is disc space narrowing and endplate degenerative changes. There is multilevel uncovertebral and facet hypertrophy with mild to moderate foraminal narrowing. No high-grade bony canal stenosis or foraminal narrowing is identified. Upper chest: Clear appear Other: Moderate calcific atherosclerosis of carotid bifurcations bilaterally. Normal thyroid gland. No definite cervical adenopathy or mass is identified on this noncontrast examination. IMPRESSION: 1. No acute intracranial abnormality is identified. Stable chronic microvascular ischemic changes and  chronic right cerebellar infarct. 2. High posterior midline parietal scalp hematoma and laceration. No calvarial fracture. 3. No acute fracture or malalignment of the cervical spine. Electronically Signed   By: Kristine Garbe M.D.   On: 06/23/2016 00:54   Dg Hip Unilat W Or Wo Pelvis 2-3 Views Left  Result Date: 06/23/2016 CLINICAL DATA:  82 y/o M; status post fall with left posterior hip joint pain. EXAM: DG HIP (WITH OR WITHOUT PELVIS) 2-3V LEFT COMPARISON:  None. FINDINGS: Small bony density at the superior lateral margin of the acetabulum. The left femur appears intact. Pubic symphysis and sacroiliac joints are maintained. Surgical clips project over the pelvis bilaterally. IMPRESSION: Bony density projects adjacent to the superolateral margin of  acetabulum and may represent an intra-articular body, however fracture fragment is possible. Consider pelvic CT for further characterization. Electronically Signed   By: Kristine Garbe M.D.   On: 06/23/2016 00:28    Procedures Procedures (including critical care time)  Medications Ordered in ED Medications - No data to display   Initial Impression / Assessment and Plan / ED Course  I have reviewed the triage vital signs and the nursing notes.  Pertinent labs & imaging results that were available during my care of the patient were reviewed by me and considered in my medical decision making (see chart for details).  Clinical Course  Value Comment By Time  CT Head Wo Contrast (Reviewed) Antonietta Breach, PA-C 10/02 0047    12:46 AM Hip x-ray results discussed with the family. I have discussed with the patient and his wife about my inability to rule out a small fracture fragment. Patient was noted to be ambulatory after his fall without difficulty. Plan to ensure ambulation while in the emergency department. If patient is able to ambulate without difficulty, I do not believe that this bony density represents a more emergent process. I do not believe further evaluation with CT scan would change the management of this patient's care. Patient and wife are agreeable to withholding further imaging of the hip at this time. If the patient has difficulty with ambulation while in the emergency department, will discuss further options. CT head and cervical spine pending.  1:10 AM CT head without any skull fracture or intracranial abnormality. Plan to ambulate.  LACERATION REPAIR Performed by: Antonietta Breach Authorized by: Antonietta Breach Consent: Verbal consent obtained. Risks and benefits: risks, benefits and alternatives were discussed Consent given by: patient Patient identity confirmed: provided demographic data Prepped and Draped in normal sterile fashion Wound explored  Laceration  Location: posterior scalp  Laceration Length: 1cm  No Foreign Bodies seen or palpated  Anesthesia: none  Local anesthetic: none  Anesthetic total: n/a  Irrigation method: syringe Amount of cleaning: standard  Skin closure: staples  Number of staples: 1  Technique: simple  Patient tolerance: Patient tolerated the procedure well with no immediate complications.  1:20AM Patient ambulated in the hall well with 1-2 person assist, though rarely requiring 2 providers to steady himself. Wife states that he usually ambulates with a walker when at home. I believe the patient is stable and appropriate for discharge at the present time. Return precautions discussed and provided. Patient discharged in satisfactory condition. Patient and wife with no unaddressed concerns.   Final Clinical Impressions(s) / ED Diagnoses   Final diagnoses:  Laceration of scalp, initial encounter  Contusion of left hip, initial encounter  Fall in home, initial encounter    New Prescriptions Discharge Medication List as of 06/23/2016  1:14  AM       Antonietta Breach, PA-C 06/23/16 6701    Gwenyth Allegra Tegeler, MD 06/23/16 4103

## 2016-06-22 NOTE — ED Triage Notes (Signed)
Per EMS, pt was at home with wife who witnessed pt lose balance and fall against doorway, striking head. Pt is taking coumadin. Per EMS, pt denies LOC. A & O x3 on scene, per EMS.

## 2016-06-23 DIAGNOSIS — S0101XA Laceration without foreign body of scalp, initial encounter: Secondary | ICD-10-CM | POA: Diagnosis not present

## 2016-06-23 NOTE — Discharge Instructions (Signed)
Have your stable removed by your primary doctor in 1 week. You may take Tylenol as needed for pain. Be sure to walk with a walker to assist with your balance. Have your INR rechecked by your primary doctor to ensure your levels are appropriate for management of your atrial fibrillation. Return for new or concerning symptoms.

## 2016-07-01 DIAGNOSIS — D649 Anemia, unspecified: Secondary | ICD-10-CM | POA: Diagnosis not present

## 2016-07-01 DIAGNOSIS — Z7901 Long term (current) use of anticoagulants: Secondary | ICD-10-CM | POA: Diagnosis not present

## 2016-07-01 DIAGNOSIS — Z4802 Encounter for removal of sutures: Secondary | ICD-10-CM | POA: Diagnosis not present

## 2016-07-01 DIAGNOSIS — Z23 Encounter for immunization: Secondary | ICD-10-CM | POA: Diagnosis not present

## 2016-07-01 NOTE — Progress Notes (Signed)
Electrophysiology Office Note Date: 07/02/2016  ID:  Eddie Park, DOB Aug 25, 1927, MRN [id number]  PCP: Eddie Heck, MD Primary Cardiologist: Eddie Park Electrophysiologist: Eddie Park  CC: Pacemaker follow-up  Eddie Park is a 82 y.o. male seen today for Eddie Park.  He presents today for routine electrophysiology followup.  Since last being seen in our clinic, the patient reports doing relatively well.  He has chronic LE edema.  He denies chest pain, palpitations, dyspnea, PND, orthopnea, nausea, vomiting, dizziness, syncope, weight gain, or early satiety.  Device History: BSX dual chamber PPM implanted 2013 for tachy/brady syndrome   Past Medical History:  Diagnosis Date  . Anxiety   . Coronary artery disease    s/p MI 1995  . Hypertension   . Mitral valve regurgitation   . Persistent atrial fibrillation (Eddie Park)   . Prostate cancer ([place]) 2002  . Seizure disorder ([place])   . Tachycardia-bradycardia syndrome ([place])    with syncope, s/p PPM by Eddie Park   Past Surgical History:  Procedure Laterality Date  . artificial urinary sphincter    . PACEMAKER INSERTION  04/29/06   Generator change (BSc) by Eddie Park 06/21/12  . PERMANENT PACEMAKER GENERATOR CHANGE N/A 06/08/2012   Procedure: PERMANENT PACEMAKER GENERATOR CHANGE;  Surgeon: Eddie Grayer, MD;  Location: [facility] CATH LAB;  Service: Cardiovascular;  Laterality: N/A;  . PROSTATE SURGERY      Current Outpatient Prescriptions  Medication Sig Dispense Refill  . cholecalciferol (VITAMIN D) 1000 UNITS tablet Take 2,000 Units by mouth daily.    . fish oil-omega-3 fatty acids 1000 MG capsule Take 2 g by mouth daily.    . furosemide (LASIX) 20 MG tablet Take 1 tablet (20 mg total) by mouth daily.    [name] lisinopril (PRINIVIL,ZESTRIL) 20 MG tablet TAKE 1 TABLET BY MOUTH ONCE A DAY 90 tablet 2  . metoprolol succinate (TOPROL-XL) 50 MG 24 hr tablet TAKE 1/2 TABLET BY MOUTH ONCE DAILY 45 tablet 2  . Multiple Vitamin  (MULTIVITAMIN WITH MINERALS) TABS Take 1 tablet by mouth daily.    [name] PHENobarbital (LUMINAL) 97.2 MG tablet Take 97.2 mg by mouth at bedtime.    . sertraline (ZOLOFT) 100 MG tablet Take 100 mg by mouth daily.    . vitamin B-12 (CYANOCOBALAMIN) 1000 MCG tablet Take 1,000 mcg by mouth daily.    [name] warfarin (COUMADIN) 5 MG tablet Take 7.5-10 mg by mouth daily. Monday, Wednesday and Friday take 10mg . All other days take 7.5mg      No current facility-administered medications for this visit.     Allergies:   Review of patient's allergies indicates no known allergies.   Social History: Social History   Social History  . Marital status: Married    Spouse name: N/A  . Number of children: N/A  . Years of education: N/A   Occupational History  . Not on file.   Social History Main Topics  . Smoking status: Never Smoker  . Smokeless tobacco: Never Used  . Alcohol use No  . Drug use: No  . Sexual activity: Not on file   Other Topics Concern  . Not on file   Social History Narrative   Lives in [place]   Retired Furniture conservator/restorer    Family History: Family History  Problem Relation Age of Onset  . Colon polyps Brother   . Heart attack Father   . Heart attack Brother     Review of Systems: All other systems reviewed and are otherwise negative except as  noted above.   Physical Exam: VS:  BP (!) 110/40   Pulse 72   Ht 5\' 6"  (1.676 m)   Wt 131 lb 3.2 oz (59.5 kg)   BMI 21.18 kg/m  , BMI Body mass index is 21.18 kg/m.  GEN- The patient is elderly and chronically ill appearing, alert and oriented x 3 today.   HEENT: normocephalic, atraumatic; sclera clear, conjunctiva pink; hearing intact; oropharynx clear; neck supple Lungs- Clear to ausculation bilaterally, normal work of breathing.  No wheezes, rales, rhonchi Heart- Regular rate and rhythm (paced) GI- soft, non-tender, non-distended, bowel sounds present Extremities- no clubbing, cyanosis, 1+ BLE edema MS- no significant  deformity or atrophy Skin- warm and dry, no rash or lesion; PPM pocket well healed Psych- euthymic mood, full affect Neuro- strength and sensation are intact  PPM Interrogation- reviewed in detail today,  See PACEART report  EKG:  EKG is not ordered today.  Recent Labs: 09/07/2015: Brain Natriuretic Peptide 2,098.3 06/22/2016: BUN 28; Creatinine, Ser 1.21; Hemoglobin 9.5; Platelets 121; Potassium 4.5; Sodium 130   Wt Readings from Last 3 Encounters:  07/02/16 131 lb 3.2 oz (59.5 kg)  06/22/16 124 lb (56.2 kg)  04/18/16 119 lb (54 kg)     Other studies Reviewed: Additional studies/ records that were reviewed today include: Eddie Park and Eddie Park office notes  Assessment and Plan:  1.  Tachy/brady syndrome Normal PPM function See Pace Art report No changes today  2.  Permanent atrial fibrillation V rates controlled by device interrogation today Continue Warfarin for CHADS2VASC of 4 Recent CBC stable   3.  Chronic diastolic heart failure/LE edema With some increased LE edema today He is compliant with compression hose Advised to take 40mg  of Lasix daily for 2 days then return to 20mg  daily  4.  Severe MR Not a candidate for repair per Eddie Park last note   Current medicines are reviewed at length with the patient today.   The patient does not have concerns regarding his medicines.  The following changes were made today:  Take extra 20mg  of Lasix for 2 days  Labs/ tests ordered today include: none No orders of the defined types were placed in this encounter.    Disposition:   Follow up with [facility], Eddie Park in 1 year, Eddie Park 6 months    Signed, Eddie Marshall, NP 07/02/2016 9:55 AM  [facility] [address] [place] [address] [phone] (office) [phone] (fax

## 2016-07-02 ENCOUNTER — Ambulatory Visit (INDEPENDENT_AMBULATORY_CARE_PROVIDER_SITE_OTHER): Payer: Medicare Other | Admitting: Nurse Practitioner

## 2016-07-02 ENCOUNTER — Encounter: Payer: Self-pay | Admitting: Internal Medicine

## 2016-07-02 ENCOUNTER — Encounter: Payer: Self-pay | Admitting: Nurse Practitioner

## 2016-07-02 VITALS — BP 110/40 | HR 72 | Ht 66.0 in | Wt 131.2 lb

## 2016-07-02 DIAGNOSIS — I482 Chronic atrial fibrillation: Secondary | ICD-10-CM

## 2016-07-02 DIAGNOSIS — I495 Sick sinus syndrome: Secondary | ICD-10-CM

## 2016-07-02 DIAGNOSIS — I340 Nonrheumatic mitral (valve) insufficiency: Secondary | ICD-10-CM | POA: Diagnosis not present

## 2016-07-02 DIAGNOSIS — I5032 Chronic diastolic (congestive) heart failure: Secondary | ICD-10-CM | POA: Diagnosis not present

## 2016-07-02 DIAGNOSIS — I4821 Permanent atrial fibrillation: Secondary | ICD-10-CM

## 2016-07-02 LAB — CUP PACEART INCLINIC DEVICE CHECK
Date Time Interrogation Session: 20171011095953
Implantable Lead Implant Date: 20130917
Implantable Lead Location: 753860
Implantable Lead Model: 4469
Implantable Lead Serial Number: 448722
MDC IDC LEAD IMPLANT DT: 20130917
MDC IDC LEAD LOCATION: 753859
MDC IDC LEAD SERIAL: 484382
MDC IDC PG SERIAL: 111635

## 2016-07-02 NOTE — Patient Instructions (Addendum)
Medication Instructions:   FOR TWO DAYS ONLY  TAKE 40 MG OF LASIX   THE RESUME BACK TO LASIX 20 MG ONCE A DAY   If you need a refill on your cardiac medications before your next appointment, please call your pharmacy.  Labwork: NONE ORDER TODAY    Testing/Procedures: NONE ORDER TODAY    Follow-Up: .Your physician wants you to follow-up in: [place] will receive a reminder letter in the mail two months in advance. If you don't receive a letter, please call our office to schedule the follow-up appointment.  Remote monitoring is used to monitor your Pacemaker of ICD from home. This monitoring reduces the number of office visits required to check your device to one time per year. It allows [place] to keep an eye on the functioning of your device to ensure it is working properly. You are scheduled for a device check from home on .  10/02/2016..You may send your transmission at any time that day. If you have a wireless device, the transmission will be sent automatically. After your physician reviews your transmission, you will receive a postcard with your next transmission date.     Any Other Special Instructions Will Be Listed Below (If Applicable).

## 2016-07-09 DIAGNOSIS — D649 Anemia, unspecified: Secondary | ICD-10-CM | POA: Diagnosis not present

## 2016-07-17 ENCOUNTER — Ambulatory Visit (INDEPENDENT_AMBULATORY_CARE_PROVIDER_SITE_OTHER): Payer: Medicare Other | Admitting: *Deleted

## 2016-07-17 ENCOUNTER — Encounter: Payer: Self-pay | Admitting: Physician Assistant

## 2016-07-17 DIAGNOSIS — I4891 Unspecified atrial fibrillation: Secondary | ICD-10-CM

## 2016-07-17 DIAGNOSIS — Z7901 Long term (current) use of anticoagulants: Secondary | ICD-10-CM | POA: Diagnosis not present

## 2016-07-17 DIAGNOSIS — Z5181 Encounter for therapeutic drug level monitoring: Secondary | ICD-10-CM | POA: Diagnosis not present

## 2016-07-17 LAB — POCT INR: INR: 3.2

## 2016-07-18 ENCOUNTER — Ambulatory Visit (INDEPENDENT_AMBULATORY_CARE_PROVIDER_SITE_OTHER): Payer: Medicare Other | Admitting: Physician Assistant

## 2016-07-18 ENCOUNTER — Encounter: Payer: Self-pay | Admitting: Physician Assistant

## 2016-07-18 VITALS — BP 122/54 | HR 60 | Resp 16 | Ht 66.0 in | Wt 140.5 lb

## 2016-07-18 DIAGNOSIS — N183 Chronic kidney disease, stage 3 unspecified: Secondary | ICD-10-CM

## 2016-07-18 DIAGNOSIS — I351 Nonrheumatic aortic (valve) insufficiency: Secondary | ICD-10-CM | POA: Diagnosis not present

## 2016-07-18 DIAGNOSIS — E871 Hypo-osmolality and hyponatremia: Secondary | ICD-10-CM

## 2016-07-18 DIAGNOSIS — I340 Nonrheumatic mitral (valve) insufficiency: Secondary | ICD-10-CM

## 2016-07-18 DIAGNOSIS — I5043 Acute on chronic combined systolic (congestive) and diastolic (congestive) heart failure: Secondary | ICD-10-CM

## 2016-07-18 DIAGNOSIS — I10 Essential (primary) hypertension: Secondary | ICD-10-CM

## 2016-07-18 LAB — CBC
HEMATOCRIT: 28.7 % — AB (ref 38.5–50.0)
HEMOGLOBIN: 9.5 g/dL — AB (ref 13.2–17.1)
MCH: 28.7 pg (ref 27.0–33.0)
MCHC: 33.1 g/dL (ref 32.0–36.0)
MCV: 86.7 fL (ref 80.0–100.0)
MPV: 8.9 fL (ref 7.5–12.5)
Platelets: 131 10*3/uL — ABNORMAL LOW (ref 140–400)
RBC: 3.31 MIL/uL — ABNORMAL LOW (ref 4.20–5.80)
RDW: 16.2 % — AB (ref 11.0–15.0)
WBC: 4.7 10*3/uL (ref 3.8–10.8)

## 2016-07-18 MED ORDER — TORSEMIDE 20 MG PO TABS: 20.0000 mg | ORAL_TABLET | Freq: Every day | ORAL | 6 refills | Status: DC

## 2016-07-18 NOTE — Patient Instructions (Addendum)
Medication Instructions:   STOP TAKING  LISINOPRIL  10 MG   STOP  TAKING LASIX 20 MG   START TAKING TORSEMIDE 20 MG ONCE A DAY     If you need a refill on your cardiac medications before your next appointment, please call your pharmacy.  Labwork: CMET CBC TSH  AND  BNP   Testing/Procedures: NONE ORDER TODAY    Follow-Up:  Tuesday OR Wednesday NEXT WEEK  WITH [facility] OR AN AVAILABLE EXTENDER   Any Other Special Instructions Will Be Listed Below (If Applicable).

## 2016-07-18 NOTE — Progress Notes (Signed)
Cardiology Office Note    Date:  07/18/2016  ID:  Eddie Park, DOB 1926/11/02, MRN [id number] PCP:  Gerrit Heck, MD  Cardiologist: Irish Lack  Chief Complaint: fluid overload  History of Present Illness:  Eddie Park is a 82 y.o. male with history of CAD s/p MI 1995 (further details unknown), HTN, severe mitral valve regurgitation, mod-severe aortic regurgitaiton, permanent atrial fib on Coumadin, permanent atrial fib with tachy/brady syndrome (initial implant 2007, last gen change 2013 with BSX dual chamber PPM), prostate CA, seizure disorder, chronic combined CHF (EF 45-50% in 2014), CKD stage III (per labs), hyponatremia, chronic anemia who presents for f/u of fluid overload. Per Dr. Hassell Done note, he has discussed in depth with the patient that he would not be a surgical candidate. He saw Chanetta Marshall 07/02/16 at which time he was felt to be more volume overloaded with increased LEE and was advised to increase Lasix to 40mg  daily x 2 days then go back to 20mg  daily. Last labs 06/22/16 showed Na 130, Cr 1.21, Hgb 9.5 (10.4 in 2013 to compare). Last echo 214 showed EF 45-50%, severe MR, mod-severe AI, mod elevated RVSP, mod RAE, mod LAE, mild aortic root dilation, dilated IVC. Nuc 2010 was abnormal with mostly fixed inf wall defect c/w prior infarction, subtle peri-infarct ischemia, EF down to 40% - managed medically due to elevated Cr and subtle finding. PPM check 07/02/16 showed normal device function.  He presents today for volume overload. He is extremely hard of hearing and relies on his wife to tell the majority of the history. For the past 3-4 weeks he's noticed increasing weight gain of 10lbs from baseline (per home weight), increased abdominal girth, LEE, and dyspnea on exertion.[name] He does not feel like the Lasix is helping much anymore. BP is 122/54 in clinic - his wife says it sometimes runs lower in clinic.  Past Medical History:  Diagnosis Date  . Anticoagulated  on Coumadin   . Anxiety   . Aortic insufficiency    a. mod-severe by echo 2014.  [name] Chronic anemia   . Chronic combined systolic and diastolic CHF (congestive heart failure) ([place])   . CKD (chronic kidney disease), stage III   . Coronary artery disease    a. s/p MI 1995.  [name] Hypertension   . Hyponatremia   . Mitral valve regurgitation   . Permanent atrial fibrillation ([place])   . Prostate cancer ([place]) 2002  . Seizure disorder ([place])   . Severe mitral regurgitation   . Tachycardia-bradycardia syndrome ([place])    a. w/ sycope - initial implant 2007, last gen change 2013 with BSX dual chamber PPM.    Past Surgical History:  Procedure Laterality Date  . artificial urinary sphincter    . PACEMAKER INSERTION  04/29/06   Generator change (BSc) by Dr Rayann Heman 06/21/12  . PERMANENT PACEMAKER GENERATOR CHANGE N/A 06/08/2012   Procedure: PERMANENT PACEMAKER GENERATOR CHANGE;  Surgeon: Thompson Grayer, MD;  Location: [facility] CATH LAB;  Service: Cardiovascular;  Laterality: N/A;  . PROSTATE SURGERY      Current Medications: Current Outpatient Prescriptions  Medication Sig Dispense Refill  . cholecalciferol (VITAMIN D) 1000 UNITS tablet Take 2,000 Units by mouth daily.    . fish oil-omega-3 fatty acids 1000 MG capsule Take 2 g by mouth daily.    . furosemide (LASIX) 20 MG tablet Take 1 tablet (20 mg total) by mouth daily.    [name] lisinopril (PRINIVIL,ZESTRIL) 10 MG tablet Take 10 mg by mouth daily.    [name]  metoprolol succinate (TOPROL-XL) 50 MG 24 hr tablet TAKE 1/2 TABLET BY MOUTH ONCE DAILY 45 tablet 2  . Multiple Vitamin (MULTIVITAMIN WITH MINERALS) TABS Take 1 tablet by mouth daily.    [name] PHENobarbital (LUMINAL) 97.2 MG tablet Take 97.2 mg by mouth at bedtime.    . sertraline (ZOLOFT) 100 MG tablet Take 100 mg by mouth daily.    . vitamin B-12 (CYANOCOBALAMIN) 1000 MCG tablet Take 1,000 mcg by mouth daily.    [name] warfarin (COUMADIN) 5 MG tablet Take 7.5-10 mg by mouth daily. Monday, Wednesday and Friday take 10mg .  All other days take 7.5mg      No current facility-administered medications for this visit.      Allergies:   Review of patient's allergies indicates no known allergies.   Social History   Social History  . Marital status: Married    Spouse name: N/A  . Number of children: N/A  . Years of education: N/A   Social History Main Topics  . Smoking status: Never Smoker  . Smokeless tobacco: Never Used  . Alcohol use No  . Drug use: No  . Sexual activity: Not Asked   Other Topics Concern  . None   Social History Narrative   Lives in [place]   Retired Furniture conservator/restorer     Family History:  The patient's family history includes Colon polyps in his brother; Heart attack in his brother and father.   ROS:   Please see the history of present illness.  All other systems are reviewed and otherwise negative.    PHYSICAL EXAM:   VS:  BP (!) 122/54   Pulse 60   Resp 16   Ht 5\' 6"  (1.676 m)   Wt 140 lb 8 oz (63.7 kg)   BMI 22.68 kg/m   BMI: Body mass index is 22.68 kg/m. GEN: Frail appearing elderly WM, in no acute distress  HEENT: normocephalic, atraumatic Neck: no JVD, carotid bruits, or masses Cardiac: RRR; no murmurs, rubs, or gallops, LE are kept contained in tight compression hose but do feel somewhat tense, no pitting noted Respiratory:  clear to auscultation bilaterally, normal work of breathing GI: rounded, slight distention, nontender, nondistended, + BS MS: no deformity or atrophy  Skin: warm and dry, no rash Neuro:  Alert and Oriented x 3, Strength and sensation are intact, follows commands Psych: euthymic mood, full affect  Wt Readings from Last 3 Encounters:  07/18/16 140 lb 8 oz (63.7 kg)  07/02/16 131 lb 3.2 oz (59.5 kg)  06/22/16 124 lb (56.2 kg)      Studies/Labs Reviewed:   EKG:  EKG was ordered today and personally reviewed by me and demonstrates V paced rhythm 60bpm, LBBB, one PVCs   Recent Labs: 09/07/2015: Brain Natriuretic Peptide  2,098.3 06/22/2016: BUN 28; Creatinine, Ser 1.21; Hemoglobin 9.5; Platelets 121; Potassium 4.5; Sodium 130   Lipid Panel   Additional studies/ records that were reviewed today include: Summarized above.    ASSESSMENT & PLAN:   1. Acute on chronic combined CHF - symptoms felt most consistent with acute on chronic CHF. Suspect poor absorption of Lasix due to gut edema. Will check CMET, CBC, lytes, BNP today. Will d/c Lasix and start Torsemide 20mg  daily. I told his wife if his UOP does not pick up with weight loss in the next 2-3 days she should increase torsemide to 1.5 tablets daily. I would like him seen in clinic in 4-5 days because if there is no clinical response to increasing torsemide,  may need to consider admission for IV diuresis. Will hold lisinopril to allow more BP room for diuresis, also as a renal precaution given his CKD. 2. Severe MR/mod-severe AI - not felt to be a surgical candidate per Dr. Irish Lack. 3. CKD stage III/hyponatremia - f/u sodium and Cr today. 4. Essential HTN - follow with above changes. Holding lisinopril as above. 5. Permanent atrial fib - he had recent fall in early October. F/u CBC. If he has another fall, may need to revisit issue of anticoagulaiton.  Disposition: F/u with APP in 4-5 days for close recheck.  Medication Adjustments/Labs and Tests Ordered: Current medicines are reviewed at length with the patient today.  Concerns regarding medicines are outlined above. Medication changes, Labs and Tests ordered today are summarized above and listed in the Patient Instructions accessible in Encounters.   Raechel Ache PA-C  07/18/2016 2:24 PM    [address], [place], [place]  [address] Phone: [phone]; Fax: [phone]

## 2016-07-19 LAB — COMPREHENSIVE METABOLIC PANEL
ALBUMIN: 3.8 g/dL (ref 3.6–5.1)
ALT: 11 U/L (ref 9–46)
AST: 21 U/L (ref 10–35)
Alkaline Phosphatase: 115 U/L (ref 40–115)
BUN: 29 mg/dL — ABNORMAL HIGH (ref 7–25)
CALCIUM: 8.8 mg/dL (ref 8.6–10.3)
CHLORIDE: 93 mmol/L — AB (ref 98–110)
CO2: 21 mmol/L (ref 20–31)
Creat: 1.19 mg/dL — ABNORMAL HIGH (ref 0.70–1.11)
GLUCOSE: 89 mg/dL (ref 65–99)
Potassium: 4.8 mmol/L (ref 3.5–5.3)
Sodium: 125 mmol/L — ABNORMAL LOW (ref 135–146)
Total Bilirubin: 1 mg/dL (ref 0.2–1.2)
Total Protein: 6.5 g/dL (ref 6.1–8.1)

## 2016-07-19 LAB — BRAIN NATRIURETIC PEPTIDE: BRAIN NATRIURETIC PEPTIDE: 2527.7 pg/mL — AB (ref <100)

## 2016-07-19 LAB — TSH: TSH: 0.92 mIU/L (ref 0.40–4.50)

## 2016-07-22 ENCOUNTER — Emergency Department (HOSPITAL_COMMUNITY): Payer: Medicare Other

## 2016-07-22 ENCOUNTER — Ambulatory Visit: Payer: Medicare Other | Admitting: Physician Assistant

## 2016-07-22 ENCOUNTER — Inpatient Hospital Stay (HOSPITAL_COMMUNITY)
Admission: EM | Admit: 2016-07-22 | Discharge: 2016-07-28 | DRG: 291 | Disposition: A | Discharge status: Home Health | Payer: Medicare Other | Attending: Internal Medicine | Admitting: Internal Medicine

## 2016-07-22 ENCOUNTER — Encounter (HOSPITAL_COMMUNITY): Payer: Self-pay | Admitting: Emergency Medicine

## 2016-07-22 DIAGNOSIS — R296 Repeated falls: Secondary | ICD-10-CM | POA: Diagnosis present

## 2016-07-22 DIAGNOSIS — R601 Generalized edema: Secondary | ICD-10-CM

## 2016-07-22 DIAGNOSIS — I5043 Acute on chronic combined systolic (congestive) and diastolic (congestive) heart failure: Secondary | ICD-10-CM | POA: Diagnosis present

## 2016-07-22 DIAGNOSIS — K5900 Constipation, unspecified: Secondary | ICD-10-CM | POA: Diagnosis present

## 2016-07-22 DIAGNOSIS — I482 Chronic atrial fibrillation: Secondary | ICD-10-CM | POA: Diagnosis present

## 2016-07-22 DIAGNOSIS — I130 Hypertensive heart and chronic kidney disease with heart failure and stage 1 through stage 4 chronic kidney disease, or unspecified chronic kidney disease: Principal | ICD-10-CM | POA: Diagnosis present

## 2016-07-22 DIAGNOSIS — Z8249 Family history of ischemic heart disease and other diseases of the circulatory system: Secondary | ICD-10-CM

## 2016-07-22 DIAGNOSIS — I77810 Thoracic aortic ectasia: Secondary | ICD-10-CM | POA: Diagnosis present

## 2016-07-22 DIAGNOSIS — R230 Cyanosis: Secondary | ICD-10-CM | POA: Diagnosis present

## 2016-07-22 DIAGNOSIS — D638 Anemia in other chronic diseases classified elsewhere: Secondary | ICD-10-CM | POA: Diagnosis not present

## 2016-07-22 DIAGNOSIS — Z515 Encounter for palliative care: Secondary | ICD-10-CM

## 2016-07-22 DIAGNOSIS — Z950 Presence of cardiac pacemaker: Secondary | ICD-10-CM

## 2016-07-22 DIAGNOSIS — G40909 Epilepsy, unspecified, not intractable, without status epilepticus: Secondary | ICD-10-CM | POA: Diagnosis not present

## 2016-07-22 DIAGNOSIS — I351 Nonrheumatic aortic (valve) insufficiency: Secondary | ICD-10-CM | POA: Diagnosis present

## 2016-07-22 DIAGNOSIS — E871 Hypo-osmolality and hyponatremia: Secondary | ICD-10-CM | POA: Diagnosis present

## 2016-07-22 DIAGNOSIS — I2510 Atherosclerotic heart disease of native coronary artery without angina pectoris: Secondary | ICD-10-CM | POA: Diagnosis present

## 2016-07-22 DIAGNOSIS — Z7189 Other specified counseling: Secondary | ICD-10-CM

## 2016-07-22 DIAGNOSIS — N183 Chronic kidney disease, stage 3 unspecified: Secondary | ICD-10-CM | POA: Diagnosis present

## 2016-07-22 DIAGNOSIS — R627 Adult failure to thrive: Secondary | ICD-10-CM | POA: Diagnosis not present

## 2016-07-22 DIAGNOSIS — Z7901 Long term (current) use of anticoagulants: Secondary | ICD-10-CM

## 2016-07-22 DIAGNOSIS — R0602 Shortness of breath: Secondary | ICD-10-CM | POA: Diagnosis not present

## 2016-07-22 DIAGNOSIS — Z79899 Other long term (current) drug therapy: Secondary | ICD-10-CM

## 2016-07-22 DIAGNOSIS — Z951 Presence of aortocoronary bypass graft: Secondary | ICD-10-CM

## 2016-07-22 DIAGNOSIS — I248 Other forms of acute ischemic heart disease: Secondary | ICD-10-CM | POA: Diagnosis present

## 2016-07-22 DIAGNOSIS — R0902 Hypoxemia: Secondary | ICD-10-CM | POA: Diagnosis present

## 2016-07-22 DIAGNOSIS — Z66 Do not resuscitate: Secondary | ICD-10-CM | POA: Diagnosis present

## 2016-07-22 DIAGNOSIS — H9190 Unspecified hearing loss, unspecified ear: Secondary | ICD-10-CM | POA: Diagnosis present

## 2016-07-22 DIAGNOSIS — I10 Essential (primary) hypertension: Secondary | ICD-10-CM | POA: Diagnosis present

## 2016-07-22 DIAGNOSIS — Z8546 Personal history of malignant neoplasm of prostate: Secondary | ICD-10-CM

## 2016-07-22 DIAGNOSIS — I252 Old myocardial infarction: Secondary | ICD-10-CM

## 2016-07-22 DIAGNOSIS — I4821 Permanent atrial fibrillation: Secondary | ICD-10-CM | POA: Diagnosis present

## 2016-07-22 DIAGNOSIS — I340 Nonrheumatic mitral (valve) insufficiency: Secondary | ICD-10-CM | POA: Diagnosis present

## 2016-07-22 LAB — I-STAT ARTERIAL BLOOD GAS, ED
Acid-base deficit: 1 mmol/L (ref 0.0–2.0)
Bicarbonate: 20.7 mmol/L (ref 20.0–28.0)
O2 SAT: 93 %
TCO2: 22 mmol/L (ref 0–100)
pCO2 arterial: 26.7 mmHg — ABNORMAL LOW (ref 32.0–48.0)
pH, Arterial: 7.497 — ABNORMAL HIGH (ref 7.350–7.450)
pO2, Arterial: 60 mmHg — ABNORMAL LOW (ref 83.0–108.0)

## 2016-07-22 LAB — CBC WITH DIFFERENTIAL/PLATELET
BASOS PCT: 0 %
Basophils Absolute: 0 10*3/uL (ref 0.0–0.1)
EOS ABS: 0.1 10*3/uL (ref 0.0–0.7)
EOS PCT: 2 %
HCT: 29.8 % — ABNORMAL LOW (ref 39.0–52.0)
Hemoglobin: 10 g/dL — ABNORMAL LOW (ref 13.0–17.0)
LYMPHS ABS: 0.8 10*3/uL (ref 0.7–4.0)
Lymphocytes Relative: 15 %
MCH: 28.2 pg (ref 26.0–34.0)
MCHC: 33.6 g/dL (ref 30.0–36.0)
MCV: 83.9 fL (ref 78.0–100.0)
Monocytes Absolute: 0.7 10*3/uL (ref 0.1–1.0)
Monocytes Relative: 14 %
NEUTROS PCT: 69 %
Neutro Abs: 3.6 10*3/uL (ref 1.7–7.7)
PLATELETS: 114 10*3/uL — AB (ref 150–400)
RBC: 3.55 MIL/uL — AB (ref 4.22–5.81)
RDW: 16.1 % — ABNORMAL HIGH (ref 11.5–15.5)
WBC: 5.3 10*3/uL (ref 4.0–10.5)

## 2016-07-22 LAB — COMPREHENSIVE METABOLIC PANEL
ALBUMIN: 3.7 g/dL (ref 3.5–5.0)
ALT: 15 U/L — AB (ref 17–63)
ANION GAP: 10 (ref 5–15)
AST: 25 U/L (ref 15–41)
Alkaline Phosphatase: 99 U/L (ref 38–126)
BUN: 31 mg/dL — ABNORMAL HIGH (ref 6–20)
CHLORIDE: 93 mmol/L — AB (ref 101–111)
CO2: 23 mmol/L (ref 22–32)
CREATININE: 1.47 mg/dL — AB (ref 0.61–1.24)
Calcium: 9 mg/dL (ref 8.9–10.3)
GFR calc non Af Amer: 41 mL/min — ABNORMAL LOW (ref >60)
GFR, EST AFRICAN AMERICAN: 48 mL/min — AB (ref >60)
GLUCOSE: 118 mg/dL — AB (ref 65–99)
Potassium: 3.9 mmol/L (ref 3.5–5.1)
SODIUM: 126 mmol/L — AB (ref 135–145)
Total Bilirubin: 1 mg/dL (ref 0.3–1.2)
Total Protein: 6.9 g/dL (ref 6.5–8.1)

## 2016-07-22 LAB — URINALYSIS, ROUTINE W REFLEX MICROSCOPIC
Bilirubin Urine: NEGATIVE
GLUCOSE, UA: NEGATIVE mg/dL
Hgb urine dipstick: NEGATIVE
Ketones, ur: NEGATIVE mg/dL
LEUKOCYTES UA: NEGATIVE
NITRITE: NEGATIVE
PH: 6 (ref 5.0–8.0)
Protein, ur: NEGATIVE mg/dL
Specific Gravity, Urine: 1.007 (ref 1.005–1.030)

## 2016-07-22 LAB — BRAIN NATRIURETIC PEPTIDE: B Natriuretic Peptide: 2423.5 pg/mL — ABNORMAL HIGH (ref 0.0–100.0)

## 2016-07-22 LAB — COOXEMETRY PANEL
CARBOXYHEMOGLOBIN: 1 % (ref 0.5–1.5)
METHEMOGLOBIN: 1 % (ref 0.0–1.5)
O2 Saturation: 24.5 %
TOTAL HEMOGLOBIN: 9.3 g/dL — AB (ref 12.0–16.0)

## 2016-07-22 LAB — PROTIME-INR
INR: 3.85
Prothrombin Time: 38.8 seconds — ABNORMAL HIGH (ref 11.4–15.2)

## 2016-07-22 LAB — TROPONIN I: TROPONIN I: 0.04 ng/mL — AB (ref <0.03)

## 2016-07-22 MED ORDER — SODIUM CHLORIDE 0.9% FLUSH
3.0000 mL | Freq: Two times a day (BID) | INTRAVENOUS | Status: DC
  Administered 2016-07-22 – 2016-07-28 (×13): 3 mL via INTRAVENOUS

## 2016-07-22 MED ORDER — ADULT MULTIVITAMIN W/MINERALS CH: 1.0000 | ORAL_TABLET | Freq: Every day | ORAL | Status: DC

## 2016-07-22 MED ORDER — SODIUM CHLORIDE 0.9% FLUSH: 3.0000 mL | INTRAVENOUS | Status: DC | PRN

## 2016-07-22 MED ORDER — PHENOBARBITAL 97.2 MG PO TABS
100.0000 mg | ORAL_TABLET | Freq: Every day | ORAL | Status: DC
  Administered 2016-07-23 – 2016-07-27 (×6): 97.2 mg via ORAL
  Filled 2016-07-22 (×6): qty 1

## 2016-07-22 MED ORDER — SERTRALINE HCL 100 MG PO TABS
100.0000 mg | ORAL_TABLET | Freq: Every day | ORAL | Status: DC
  Administered 2016-07-22 – 2016-07-28 (×7): 100 mg via ORAL
  Filled 2016-07-22 (×7): qty 1

## 2016-07-22 MED ORDER — ACETAMINOPHEN 325 MG PO TABS
650.0000 mg | ORAL_TABLET | ORAL | Status: DC | PRN
  Administered 2016-07-27: 650 mg via ORAL
  Filled 2016-07-22: qty 2

## 2016-07-22 MED ORDER — WARFARIN - PHARMACIST DOSING INPATIENT
Freq: Every day | Status: DC
  Administered 2016-07-25 – 2016-07-27 (×2)

## 2016-07-22 MED ORDER — SODIUM CHLORIDE 0.9 % IV SOLN: 250.0000 mL | INTRAVENOUS | Status: DC | PRN

## 2016-07-22 MED ORDER — FUROSEMIDE 10 MG/ML IJ SOLN
80.0000 mg | Freq: Two times a day (BID) | INTRAMUSCULAR | Status: DC
  Administered 2016-07-22 – 2016-07-25 (×7): 80 mg via INTRAVENOUS
  Filled 2016-07-22 (×8): qty 8

## 2016-07-22 MED ORDER — FUROSEMIDE 10 MG/ML IJ SOLN
80.0000 mg | Freq: Once | INTRAMUSCULAR | Status: AC
  Administered 2016-07-22: 80 mg via INTRAVENOUS
  Filled 2016-07-22: qty 8

## 2016-07-22 MED ORDER — ONDANSETRON HCL 4 MG/2ML IJ SOLN: 4.0000 mg | Freq: Four times a day (QID) | INTRAMUSCULAR | Status: DC | PRN

## 2016-07-22 NOTE — ED Notes (Signed)
Wife reports recent fluid retention. Swelling noted to bilateral lower legs. History of CHF and kidney failure.

## 2016-07-22 NOTE — ED Triage Notes (Signed)
Unable to get pulse ox and temperature in triage.

## 2016-07-22 NOTE — ED Provider Notes (Signed)
[facility] DEPT Provider Note   CSN: [id number] Arrival date & time: 07/22/16  8466     History   Chief Complaint Chief Complaint  Patient presents with  . Cyanosis  . Congestive Heart Failure    HPI Eddie Park is a 82 y.o. male.  The history is provided by the spouse, the EMS personnel and medical records.  Congestive Heart Failure  This is a chronic problem. The current episode started more than 1 week ago. The problem occurs constantly. The problem has been gradually worsening. Associated symptoms include shortness of breath. Pertinent negatives include no chest pain and no headaches. Nothing aggravates the symptoms. Nothing relieves the symptoms. He has tried nothing for the symptoms.    Past Medical History:  Diagnosis Date  . Anticoagulated on Coumadin   . Anxiety   . Aortic insufficiency    a. mod-severe by echo 2014.  [name] Chronic anemia   . Chronic combined systolic and diastolic CHF (congestive heart failure) ([place])   . CKD (chronic kidney disease), stage III   . Coronary artery disease    a. s/p MI 1995.  [name] Hypertension   . Hyponatremia   . Mitral valve regurgitation   . Permanent atrial fibrillation ([place])   . Prostate cancer ([place]) 2002  . Seizure disorder ([place])   . Severe mitral regurgitation   . Tachycardia-bradycardia syndrome ([place])    a. w/ sycope - initial implant 2007, last gen change 2013 with BSX dual chamber PPM.    Patient Active Problem List   Diagnosis Date Noted  . Aortic insufficiency 07/18/2016  . Chronic combined systolic and diastolic CHF (congestive heart failure) ([place]) 07/18/2016  . Coronary atherosclerosis of native coronary artery 01/25/2014  . Severe mitral regurgitation 01/25/2014  . Aortic valve disorders 01/25/2014  . Encounter for therapeutic drug monitoring 10/19/2013  . Long term current use of anticoagulant therapy 07/21/2013  . [employer] 06/10/2012  . Permanent atrial fibrillation ([place])  06/08/2012  . Tachycardia-bradycardia syndrome ([place]) 06/08/2012  . Hypertension 06/08/2012    Past Surgical History:  Procedure Laterality Date  . artificial urinary sphincter    . PACEMAKER INSERTION  04/29/06   Generator change (BSc) by Dr Rayann Heman 06/21/12  . PERMANENT PACEMAKER GENERATOR CHANGE N/A 06/08/2012   Procedure: PERMANENT PACEMAKER GENERATOR CHANGE;  Surgeon: Thompson Grayer, MD;  Location[facility] CATH LAB;  Service: Cardiovascular;  Laterality: N/A;  . PROSTATE SURGERY         Home Medications    Prior to Admission medications   Medication Sig Start Date End Date Taking? Authorizing Provider  cholecalciferol (VITAMIN D) 1000 UNITS tablet Take 2,000 Units by mouth daily.   Yes Historical Provider, MD  fish oil-omega-3 fatty acids 1000 MG capsule Take 2 g by mouth daily.   Yes Historical Provider, MD  metoprolol succinate (TOPROL-XL) 50 MG 24 hr tablet TAKE 1/2 TABLET BY MOUTH ONCE DAILY 02/19/16  Yes Jettie Booze, MD  Multiple Vitamin (MULTIVITAMIN WITH MINERALS) TABS Take 1 tablet by mouth daily.   Yes Historical Provider, MD  PHENobarbital (LUMINAL) 97.2 MG tablet Take 97.2 mg by mouth at bedtime.   Yes Historical Provider, MD  sertraline (ZOLOFT) 100 MG tablet Take 100 mg by mouth daily.   Yes Historical Provider, MD  torsemide (DEMADEX) 20 MG tablet Take 1 tablet (20 mg total) by mouth daily. 07/18/16 10/16/16 Yes Dayna N Dunn, PA-C  vitamin B-12 (CYANOCOBALAMIN) 1000 MCG tablet Take 1,000 mcg by mouth daily.   Yes Historical  Provider, MD  warfarin (COUMADIN) 5 MG tablet Take 7.5-10 mg by mouth daily. Monday, Wednesday and Friday take 10mg . All other days take 7.5mg    Yes Historical Provider, MD    Family History Family History  Problem Relation Age of Onset  . Colon polyps Brother   . Heart attack Father   . Heart attack Brother     Social History Social History  Substance Use Topics  . Smoking status: Never Smoker  . Smokeless tobacco: Never Used  . Alcohol  use No     Allergies   Review of patient's allergies indicates no known allergies.   Review of Systems Review of Systems  Constitutional: Positive for activity change and appetite change. Negative for fever.  Respiratory: Positive for cough and shortness of breath.   Cardiovascular: Negative for chest pain.  Skin: Positive for rash.  Neurological: Negative for headaches.  All other systems reviewed and are negative.    Physical Exam Updated Vital Signs BP 120/67 (BP Location: Right Arm)   Pulse 62   Temp 98.4 F (36.9 C) (Rectal)   Resp 15   SpO2 94%   Physical Exam  Constitutional: He appears well-developed and well-nourished.  HENT:  Head: Normocephalic and atraumatic.  Blue lips  Eyes: Conjunctivae and EOM are normal.  Neck: Normal range of motion.  Cardiovascular: Normal rate.   Pulmonary/Chest: Effort normal. No respiratory distress. He has rales (mild bases).  Abdominal: Soft. He exhibits no distension. There is no tenderness.  Musculoskeletal: Normal range of motion. He exhibits no edema or deformity.  Neurological: He is alert.  Skin: Skin is warm and dry.  Blue nail beds  Nursing note and vitals reviewed.    ED Treatments / Results  Labs (all labs ordered are listed, but only abnormal results are displayed) Labs Reviewed  CBC WITH DIFFERENTIAL/PLATELET - Abnormal; Notable for the following:       Result Value   RBC 3.55 (*)    Hemoglobin 10.0 (*)    HCT 29.8 (*)    RDW 16.1 (*)    All other components within normal limits  I-STAT ARTERIAL BLOOD GAS, ED - Abnormal; Notable for the following:    pH, Arterial 7.497 (*)    pCO2 arterial 26.7 (*)    pO2, Arterial 60.0 (*)    All other components within normal limits  BLOOD GAS, ARTERIAL  COMPREHENSIVE METABOLIC PANEL  BRAIN NATRIURETIC PEPTIDE  TROPONIN I  COOXEMETRY PANEL    EKG  EKG Interpretation None       Radiology Dg Chest 2 View  Result Date: 07/22/2016 CLINICAL DATA:   Weakness, shortness of breath for 1 week, question pulmonary edema, history coronary artery disease, hypertension, stage III chronic kidney disease, valvular heart disease EXAM: CHEST  2 VIEW COMPARISON:  03/29/2012 FINDINGS: LEFT subclavian transvenous pacemaker leads project at RIGHT atrium and RIGHT ventricle, unchanged. Enlargement of cardiac silhouette with pulmonary vascular congestion. Atherosclerotic calcification aorta. RIGHT pleural effusion and significant basilar atelectasis. Minimal LEFT basilar atelectasis. Underlying emphysematous changes. No definite pulmonary edema or pneumothorax. Bones diffusely demineralized. IMPRESSION: Enlargement of cardiac silhouette with pulmonary vascular congestion post pacemaker. COPD changes with RIGHT pleural effusion and significant RIGHT basilar atelectasis. Aortic atherosclerosis. Electronically Signed   By: Lavonia Dana M.D.   On: 07/22/2016 09:34    Procedures Procedures (including critical care time)  Medications Ordered in ED Medications - No data to display   Initial Impression / Assessment and Plan / ED Course  I have  reviewed the triage vital signs and the nursing notes.  Pertinent labs & imaging results that were available during my care of the patient were reviewed by me and considered in my medical decision making (see chart for details).  Clinical Course   Acute on chronic CHF. Will plan for workup/lasix IV and admission. Needs palliative consult as family is considering hospice.    Final Clinical Impressions(s) / ED Diagnoses   Final diagnoses:  Anasarca    New Prescriptions New Prescriptions   No medications on file     Merrily Pew, MD 07/24/16 0045

## 2016-07-22 NOTE — Progress Notes (Signed)
Pt was admitted to unit on stretcher accompanied by the ED staff member.   Wife at beside.   Skin assessment conducted- skin tear on left upper arm and ecchymosis on the left hip. Bilateral leg discoloration due to venous stasis. Bilateral leg edema +3 noted.   Left and right forearm peripheral IV- Both saline locked.  Condom Cath. Was placed in ED- still intact upon admission ( IV lasix)   Pt has upper and lower dentures - in use.  Hearing aid in left ear.  Pt on telebox #6. History of afib. Permament pacemaker.    Pt is a high fall risk due to recent fall within the last 6 months- bed alarm on and yellow socks placed on pt.   Orientated pt to room. Placed call light and phone within reach. Educated pt on calling before getting up.      Paulla Fore, RN

## 2016-07-22 NOTE — Progress Notes (Signed)
Alerted by tele that the pt's heart rate was down in the 35's.   Assessed pt- he was sleeping. Took VS upon assessing. BP 100/50, 98% O2 saturation, Pulse 59.   Pt is asymptomatic- alert and orientated to place, person and time.   Will continue to monitor.   Paulla Fore, RN

## 2016-07-22 NOTE — H&P (Signed)
History and Physical    Eddie Park JQB:[id number] DOB: 1926/10/03 DOA: 07/22/2016   PCP: Gerrit Heck, MD   Patient coming from/Resides with: Private residence/lives with wife  Admission status: Observation/telemetry -needs to be reevaluated within the next 24 hours to determine if it will be medically necessary to stay a minimum 2 midnights to rule out impending and/or unexpected changes in physiologic status that may differ from initial evaluation performed in the ER and/or at time of admission. Patient presents with acute decompensated chronic combined systolic and diastolic heart failure in the setting of severe multi-valvular heart disease who has failed outpatient diuretic therapy. Patient will require IV diuretics at moderate to high dose for at least 24 hours. He also demonstrates symptoms of failure to thrive and if not responding adequately to diuretic therapy plan is to review goals of care and potentially adjust focus from aggressive to comfort.  Chief Complaint: Increasing abdominal fluid with shortness of breath and failure to thrive  HPI: Eddie Park is a 82 y.o. male with medical history significant for known chronic combined systolic and diastolic heart failure in the setting of mitral regurgitation and aortic insufficiency, chronic kidney disease stage III, hypertension, atrial fibrillation on Coumadin, chronic hyponatremia, anemia of chronic disease. Patient was recently evaluated at the cardiology office on 10/27 with symptoms concerning for acute heart failure exacerbation. Was felt due to degree gut edema that his Lasix was not being absorbed properly so he was transitioned to torsemide 20 mg daily. Lisinopril was held to allow BP more room for diuresis. Recommendation was to increase the tablet to 1.5 if no significant increase in edema seen for 2-3 days following that clinic visit. A follow-up visit in 4-5 days was also recommended. Unfortunately the  patient has not responded to the diuretics and appears to be more hypoxemic and having more difficulty breathing and reporting discomfort secondary to abdominal swelling. The swelling in his feet has decreased.  In addition, patient has been having some falls and this is concerning in setting of chronic anticoagulation. He has had progressive failure to thrive over several weeks without any focal neurological deficits. Patient is a DO NOT RESUSCITATE but wife is well aware that patient may be meeting the end stages of his process and if he does not show significant response to diuretics in the next 24-48 hours that goals of care need to be addressed.  ED Course:  Vital Signs: BP 126/55   Pulse 62   Temp 98.4 F (36.9 C) (Rectal)   Resp 13   SpO2 97%  2 view chest x-ray: Pulmonary vascular congestion, COPD with right pleural effusion with significant right basilar atelectasis Lab data: Sodium 126, potassium 3.9, chloride 93, CO2 23, BUN 31, creatinine 1.47, glucose 118, LFTs nonelevated, BNP 2424, troponin 0.04, white count 5300 with normal differential, hemoglobin 10, platelets 114,000 ABG: PH 7.49, PCO2 27, PO2 60, acid base 1, bicarbonate 21 Medications and treatments: Lasix 80 mg IV 1  Review of Systems:  In addition to the HPI above,  No Fever-chills, myalgias or other constitutional symptoms-patient describes "always feeling cold" No Headache, changes with Vision or hearing, new weakness, tingling, numbness in any extremity, dizziness, dysarthria or word finding difficulty, tremors or seizure activity No problems swallowing food or Liquids, indigestion/reflux, choking or coughing while eating, abdominal pain with or after eating-poor oral intake/anorexia No Chest pain, Cough or palpitations No Abdominal pain, N/V, melena,hematochezia, dark tarry stools, constipation-reports abdominal distention and sensation of fullness after eating  only a few bites No dysuria, malodorous urine,  hematuria or flank pain No new skin rashes, lesions, masses or bruises, No new joint pains, aches, swelling or redness No recent unintentional weight loss No polyuria, polydypsia or polyphagia   Past Medical History:  Diagnosis Date  . Anticoagulated on Coumadin   . Anxiety   . Aortic insufficiency    a. mod-severe by echo 2014.  [name] Chronic anemia   . Chronic combined systolic and diastolic CHF (congestive heart failure) ([place])   . CKD (chronic kidney disease), stage III   . Coronary artery disease    a. s/p MI 1995.  [name] Hypertension   . Hyponatremia   . Mitral valve regurgitation   . Permanent atrial fibrillation ([place])   . Prostate cancer ([place]) 2002  . Seizure disorder ([place])   . Severe mitral regurgitation   . Tachycardia-bradycardia syndrome ([place])    a. w/ sycope - initial implant 2007, last gen change 2013 with BSX dual chamber PPM.    Past Surgical History:  Procedure Laterality Date  . artificial urinary sphincter    . PACEMAKER INSERTION  04/29/06   Generator change (BSc) by Dr Rayann Heman 06/21/12  . PERMANENT PACEMAKER GENERATOR CHANGE N/A 06/08/2012   Procedure: PERMANENT PACEMAKER GENERATOR CHANGE;  Surgeon: Thompson Grayer, MD;  Location: [facility] CATH LAB;  Service: Cardiovascular;  Laterality: N/A;  . PROSTATE SURGERY      Social History   Social History  . Marital status: Married    Spouse name: N/A  . Number of children: N/A  . Years of education: N/A   Occupational History  . Not on file.   Social History Main Topics  . Smoking status: Never Smoker  . Smokeless tobacco: Never Used  . Alcohol use No  . Drug use: No  . Sexual activity: Not on file   Other Topics Concern  . Not on file   Social History Narrative   Lives in [place]   Retired machinist    Mobility: Conservation officer, nature Work history: Retired   No Known Allergies  Family History  Problem Relation Age of Onset  . Colon polyps Brother   . Heart attack Father   . Heart attack Brother       Prior to Admission medications   Medication Sig Start Date End Date Taking? Authorizing Provider  cholecalciferol (VITAMIN D) 1000 UNITS tablet Take 2,000 Units by mouth daily.   Yes Historical Provider, MD  fish oil-omega-3 fatty acids 1000 MG capsule Take 2 g by mouth daily.   Yes Historical Provider, MD  metoprolol succinate (TOPROL-XL) 50 MG 24 hr tablet TAKE 1/2 TABLET BY MOUTH ONCE DAILY 02/19/16  Yes Jettie Booze, MD  Multiple Vitamin (MULTIVITAMIN WITH MINERALS) TABS Take 1 tablet by mouth daily.   Yes Historical Provider, MD  PHENobarbital (LUMINAL) 97.2 MG tablet Take 97.2 mg by mouth at bedtime.   Yes Historical Provider, MD  sertraline (ZOLOFT) 100 MG tablet Take 100 mg by mouth daily.   Yes Historical Provider, MD  torsemide (DEMADEX) 20 MG tablet Take 1 tablet (20 mg total) by mouth daily. 07/18/16 10/16/16 Yes Dayna N Dunn, PA-C  vitamin B-12 (CYANOCOBALAMIN) 1000 MCG tablet Take 1,000 mcg by mouth daily.   Yes Historical Provider, MD  warfarin (COUMADIN) 5 MG tablet Take 7.5-10 mg by mouth daily. Monday, Wednesday and Friday take 10mg . All other days take 7.5mg    Yes Historical Provider, MD    Physical Exam: Vitals:   07/22/16 1045 07/22/16  1100 07/22/16 1115 07/22/16 1130  BP: 129/58 131/56 124/61 126/55  Pulse: (!) 59 67 (!) 59 62  Resp: 16  18 13   Temp:      TempSrc:      SpO2: 93% 95% (!) 87% 97%      Constitutional: NAD, calm, uncomfortable 2/2 abdominal distention-appears chronically ill Eyes: PERRL, lids and conjunctivae normal ENMT: Mucous membranes are dry. Posterior pharynx clear of any exudate or lesions. Mild circumoral cyanosis  Neck: normal, supple, no masses, no thyromegaly Respiratory: clear to auscultation bilaterally but only able to auscultate anteriorly-markedly decreased in the bases with decreased respiratory effort, no wheezing, no crackles. No accessory muscle use. RA 97% Cardiovascular: Irregular rate w/ wide complex rhythm, no  murmurs / rubs / gallops. 1+ bilateral lower extremity edema. 2+ pedal pulses. No carotid bruits. Hands and feet cold to touch with decreased capillary refill  Abdomen: no tenderness, distended, no masses palpated. No hepatosplenomegaly. Bowel sounds positive but tinkling.  Musculoskeletal: no clubbing No joint deformity upper and lower extremities. Good but weak ROM, no contractures. Normal muscle tone.  Skin: no rashes, lesions, ulcers. No induration Neurologic: CN 2-12 grossly intact. Sensation intact, DTR normal. Strength 4/5 x all 4 extremities.  Psychiatric: Awake and appears oriented x 3-speech difficult to understand. Normal mood.    Labs on Admission: I have personally reviewed following labs and imaging studies  CBC:  Recent Labs Lab 07/18/16 1524 07/22/16 0845  WBC 4.7 5.3  NEUTROABS  --  3.6  HGB 9.5* 10.0*  HCT 28.7* 29.8*  MCV 86.7 83.9  PLT 131* 811*   Basic Metabolic Panel:  Recent Labs Lab 07/18/16 1524 07/22/16 0845  NA 125* 126*  K 4.8 3.9  CL 93* 93*  CO2 21 23  GLUCOSE 89 118*  BUN 29* 31*  CREATININE 1.19* 1.47*  CALCIUM 8.8 9.0   GFR: Estimated Creatinine Clearance: 31.9 mL/min (by C-G formula based on SCr of 1.47 mg/dL (H)). Liver Function Tests:  Recent Labs Lab 07/18/16 1524 07/22/16 0845  AST 21 25  ALT 11 15*  ALKPHOS 115 99  BILITOT 1.0 1.0  PROT 6.5 6.9  ALBUMIN 3.8 3.7   No results for input(s): LIPASE, AMYLASE in the last 168 hours. No results for input(s): AMMONIA in the last 168 hours. Coagulation Profile:  Recent Labs Lab 07/17/16 1047  INR 3.2   Cardiac Enzymes:  Recent Labs Lab 07/22/16 0845  TROPONINI 0.04*   BNP (last 3 results) No results for input(s): PROBNP in the last 8760 hours. HbA1C: No results for input(s): HGBA1C in the last 72 hours. CBG: No results for input(s): GLUCAP in the last 168 hours. Lipid Profile: No results for input(s): CHOL, HDL, LDLCALC, TRIG, CHOLHDL, LDLDIRECT in the last 72  hours. Thyroid Function Tests: No results for input(s): TSH, T4TOTAL, FREET4, T3FREE, THYROIDAB in the last 72 hours. Anemia Panel: No results for input(s): VITAMINB12, FOLATE, FERRITIN, TIBC, IRON, RETICCTPCT in the last 72 hours. Urine analysis: No results found for: COLORURINE, APPEARANCEUR, LABSPEC, PHURINE, GLUCOSEU, HGBUR, BILIRUBINUR, KETONESUR, PROTEINUR, UROBILINOGEN, NITRITE, LEUKOCYTESUR Sepsis Labs: @LABRCNTIP (procalcitonin:4,lacticidven:4) )No results found for this or any previous visit (from the past 240 hour(s)).   Radiological Exams on Admission: Dg Chest 2 View  Result Date: 07/22/2016 CLINICAL DATA:  Weakness, shortness of breath for 1 week, question pulmonary edema, history coronary artery disease, hypertension, stage III chronic kidney disease, valvular heart disease EXAM: CHEST  2 VIEW COMPARISON:  03/29/2012 FINDINGS: LEFT subclavian transvenous pacemaker leads project at  RIGHT atrium and RIGHT ventricle, unchanged. Enlargement of cardiac silhouette with pulmonary vascular congestion. Atherosclerotic calcification aorta. RIGHT pleural effusion and significant basilar atelectasis. Minimal LEFT basilar atelectasis. Underlying emphysematous changes. No definite pulmonary edema or pneumothorax. Bones diffusely demineralized. IMPRESSION: Enlargement of cardiac silhouette with pulmonary vascular congestion post pacemaker. COPD changes with RIGHT pleural effusion and significant RIGHT basilar atelectasis. Aortic atherosclerosis. Electronically Signed   By: Lavonia Dana M.D.   On: 07/22/2016 09:34    EKG: (Independently reviewed) wide complex rhythm without P waves that may be irregular and therefore consistent with atrial fibrillation with underlying right bundle branch block, QTC also prolonged at 530 ms, no definitive ischemic changes and no pacing spikes seen  Assessment/Plan Principal Problem:   Acute on chronic combined systolic and diastolic CHF, NYHA class 3/ Severe mitral  regurgitation/  Moderate-severe aortic insufficiency/  Anasarca -Presents with symptoms concerning for worsening heart failure (Abnormal chest x-ray findings of pulmonary edema and a pleural effusion, elevated BNP, progressive hyponatremia and subjective sensation of hypoxemia/shortness of breath) who has failed outpatient medical therapy including adjustment in diuretics (Lasix changed to torsemide) -Initiate acute heart failure order set -Aggressive diuresis with Lasix 80 mg IV every 12 hours -Follow renal function closely -Lisinopril has been held for the past 4 days at time of initiation of torsemide so we'll continue to hold this as well as his Lopressor to decrease risk of medication-induced hypotension to allow for optimal diuresis -Daily weights and strict intake and output -Last echocardiogram was in 2015 at [facility] cardiology: EF 45%, severe MR with severe AI and increased RV systolic pressures c/w with biventricular heart failure -According to the outpatient cardiology notes, Dr.Varanasi has concluded that this patient is not a surgical candidate regarding valve repair or replacement -If does not respond within the next 48 hours to aggressive diuresis likely need to rediscuss goals of care and change focus from aggressive management to more comfort focused  Active Problems:   Permanent atrial fibrillation -Currently rate controlled -Holding beta blocker as noted above -Pharmacy to manage warfarin -Recent falls so if response to heart failure management as above may need to readdress permanent anticoagulation -Last INR was checked on 10/26 and was 3.2 -History tachybradycardia syndrome and has dual-chamber pacemaker in place -CHADVASc= 5    Known CAD/MI (1995) -Asymptomatic -Beta blocker on hold as above -Holding omega-3 fatty acids due to poor oral intake    Hypertension -Blood pressure controlled with holding ACE inhibitor and beta blocker to optimize aggressive diuresis     Chronic hyponatremia -Likely related to chronic edema as well as need to utilize loop diuretic -Has trended downward from around 1:30 to 126 in the past few weeks -Cont to follow chemistries    FTT (failure to thrive) in adult -Seems to be reaching the end stages of his heart failure and appears to have less than 3 months life duration -EDP has request palliative evaluation -Patient is DO NOT RESUSCITATE and have discussed with wife if he does not show significant improvement with aggressive diuresis may need to readress goals of care/approach to current care with focus transitioning to comfort -For completeness of exam will check urinalysis and culture to rule out underlying UTI (see below regarding prostate history)    Anemia, chronic disease -Hemoglobin stable at between 9.5 and 10.4   History of prostate cancer -Status post urinary sphincter ring-if requires indwelling Foley catheter this device apparently needs to be deactivated by a urologist    CKD (chronic kidney disease)  stage 3, GFR 30-59 ml/min -Current renal function stable and at baseline      DVT prophylaxis: Warfarin per pharmacy Code Status: DO NOT RESUSCITATE Family Communication: Wife at bedside  Disposition Plan: Anticipate discharge back to preadmission home environment but disposition could change pending patient response to aggressive heart failure management-transitions to comfort measures may need discharge to residential hospice facility Consults called: Palliative medicine/on-call physician     Samella Parr ANP-BC [facility] Pager [phone]   If 7PM-7AM, please contact night-coverage [email or web address] Password [email or web address]  07/22/2016, 11:44 AM

## 2016-07-22 NOTE — Progress Notes (Signed)
ANTICOAGULATION CONSULT NOTE - Initial Consult  Pharmacy Consult for warfarin Indication: atrial fibrillation  No Known Allergies  Patient Measurements:     Vital Signs: Temp: 98 F (36.7 C) (10/31 1227) Temp Source: Oral (10/31 1227) BP: 128/55 (10/31 1227) Pulse Rate: 59 (10/31 1227)  Labs:  Recent Labs  07/22/16 0845 07/22/16 1233  HGB 10.0*  --   HCT 29.8*  --   PLT 114*  --   LABPROT  --  38.8*  INR  --  3.85  CREATININE 1.47*  --   TROPONINI 0.04*  --     Estimated Creatinine Clearance: 31.9 mL/min (by C-G formula based on SCr of 1.47 mg/dL (H)).   Assessment: 9 YOM admitted with SOB and abdominal swelling thought to be from HF exacerbation.  He is on warfarin PTA for AFib- INR on 10/26 was 3.2 and he was told to take 2.5mg  that evening and then resume normal dose of 7.5mg  daily except 10mg  on MWF. INR today on admission elevated at 3.85. Hgb 10, plts 114- thrombocytopenia appears to be chronic.  Goal of Therapy:  INR 2-3 Monitor platelets by anticoagulation protocol: Yes   Plan:  -Hold warfarin tonight -Daily INR -Follow for s/s bleeding and changes to medications which could affect INR  Decklyn Hyder D. Ronaldo Crilly, PharmD, BCPS Clinical Pharmacist Pager: [phone] 07/22/2016 1:46 PM

## 2016-07-22 NOTE — ED Notes (Signed)
Applied condom cath

## 2016-07-22 NOTE — ED Notes (Signed)
Patient in xray 

## 2016-07-22 NOTE — ED Triage Notes (Addendum)
Pt looks pale with bluish lips and wife reports is having fluid build up in abdomen. Tried to manage with cardiologist, Raymondo Band increased lasix to try to manage. Wife reports he is anemic. Nail beds cyanotic. Pt been having multiple falls recently. Pt poor historian but wife knowledgeable about medical issues.

## 2016-07-22 NOTE — Consult Note (Signed)
Consultation Note Date: 07/22/2016   Patient Name: Eddie Park  DOB: Jan 23, 1927  MRN: [id number]  Age / Sex: 82 y.o., male  PCP: Eddie Ruff, MD Referring Physician: Waldemar Dickens, MD  Reason for Consultation: Establishing goals of care and Hospice Evaluation  HPI/Patient Profile: 82 y.o. male  with past medical history of severe mitral valve regurgitation, tachy/brady syndrome s/p pacemaker placement, afib on coumadin, prostate cancer, seizure disorder, and CKD stage 3, who was admitted on 07/22/2016 with shortness of breath and increasing abdominal fluid.  He was recently evaluated outpatient by his cardiologist and started on torsemide which with not effective in relieving his symptoms. His BNP is elevated at 2423, but it appears to be chronically over 2000.  He was admitted today and placed on IV diuresis.   He has also been having frequent falls and now uses a walker.     Clinical Assessment and Goals of Care: I met with the patient at bedside and then spoke to his wife on the phone.  He lives at home with his wife, who is his primary care taker.  He spends most of his days in the chair watching sports and Court TV shows.  He has 2 children.  His daughter Eddie Park is a Barrister's clerk locally.  Mr. Mullett can answer some questions but is very hard of hearing and after about 10 minutes of conversation he becomes confused.  Mrs. Lampert, Eddie Park, tells me she needs help with him at home.  He has been falling frequently.  He still eats well.  She understands he is not a candidate for surgery or invasive procedures.  She also understands how weak his heart has become, but she mentions that she does not think the patient understands.   We discussed Palliative Care versus Hospice Services.  Palliative care does not offer ADL care in the home, rather they stop by once every 6 weeks or so for symptom  control.  Eddie Park needs more help in the home.  We discussed the couples goals of care and whether or not he is hospice appropriate.  Primary Decision Maker:  NEXT OF KIN: Wife Eddie Park and Dtr Eddie Park    SUMMARY OF RECOMMENDATIONS    Uncertain if the patient is hospice eligible at this time.   Will check PT eval.   Consider 2D echo. Would certainly discharge with Palliative Care outpatient follow up and full home health services if he is not Hospice eligible  PMT will attempt to reach daughter Eddie Park and follow up with the patient again in the morning (Eddie Park's number is [phone])  Code Status/Advance Care Planning:  DNR    Symptom Management:   Per primary team.  Constipation:  Senna   Psycho-social/Spiritual:   Desire for further Chaplaincy support:yes  Additional Recommendations: Caregiving  Support/Resources  Prognosis:   Unable to determine  Discharge Planning: Home with Palliative Services and full home health services.      Primary Diagnoses: Present on Admission: . Anasarca . Permanent atrial fibrillation ([place]) .  Acute on chronic combined systolic and diastolic CHF, NYHA class 3 ([place]) . Hypertension . Chronic hyponatremia . Severe mitral regurgitation . Moderate aortic insufficiency . FTT (failure to thrive) in adult . Anemia, chronic disease . CKD (chronic kidney disease) stage 3, GFR 30-59 ml/min   I have reviewed the medical record, interviewed the patient and family, and examined the patient. The following aspects are pertinent.  Past Medical History:  Diagnosis Date  . Anticoagulated on Coumadin   . Anxiety   . Aortic insufficiency    a. mod-severe by echo 2014.  [name] Chronic anemia   . Chronic combined systolic and diastolic CHF (congestive heart failure) ([place])   . CKD (chronic kidney disease), stage III   . Coronary artery disease    a. s/p MI 1995.  [name] Hypertension   . Hyponatremia   . Mitral valve regurgitation   . Permanent  atrial fibrillation ([place])   . Prostate cancer ([place]) 2002  . Seizure disorder ([place])   . Severe mitral regurgitation   . Tachycardia-bradycardia syndrome ([place])    a. w/ sycope - initial implant 2007, last gen change 2013 with BSX dual chamber PPM.   Social History   Social History  . Marital status: Married    Spouse name: N/A  . Number of children: N/A  . Years of education: N/A   Social History Main Topics  . Smoking status: Never Smoker  . Smokeless tobacco: Never Used  . Alcohol use No  . Drug use: No  . Sexual activity: Not Asked   Other Topics Concern  . None   Social History Narrative   Lives in [place]   Retired Furniture conservator/restorer   Family History  Problem Relation Age of Onset  . Colon polyps Brother   . Heart attack Father   . Heart attack Brother    Scheduled Meds: . furosemide  80 mg Intravenous Q12H  . PHENobarbital  97.2 mg Oral QHS  . sertraline  100 mg Oral Daily  . sodium chloride flush  3 mL Intravenous Q12H  . Warfarin - Pharmacist Dosing Inpatient   Does not apply q1800   Continuous Infusions:  PRN Meds:.sodium chloride, acetaminophen, ondansetron (ZOFRAN) IV, sodium chloride flush No Known Allergies Review of Systems: Reports no BM x 3 days and frequent falls with bruising.  Denies SOB, CP, HA, AP, dysuria.    Physical Exam  Thin pleasant elderly male.  HOH CV rrr, no frank m/r/g, no JVD Resp cta no w/c/r, nad Abdomen soft, nt, min distended, active BS Lower Extremities with 2+ pitting bilaterally  Vital Signs: BP (!) 128/55 (BP Location: Right Arm)   Pulse (!) 59   Temp 98 F (36.7 C) (Oral)   Resp 14   SpO2 97%          SpO2: SpO2: 97 % O2 Device:SpO2: 97 % O2 Flow Rate: .   IO: Intake/output summary:  Intake/Output Summary (Last 24 hours) at 07/22/16 1528 Last data filed at 07/22/16 1330  Gross per 24 hour  Intake                0 ml  Output              600 ml  Net             -600 ml    LBM:   Baseline Weight:   Most  recent weight:       Palliative Assessment/Data:     Time In: 2:30 Time  Out: 3:50 Time Total: 80 min. Greater than 50%  of this time was spent counseling and coordinating care related to the above assessment and plan.  Signed by: Imogene Burn, PA-C Palliative Medicine Pager: [phone]  Please contact Palliative Medicine Team phone at [phone] for questions and concerns.  For individual provider: See Shea Evans

## 2016-07-23 DIAGNOSIS — Z66 Do not resuscitate: Secondary | ICD-10-CM | POA: Diagnosis present

## 2016-07-23 DIAGNOSIS — I252 Old myocardial infarction: Secondary | ICD-10-CM | POA: Diagnosis not present

## 2016-07-23 DIAGNOSIS — I482 Chronic atrial fibrillation: Secondary | ICD-10-CM | POA: Diagnosis not present

## 2016-07-23 DIAGNOSIS — K5900 Constipation, unspecified: Secondary | ICD-10-CM | POA: Diagnosis present

## 2016-07-23 DIAGNOSIS — R230 Cyanosis: Secondary | ICD-10-CM | POA: Diagnosis present

## 2016-07-23 DIAGNOSIS — I351 Nonrheumatic aortic (valve) insufficiency: Secondary | ICD-10-CM

## 2016-07-23 DIAGNOSIS — I340 Nonrheumatic mitral (valve) insufficiency: Secondary | ICD-10-CM

## 2016-07-23 DIAGNOSIS — R601 Generalized edema: Secondary | ICD-10-CM | POA: Diagnosis not present

## 2016-07-23 DIAGNOSIS — I10 Essential (primary) hypertension: Secondary | ICD-10-CM

## 2016-07-23 DIAGNOSIS — E871 Hypo-osmolality and hyponatremia: Secondary | ICD-10-CM

## 2016-07-23 DIAGNOSIS — I5023 Acute on chronic systolic (congestive) heart failure: Secondary | ICD-10-CM | POA: Diagnosis not present

## 2016-07-23 DIAGNOSIS — R296 Repeated falls: Secondary | ICD-10-CM | POA: Diagnosis present

## 2016-07-23 DIAGNOSIS — I130 Hypertensive heart and chronic kidney disease with heart failure and stage 1 through stage 4 chronic kidney disease, or unspecified chronic kidney disease: Secondary | ICD-10-CM | POA: Diagnosis present

## 2016-07-23 DIAGNOSIS — R0902 Hypoxemia: Secondary | ICD-10-CM | POA: Diagnosis present

## 2016-07-23 DIAGNOSIS — Z950 Presence of cardiac pacemaker: Secondary | ICD-10-CM | POA: Diagnosis not present

## 2016-07-23 DIAGNOSIS — N183 Chronic kidney disease, stage 3 (moderate): Secondary | ICD-10-CM | POA: Diagnosis not present

## 2016-07-23 DIAGNOSIS — D638 Anemia in other chronic diseases classified elsewhere: Secondary | ICD-10-CM | POA: Diagnosis not present

## 2016-07-23 DIAGNOSIS — I509 Heart failure, unspecified: Secondary | ICD-10-CM | POA: Diagnosis not present

## 2016-07-23 DIAGNOSIS — Z7901 Long term (current) use of anticoagulants: Secondary | ICD-10-CM | POA: Diagnosis not present

## 2016-07-23 DIAGNOSIS — I248 Other forms of acute ischemic heart disease: Secondary | ICD-10-CM | POA: Diagnosis present

## 2016-07-23 DIAGNOSIS — I2510 Atherosclerotic heart disease of native coronary artery without angina pectoris: Secondary | ICD-10-CM | POA: Diagnosis present

## 2016-07-23 DIAGNOSIS — Z7189 Other specified counseling: Secondary | ICD-10-CM | POA: Diagnosis not present

## 2016-07-23 DIAGNOSIS — I77810 Thoracic aortic ectasia: Secondary | ICD-10-CM | POA: Diagnosis present

## 2016-07-23 DIAGNOSIS — G40909 Epilepsy, unspecified, not intractable, without status epilepticus: Secondary | ICD-10-CM | POA: Diagnosis present

## 2016-07-23 DIAGNOSIS — I5043 Acute on chronic combined systolic (congestive) and diastolic (congestive) heart failure: Secondary | ICD-10-CM | POA: Diagnosis not present

## 2016-07-23 DIAGNOSIS — Z8546 Personal history of malignant neoplasm of prostate: Secondary | ICD-10-CM | POA: Diagnosis not present

## 2016-07-23 DIAGNOSIS — Z515 Encounter for palliative care: Secondary | ICD-10-CM | POA: Diagnosis not present

## 2016-07-23 DIAGNOSIS — R627 Adult failure to thrive: Secondary | ICD-10-CM

## 2016-07-23 DIAGNOSIS — H9190 Unspecified hearing loss, unspecified ear: Secondary | ICD-10-CM | POA: Diagnosis present

## 2016-07-23 LAB — PROTIME-INR
INR: 4.03 — AB
PROTHROMBIN TIME: 41.5 s — AB (ref 11.4–15.2)

## 2016-07-23 LAB — BASIC METABOLIC PANEL
ANION GAP: 10 (ref 5–15)
BUN: 28 mg/dL — ABNORMAL HIGH (ref 6–20)
CALCIUM: 8.4 mg/dL — AB (ref 8.9–10.3)
CO2: 25 mmol/L (ref 22–32)
CREATININE: 1.4 mg/dL — AB (ref 0.61–1.24)
Chloride: 93 mmol/L — ABNORMAL LOW (ref 101–111)
GFR, EST AFRICAN AMERICAN: 51 mL/min — AB (ref >60)
GFR, EST NON AFRICAN AMERICAN: 44 mL/min — AB (ref >60)
Glucose, Bld: 93 mg/dL (ref 65–99)
Potassium: 3.6 mmol/L (ref 3.5–5.1)
SODIUM: 128 mmol/L — AB (ref 135–145)

## 2016-07-23 LAB — CBC
HEMATOCRIT: 27 % — AB (ref 39.0–52.0)
Hemoglobin: 9.1 g/dL — ABNORMAL LOW (ref 13.0–17.0)
MCH: 28 pg (ref 26.0–34.0)
MCHC: 33.7 g/dL (ref 30.0–36.0)
MCV: 83.1 fL (ref 78.0–100.0)
PLATELETS: 124 10*3/uL — AB (ref 150–400)
RBC: 3.25 MIL/uL — ABNORMAL LOW (ref 4.22–5.81)
RDW: 16.1 % — AB (ref 11.5–15.5)
WBC: 5 10*3/uL (ref 4.0–10.5)

## 2016-07-23 LAB — URINE CULTURE

## 2016-07-23 MED ORDER — LISINOPRIL 5 MG PO TABS
2.5000 mg | ORAL_TABLET | Freq: Every day | ORAL | Status: DC
  Administered 2016-07-23 – 2016-07-28 (×5): 2.5 mg via ORAL
  Filled 2016-07-23 (×7): qty 1

## 2016-07-23 NOTE — Progress Notes (Signed)
PROGRESS NOTE    Eddie Park  BWI:[id number] DOB: 05-Sep-1927 DOA: 07/22/2016 PCP: Gerrit Heck, MD    Brief Narrative:  Patient is a 82 year old gentleman with history of chronic combined systolic and diastolic heart failure in the setting of mitral regurgitation/aortic insufficiency, chronic kidney disease stage III, hypertension, atrial fibrillation on Coumadin, tachybradycardia syndrome status post PPM presented to the ED with decompensated heart failure. Palliative care and cardiology following.   Assessment & Plan:   Principal Problem:   Acute on chronic combined systolic and diastolic CHF, NYHA class 3 (HCC) Active Problems:   Permanent atrial fibrillation (HCC)   Hypertension   Severe mitral regurgitation   Moderate aortic insufficiency   Anasarca   Chronic hyponatremia   FTT (failure to thrive) in adult   Anemia, chronic disease   CKD (chronic kidney disease) stage 3, GFR 30-59 ml/min   Palliative care encounter   Goals of care, counseling/discussion   Encounter for hospice care discussion  #1 acute on chronic Systolic and diastolic heart failure/anasarca Patient failed outpatient treatment as was recently seen a cardiologist office and Lasix changed to [facility] for better absorption however patient presented to the ED with worsening shortness of breath and was subsequently admitted. Patient denied any chest pain. BNP today noted to be significantly elevated. Troponin minimally elevated at 0.04.2-D echo pending. Patient with 3.050 L urine output over the past 24 hours and is -2.3 L during this hospitalization. Coreg and lisinopril were held to better optimize diuresis. Due to patient's complex cardiac history cardiology has been consulted and appreciate input and recommendations.  #2 history of tachybradycardia syndrome/A. fib status post PPM Patient with a pacemaker. Per nursing patient had heart rates in the 30s however PPM was recently interrogated and  doing fine. Patient has remained asymptomatic. Beta blocker on hold for better diuresis and renal function. Cardiology consulted and appreciate input and recommendations. INR supratherapeutic at 4. The palliative care PA patient with history of increased falls.?? Discontinue Coumadin however will defer to cardiology.  #3 history of coronary artery disease status post CABG Patient being children acute CHF exacerbation on IV diuretics. Beta blocker and ACE inhibitor were held in order to be able to diureses aggressively. Cardiology following.  #4 chronic kidney disease stage III Stable.  #5 chronic hyponatremia Likely secondary to hypervolemic hyponatremia secondary to CHF exacerbation. Monitor with IV diuretics.  #6 failure to thrive in adult Patient noted to have symptoms of failure to thrive. Patient currently DO NOT RESUSCITATE. Patient has been seen in consultation by palliative care were recommending that the minimal patient be discharged with palliative care following.  #7 anemia of chronic disease  H&H stable.  #8 history of prostate cancer Status post urinary sphincter ring.   #9 moderate aortic insufficiency/severe mitral regurgitation Per primary cardiologist note patient not a surgical candidate. Medical management.   DVT prophylaxis: Supratherapeutic INR Code Status: DO NOT RESUSCITATE Family Communication: Updated patient and wife at bedside. Disposition Plan: Pending medical workup and diuresis in either home with palliative care following.   Consultants:   Cardiology: Dr. Claiborne Billings 07/23/2016  Palliative care Dr. Rowe Pavy 07/22/2016  Procedures:   Chest x-ray 07/22/2016    Antimicrobials:  None   Subjective: Patient states shortness of breath has improved. Patient denies any chest pain. Patient sitting on bedside commode. Per nursing patient noted to have heart rates in the 30s overnight while sleeping which improved to the 70s on  awakening.  Objective: Vitals:   07/22/16 1705 07/22/16 2018  07/23/16 0509 07/23/16 1000  BP: (!) 127/53 (!) 113/47 (!) 111/50 (!) 114/42  Pulse: 60 60 61 (!) 56  Resp: 15 16 16 18   Temp: 97.5 F (36.4 C) 98.5 F (36.9 C) 98.6 F (37 C) 98.8 F (37.1 C)  TempSrc: Oral Oral Oral Oral  SpO2: 100% 94% 95% 95%  Weight:  60.1 kg (132 lb 6.4 oz)    Height:        Intake/Output Summary (Last 24 hours) at 07/23/16 1200 Last data filed at 07/23/16 0600  Gross per 24 hour  Intake              560 ml  Output             3050 ml  Net            -2490 ml   Filed Weights   07/22/16 1700 07/22/16 2018  Weight: 61.1 kg (134 lb 9.6 oz) 60.1 kg (132 lb 6.4 oz)    Examination:  General exam: Appears calm and comfortable  Respiratory system: Bibasilar crackles. Fair air movement. Respiratory effort normal. Cardiovascular system: S1 & S2 heard, RRR.+ JVD, murmurs, rubs, gallops or clicks. Trace to 1+ right lower extremity edema. Left lower extremity with trace edema.  Gastrointestinal system: Abdomen is nondistended, soft and nontender. No organomegaly or masses felt. Normal bowel sounds heard. Central nervous system: Alert and oriented. No focal neurological deficits. Extremities: Symmetric 5 x 5 power. Skin: Red rash noted on right posterior lower leg, nontender.  Psychiatry: Judgement and insight appear normal. Mood & affect appropriate.     Data Reviewed: I have personally reviewed following labs and imaging studies  CBC:  Recent Labs Lab 07/18/16 1524 07/22/16 0845 07/23/16 0628  WBC 4.7 5.3 5.0  NEUTROABS  --  3.6  --   HGB 9.5* 10.0* 9.1*  HCT 28.7* 29.8* 27.0*  MCV 86.7 83.9 83.1  PLT 131* 114* 740*   Basic Metabolic Panel:  Recent Labs Lab 07/18/16 1524 07/22/16 0845 07/23/16 0628  NA 125* 126* 128*  K 4.8 3.9 3.6  CL 93* 93* 93*  CO2 21 23 25   GLUCOSE 89 118* 93  BUN 29* 31* 28*  CREATININE 1.19* 1.47* 1.40*  CALCIUM 8.8 9.0 8.4*   GFR: Estimated  Creatinine Clearance: 31.6 mL/min (by C-G formula based on SCr of 1.4 mg/dL (H)). Liver Function Tests:  Recent Labs Lab 07/18/16 1524 07/22/16 0845  AST 21 25  ALT 11 15*  ALKPHOS 115 99  BILITOT 1.0 1.0  PROT 6.5 6.9  ALBUMIN 3.8 3.7   No results for input(s): LIPASE, AMYLASE in the last 168 hours. No results for input(s): AMMONIA in the last 168 hours. Coagulation Profile:  Recent Labs Lab 07/17/16 1047 07/22/16 1233 07/23/16 0628  INR 3.2 3.85 4.03*   Cardiac Enzymes:  Recent Labs Lab 07/22/16 0845  TROPONINI 0.04*   BNP (last 3 results) No results for input(s): PROBNP in the last 8760 hours. HbA1C: No results for input(s): HGBA1C in the last 72 hours. CBG: No results for input(s): GLUCAP in the last 168 hours. Lipid Profile: No results for input(s): CHOL, HDL, LDLCALC, TRIG, CHOLHDL, LDLDIRECT in the last 72 hours. Thyroid Function Tests: No results for input(s): TSH, T4TOTAL, FREET4, T3FREE, THYROIDAB in the last 72 hours. Anemia Panel: No results for input(s): VITAMINB12, FOLATE, FERRITIN, TIBC, IRON, RETICCTPCT in the last 72 hours. Sepsis Labs: No results for input(s): PROCALCITON, LATICACIDVEN in the last 168 hours.  Recent Results (from  the past 240 hour(s))  Urine culture     Status: Abnormal   Collection Time: 07/22/16  1:26 PM  Result Value Ref Range Status   Specimen Description URINE, RANDOM  Final   Special Requests NONE  Final   Culture MULTIPLE SPECIES PRESENT, SUGGEST RECOLLECTION (A)  Final   Report Status 07/23/2016 FINAL  Final         Radiology Studies: Dg Chest 2 View  Result Date: 07/22/2016 CLINICAL DATA:  Weakness, shortness of breath for 1 week, question pulmonary edema, history coronary artery disease, hypertension, stage III chronic kidney disease, valvular heart disease EXAM: CHEST  2 VIEW COMPARISON:  03/29/2012 FINDINGS: LEFT subclavian transvenous pacemaker leads project at RIGHT atrium and RIGHT ventricle, unchanged.  Enlargement of cardiac silhouette with pulmonary vascular congestion. Atherosclerotic calcification aorta. RIGHT pleural effusion and significant basilar atelectasis. Minimal LEFT basilar atelectasis. Underlying emphysematous changes. No definite pulmonary edema or pneumothorax. Bones diffusely demineralized. IMPRESSION: Enlargement of cardiac silhouette with pulmonary vascular congestion post pacemaker. COPD changes with RIGHT pleural effusion and significant RIGHT basilar atelectasis. Aortic atherosclerosis. Electronically Signed   By: Lavonia Dana M.D.   On: 07/22/2016 09:34        Scheduled Meds: . furosemide  80 mg Intravenous Q12H  . PHENobarbital  97.2 mg Oral QHS  . sertraline  100 mg Oral Daily  . sodium chloride flush  3 mL Intravenous Q12H  . Warfarin - Pharmacist Dosing Inpatient   Does not apply q1800   Continuous Infusions:    LOS: 0 days    Time spent: 44 minutes    THOMPSON,DANIEL, MD [facility] Pager [phone]  If 7PM-7AM, please contact night-coverage [email or web address] Password [email or web address] 07/23/2016, 12:00 PM

## 2016-07-23 NOTE — Progress Notes (Signed)
[facility]   Received request from [facility] of family interest in hospice services after discharge.  Attempted to visit pt in room however patient was busy with Phyiscal Therapy.  Wife not present and was unavailable by phone.  [facility] Liaison will follow up with family in the morning.  Thanks,  Gar Ponto, [facility] Liaison  [phone]

## 2016-07-23 NOTE — Progress Notes (Signed)
Responded to consult. Pt talked about his strong faith in [place] and [employer], including his desire to serve and not to stay past his time. Provided emotional/spiritual support and prayer. Pt also prayed for self and others Was called away when family member was arriving. Chaplain available for follow-up.    07/23/16 1100  Clinical Encounter Type  Visited With Patient  Visit Type Initial;Psychological support;Spiritual support;Social support  Referral From Nurse  Spiritual Encounters  Spiritual Needs Prayer;Emotional  Stress Factors  Patient Stress Factors Health changes

## 2016-07-23 NOTE — Care Management Obs Status (Signed)
[employer] NOTIFICATION   Patient Details  Name: KAWON WILLCUTT MRN: [id number] Date of Birth: March 31, 1927   Medicare Observation Status Notification Given:  Yes    Caylor Cerino, Rory Percy, RN 07/23/2016, 3:28 PM

## 2016-07-23 NOTE — Progress Notes (Signed)
Received Critical lab value INR 4.03, MD notified

## 2016-07-23 NOTE — Progress Notes (Signed)
Daily Progress Note   Patient Name: Eddie Park       Date: 07/23/2016 DOB: May 28, 1927  Age: 82 y.o. MRN#: [id number] Attending Physician: Eugenie Filler, MD Primary Care Physician: Gerrit Heck, MD Admit Date: 07/22/2016  Reason for Consultation/Follow-up: Disposition and Hospice Evaluation  Subjective: Patient reports feeling better today. His wife, Eddie Park, is at bedside. She expresses concern about his multiple falls and taking care of him at home alone. She has hired a Film/video editor to take over one day a week but feels she needs more help.  Eddie Park comments that Eddie Park did well over the summer. He was very stable. But since the beginning of October he has declined sharply with fluid overload and frequent falls. At this point he walks a minimal amount with a walker but has frequent falls. We discussed the discontinuation of Coumadin. I advised her to talk to Dr. Grandville Silos and the cardiologist about this.  We also discussed the connection between heart failure and kidney failure. Currently Eddie Park's creatinine is 1.4. Eddie Park understands that if he develops worsening kidney failure in association with heart failure it will likely mean end-of-life.  Eddie Park's 2-D echo is pending.  This may provide [place] a better understanding of how bad his heart failure is.  We discussed hospice and palliative care services. Eddie Park's daughter works for [employer].  I think the patient would most benefit from full hospice support including hospice aide, RN and possibly even volunteers to assist with ADLs.  At a minimum I believe Eddie Park should be enrolled in palliative care outpatient for symptom management and monitoring.  Length of Stay: 0  Current Medications: Scheduled Meds:  . furosemide  80 mg  Intravenous Q12H  . PHENobarbital  97.2 mg Oral QHS  . sertraline  100 mg Oral Daily  . sodium chloride flush  3 mL Intravenous Q12H  . Warfarin - Pharmacist Dosing Inpatient   Does not apply q1800    Continuous Infusions:    PRN Meds: sodium chloride, acetaminophen, ondansetron (ZOFRAN) IV, sodium chloride flush  Physical Exam       Thin pleasant elderly male.  HOH, alert and oriented, appropriate. CV rrr, no frank m/r/g, no JVD Resp cta no w/c/r, nad Abdomen soft, nt, min distended, active BS Lower Extremities with LEE bilaterally, able to move all 4 extremities  Vital Signs: BP (!) 114/42 (BP Location: Right Arm)   Pulse (!) 56   Temp 98.8 F (37.1 C) (Oral)   Resp 18   Ht 5\' 6"  (1.676 m)   Wt 60.1 kg (132 lb 6.4 oz)   SpO2 95%   BMI 21.37 kg/m  SpO2: SpO2: 95 % O2 Device: O2 Device: Not Delivered O2 Flow Rate: O2 Flow Rate (L/min): 1 L/min  Intake/output summary:  Intake/Output Summary (Last 24 hours) at 07/23/16 1453 Last data filed at 07/23/16 1357  Gross per 24 hour  Intake              460 ml  Output             2650 ml  Net            -2190 ml   LBM: Last BM Date: 07/19/16 Baseline Weight: Weight: 61.1 kg (134 lb 9.6 oz) Most recent weight: Weight: 60.1 kg (132 lb 6.4 oz)       Palliative Assessment/Data:    Flowsheet Rows   Flowsheet Row Most Recent Value  Intake Tab  Referral Department  Hospitalist  Unit at Time of Referral  Cardiac/Telemetry Unit  Palliative Care Primary Diagnosis  Cardiac  Date Notified  07/22/16  Palliative Care Type  New Palliative care  Reason for referral  Clarify Goals of Care, Counsel Regarding Hospice  Date of Admission  07/22/16  Date first seen by Palliative Care  07/22/16  # of days Palliative referral response time  0 Day(s)  # of days IP prior to Palliative referral  0  Clinical Assessment  Palliative Performance Scale Score  40%  Psychosocial & Spiritual Assessment  Palliative Care Outcomes    Patient/Family meeting held?  Yes  Who was at the meeting?  patient and wife (on phone)      Patient Active Problem List   Diagnosis Date Noted  . Anasarca 07/22/2016  . Chronic hyponatremia 07/22/2016  . FTT (failure to thrive) in adult 07/22/2016  . Anemia, chronic disease 07/22/2016  . CKD (chronic kidney disease) stage 3, GFR 30-59 ml/min 07/22/2016  . Palliative care encounter   . Goals of care, counseling/discussion   . Encounter for hospice care discussion   . Aortic insufficiency 07/18/2016  . Acute on chronic combined systolic and diastolic CHF, NYHA class 3 ([place]) 07/18/2016  . Coronary atherosclerosis of native coronary artery 01/25/2014  . Severe mitral regurgitation 01/25/2014  . Moderate aortic insufficiency 01/25/2014  . Encounter for therapeutic drug monitoring 10/19/2013  . Long term current use of anticoagulant therapy 07/21/2013  . [employer] 06/10/2012  . Permanent atrial fibrillation ([place]) 06/08/2012  . Tachycardia-bradycardia syndrome ([place]) 06/08/2012  . Hypertension 06/08/2012    Palliative Care Assessment & Plan   Patient Profile: 82 y.o. male  with past medical history of severe mitral valve regurgitation, tachy/brady syndrome s/p pacemaker placement, afib on coumadin, prostate cancer, seizure disorder, and CKD stage 3, who was admitted on 07/22/2016 with shortness of breath and increasing abdominal fluid.  He was recently evaluated outpatient by his cardiologist and started on torsemide which with not effective in relieving his symptoms. His BNP is elevated at 2423, but it appears to be chronically over 2000.  He was admitted today and placed on IV diuresis.   He has also been having frequent falls and now uses a walker.   Assessment: Receiving IV diuresis with improvement in breathing and symptoms. Steep decline over the past month. Frequent falls. Still eating  well.  Recommendations/Plan:  Home hospice services versus palliative care  outpatient with full home health services  Consider discontinuation of Coumadin at the discretion of [facility] MD. or cardiology  Palliative medicine will follow when necessary   Goals of Care and Additional Recommendations:  Limitations on Scope of Treatment: No Hemodialysis and No Surgical Procedures  Code Status:  DNR  Prognosis:   < 6 months secondary to advanced heart failure, chronic kidney disease, frequent falls, steep decline over the past 4 weeks  Discharge Planning:  Home with Hospice  Care plan was discussed with case manager, [facility] attending M.D.  Thank you for allowing the Palliative Medicine Team to assist in the care of this patient.   Time In: 1:00   Total Time 35 Prolonged Time Billed no      Greater than 50%  of this time was spent counseling and coordinating care related to the above assessment and plan.  Imogene Burn, PA-C Palliative Medicine   Please contact Palliative MedicineTeam phone at [phone] for questions and concerns.   Please see AMION for individual provider pager numbers.

## 2016-07-23 NOTE — Care Management Note (Signed)
Case Management Note  Patient Details  Name: Eddie Park MRN: [id number] Date of Birth: 1927/01/18  Subjective/Objective:     CM following for progression and d/c planning.                Action/Plan: 07/23/2016 Request placed for Hospice evaluation for Home Hospice services, as ordered by Karolee Stamps, NP. Referral to Hospice and [place]. Await eval and recommendation.   Expected Discharge Date:                  Expected Discharge Plan:  Home w Hospice Care  In-House Referral:     Discharge planning Services  CM Consult  Post Acute Care Choice:    Choice offered to:     DME Arranged:    DME Agency:     HH Arranged:    [place] Agency:     Status of Service:  In process, will continue to follow  If discussed at Long Length of Stay Meetings, dates discussed:    Additional Comments:  Adron Bene, RN 07/23/2016, 3:26 PM

## 2016-07-23 NOTE — Consult Note (Addendum)
Cardiology Consult    Patient ID: BUSH MURDOCH MRN: [id number], DOB/AGE: March 23, 1927   Admit date: 07/22/2016 Date of Consult: 07/23/2016  Primary Physician: Gerrit Heck, MD Reason for Consult: CHF Primary Cardiologist: Dr. Irish Lack Requesting Provider: Dr. Grandville Silos  Patient Profile    Eddie Park is a 82 y.o. male with history of CAD s/p MI 1995 (further details unknown), HTN, severe mitral valve regurgitation, mod-severe aortic regurgitaiton, permanent atrial fib on Coumadin, permanent atrial fib with tachy/brady syndrome (initial implant 2007, last gen change 2013 with BSX dual chamber PPM), prostate CA, seizure disorder, chronic combined CHF (EF 45-50% in 2014), CKD stage III (per labs), hyponatremia, chronic anemia. He presented to the ED on 07/22/16 with hypoxia and CHF exacerbation.   History of Present Illness   Mr. [name] was seen in our office on 07/18/16 and reported a weight gain of 10 pounds in the past 3-4 weeks, also has noticed more BLE edema and dyspnea on exertion. His lasix was changed to Torsemide 20mg  daily and he was told to take 30mg  if he did not have increased urine output.   He presented to the ED 4 days later with hypoxemia and massive volume overload. His BNP is 2423.5, and his last Echo (2014) showed EF 45-50%, severe MR, mod-severe AI, mod elevated RVSP, mod RAE, mod LAE, mild aortic root dilation, dilated IVC. Nuc 2010 was abnormal with mostly fixed inf wall defect c/w prior infarction, subtle peri-infarct ischemia, EF down to 40% - managed medically due to elevated Cr and subtle finding. PPM check 07/02/16 showed normal device function.  Palliative care has been consulted for goals of care. He is a DNR. He is extremely hard of hearing, but tells me that his breathing is much better and he denies chest pain.   Past Medical History   Past Medical History:  Diagnosis Date  . Anticoagulated on Coumadin   . Anxiety   . Aortic  insufficiency    a. mod-severe by echo 2014.  [name] Chronic anemia   . Chronic combined systolic and diastolic CHF (congestive heart failure) ([place])   . CKD (chronic kidney disease), stage III   . Coronary artery disease    a. s/p MI 1995.  [name] Hypertension   . Hyponatremia   . Mitral valve regurgitation   . Permanent atrial fibrillation ([place])   . Prostate cancer ([place]) 2002  . Seizure disorder ([place])   . Severe mitral regurgitation   . Tachycardia-bradycardia syndrome ([place])    a. w/ sycope - initial implant 2007, last gen change 2013 with BSX dual chamber PPM.    Past Surgical History:  Procedure Laterality Date  . artificial urinary sphincter    . PACEMAKER INSERTION  04/29/06   Generator change ([facility]) by Dr Rayann Heman 06/21/12  . PERMANENT PACEMAKER GENERATOR CHANGE N/A 06/08/2012   Procedure: PERMANENT PACEMAKER GENERATOR CHANGE;  Surgeon: Thompson Grayer, MD;  Location: [facility] CATH LAB;  Service: Cardiovascular;  Laterality: N/A;  . PROSTATE SURGERY       Allergies  No Known Allergies  Inpatient Medications    . furosemide  80 mg Intravenous Q12H  . PHENobarbital  97.2 mg Oral QHS  . sertraline  100 mg Oral Daily  . sodium chloride flush  3 mL Intravenous Q12H  . Warfarin - Pharmacist Dosing Inpatient   Does not apply q1800    Family History    Family History  Problem Relation Age of Onset  . Colon polyps Brother   .  Heart attack Father   . Heart attack Brother     Social History    Social History   Social History  . Marital status: Married    Spouse name: N/A  . Number of children: N/A  . Years of education: N/A   Occupational History  . Not on file.   Social History Main Topics  . Smoking status: Never Smoker  . Smokeless tobacco: Never Used  . Alcohol use No  . Drug use: No  . Sexual activity: Not on file   Other Topics Concern  . Not on file   Social History Narrative   Lives in [place]   Retired Furniture conservator/restorer     Review of Systems    General:  No chills,  fever, night sweats or weight changes.  Cardiovascular:  No chest pain, dyspnea on exertion, edema, orthopnea, palpitations, paroxysmal nocturnal dyspnea. Dermatological: No rash, lesions/masses Respiratory: No cough, dyspnea Urologic: No hematuria, dysuria Abdominal:   No nausea, vomiting, diarrhea, bright red blood per rectum, melena, or hematemesis Neurologic:  No visual changes, wkns, changes in mental status. All other systems reviewed and are otherwise negative except as noted above.  Physical Exam    Blood pressure (!) 114/42, pulse (!) 56, temperature 98.8 F (37.1 C), temperature source Oral, resp. rate 18, height 5\' 6"  (1.676 m), weight 132 lb 6.4 oz (60.1 kg), SpO2 95 %.  General: Pleasant, NAD Psych: Normal affect. Neuro: Alert and oriented X 3. Moves all extremities spontaneously. HEENT: Normal  Neck: Supple without bruits or JVD. Lungs:  Resp regular and unlabored, CTA. Heart: RRR no s3, s4, or murmurs. Abdomen: Soft, non-tender, non-distended, BS + x 4.  Extremities: No clubbing, cyanosis or edema. DP/PT/Radials 2+ and equal bilaterally.  Labs    Troponin (Point of Care Test) No results for input(s): TROPIPOC in the last 72 hours.  Recent Labs  07/22/16 0845  TROPONINI 0.04*   Lab Results  Component Value Date   WBC 5.0 07/23/2016   HGB 9.1 (L) 07/23/2016   HCT 27.0 (L) 07/23/2016   MCV 83.1 07/23/2016   PLT 124 (L) 07/23/2016    Recent Labs Lab 07/22/16 0845 07/23/16 0628  NA 126* 128*  K 3.9 3.6  CL 93* 93*  CO2 23 25  BUN 31* 28*  CREATININE 1.47* 1.40*  CALCIUM 9.0 8.4*  PROT 6.9  --   BILITOT 1.0  --   ALKPHOS 99  --   ALT 15*  --   AST 25  --   GLUCOSE 118* 93    Radiology Studies    Dg Chest 2 View  Result Date: 07/22/2016 CLINICAL DATA:  Weakness, shortness of breath for 1 week, question pulmonary edema, history coronary artery disease, hypertension, stage III chronic kidney disease, valvular heart disease EXAM: CHEST  2 VIEW  COMPARISON:  03/29/2012 FINDINGS: LEFT subclavian transvenous pacemaker leads project at RIGHT atrium and RIGHT ventricle, unchanged. Enlargement of cardiac silhouette with pulmonary vascular congestion. Atherosclerotic calcification aorta. RIGHT pleural effusion and significant basilar atelectasis. Minimal LEFT basilar atelectasis. Underlying emphysematous changes. No definite pulmonary edema or pneumothorax. Bones diffusely demineralized. IMPRESSION: Enlargement of cardiac silhouette with pulmonary vascular congestion post pacemaker. COPD changes with RIGHT pleural effusion and significant RIGHT basilar atelectasis. Aortic atherosclerosis. Electronically Signed   By: Lavonia Dana M.D.   On: 07/22/2016 09:34    EKG & Cardiac Imaging    EKG: A paced, RBBB  Echocardiogram: pending  Assessment & Plan    1. Acute on  chronic systolic CHF: Echo pending, last was in 2014 with EF of 45%. BNP markedly elevated, has gotten 2 doses of 80mg  IV Lasix. Would continue 80mg  tonight and tomorrow. Still volume overloaded on exam.   ACE-I was stopped due to declining renal function. At this point, I think diure   sis is more for symptom control and to make him comfortable.   2. History of Tachy-brady syndrome: s/p PPM.  3. Permanent Afib: On Coumadin, but now being held as he is supratherapeutic. If palliative approach is taken, would not restart Coumadin.   4. History of CAD s/p CABG.   Signed, Arbutus Leas, NP 07/23/2016, 2:38 PM Pager: [phone]   Patient seen and examined. Agree with assessment and plan. Mr. Eddie Park is an 82 year old male who has remote history of CAD and suffered a myocardial infarction in 1995.  He has a history of severe mitral valve regurgitation, moderately severe AR, hypertension, permanent atrial fibrillation on Coumadin anticoagulation, and he status post a permanent pacemaker for tachybradycardia syndrome.  He has had issues with chronic combined CHF.  An echo Doppler study in  2014 showed an EF of 45-50%.  There is a history of prostate CA, seizure disorder, chronic anemia, and he has stage III chronic kidney disease.  He has been followed by Dr. Irish Lack in our office.  He had developed increasing symptoms of volume overload and was seen in the office on 10/27 by Sharrell Ku due to progressive symptoms was admitted via the emergency room with hypoxia and CHF exacerbation.  He denies any recent episodes of chest pain.  His last nuclear study was in 2010 and showed fixed inferior wall defect consistent with prior infarction with very subtle peri-infarction ischemia.  He has a permanent pacemaker and his last device check on 07/02/2016 showed normal device function.  On exam, he is hard of hearing.  Jugular rate is controlled in the 60s.  His neck veins are increased.  There are decreased breath sounds bilaterally.  Rhythm was irregularly irregular with a controlled ventricular response with a 2/6 MR murmur at the apex.  Abdomen is mildly distended.  Bowel sounds are positive.  There is 1+ pitting edema bilaterally lower extremities.  Laboratories notable for significant BNP elevation at 2527 on 07/18/2016, and yesterday with admission was 2423.  Troponin is 0.04 is consistent with CHF mediated demand ischemia.  Serum creatinine is 1.4 today.  Previously had been on lisinopril 10 mg and this has been held.  Since admission, he is receive 2 doses of Lasix 80 mg IV and IV no since admission is -2690.  At present, will continue IV Lasix as prescribed.  I would also resume very low-dose ACE inhibitor addition with lisinopril at 2.5 mg with careful attenuate attention to his renal function.  If renal function worsens, we may need to switch to nitrates/hydralazine combination.  A follow-up 2-D echo Doppler study will be done to reassess LV function.  We will follow patient with you.  I had spoken to his son-in-law, Dr. Rhona Raider who is a psychiatrist, no longer in clinical practice.   Troy Sine, MD, [facility] 07/23/2016 3:48 PM

## 2016-07-23 NOTE — Progress Notes (Signed)
ANTICOAGULATION CONSULT NOTE - Initial Consult  Pharmacy Consult for warfarin Indication: atrial fibrillation  No Known Allergies  Patient Measurements: Height: 5\' 6"  (167.6 cm) Weight: 132 lb 6.4 oz (60.1 kg) IBW/kg (Calculated) : 63.8   Vital Signs: Temp: 98.8 F (37.1 C) (11/01 1000) Temp Source: Oral (11/01 1000) BP: 114/42 (11/01 1000) Pulse Rate: 56 (11/01 1000)  Labs:  Recent Labs  07/22/16 0845 07/22/16 1233 07/23/16 0628  HGB 10.0*  --  9.1*  HCT 29.8*  --  27.0*  PLT 114*  --  124*  LABPROT  --  38.8* 41.5*  INR  --  3.85 4.03*  CREATININE 1.47*  --  1.40*  TROPONINI 0.04*  --   --     Estimated Creatinine Clearance: 31.6 mL/min (by C-G formula based on SCr of 1.4 mg/dL (H)).   Assessment: 42 YOM admitted with SOB and abdominal swelling thought to be from HF exacerbation.  He is on warfarin PTA for AFib- INR on 10/26 was 3.2 and he was told to take 2.5mg  that evening and then resume normal dose of 7.5mg  daily except 10mg  on MWF. INR on admission elevated at 3.85.  INR today remains SUPRAtherapeutic despite holding yesterday's dose (INR 4.03 << 3.85, goal of 2-3). Hgb/Hct slight drop, plts 124. No overt s/sx of bleeding noted.   Goal of Therapy:  INR 2-3 Monitor platelets by anticoagulation protocol: Yes   Plan:  1. Hold Warfarin dose again today 2. Will continue to monitor for any signs/symptoms of bleeding and will follow up with PT/INR in the a.m.   Thank you for allowing pharmacy to be a part of this patient's care.  Alycia Rossetti, PharmD, BCPS Clinical Pharmacist Pager: [phone] Clinical phone for 07/23/2016 from 7a-3:30p: [phone] If after 3:30p, please call main pharmacy at: [phone] 07/23/2016 1:56 PM

## 2016-07-23 NOTE — Evaluation (Signed)
Physical Therapy Evaluation Patient Details Name: Eddie Park MRN: [id number] DOB: 1927/05/08 Today's Date: 07/23/2016   History of Present Illness  Eddie Park is a 82 y.o. male  with medical history significant for known chronic combined systolic and diastolic heart failure in the setting of mitral regurgitation and aortic insufficiency, chronic kidney disease stage III, hypertension, atrial fibrillation on Coumadin who presents with decompensated CHF and continued physical deconditinoing  Clinical Impression  Pt admitted with/for decompensated CHF.  Pt does not require much assist at this time, but if he doesn't have assist over and above the RW, he likely will fall as he has in the recent past..  Pt currently limited functionally due to the problems listed below.  (see problems list.)  Pt will benefit from PT to maximize function and safety to be able to get home safely with available assist.  If pt is going to continue on a decline, his wife will need extra assist or he will need SNF if she can't handle his assist needs.      Follow Up Recommendations Supervision/Assistance - 24 hour;Home health PT;Supervision for mobility/OOB (or SNF to build up more strength)    Equipment Recommendations  None recommended by PT    Recommendations for Other Services       Precautions / Restrictions Precautions Precautions: Fall      Mobility  Bed Mobility Overal bed mobility: Needs Assistance Bed Mobility: Supine to Sit     Supine to sit: Min assist     General bed mobility comments: needed assist to get up to EOB, pt scooted without assist  Transfers Overall transfer level: Needs assistance   Transfers: Sit to/from Stand Sit to Stand: Min assist         General transfer comment: stability assist, pt initially leaning posteriorly  Ambulation/Gait Ambulation/Gait assistance: Min assist;Min guard Ambulation Distance (Feet): 400 Feet Assistive device: Rolling walker (2  wheeled) Gait Pattern/deviations: Step-through pattern   Gait velocity interpretation: Below normal speed for age/gender General Gait Details: mostly steady, but when his focus is drawn away by staff or pt looks around he becomes unsteady and loses his balance.  Stairs            Wheelchair Mobility    Modified Rankin (Stroke Patients Only)       Balance Overall balance assessment: Needs assistance Sitting-balance support: No upper extremity supported Sitting balance-Leahy Scale: Fair     Standing balance support: Bilateral upper extremity supported Standing balance-Leahy Scale: Poor Standing balance comment: needs RW or some external support                             Pertinent Vitals/Pain Pain Assessment: No/denies pain    Home Living Family/patient expects to be discharged to:: Private residence Living Arrangements: Spouse/significant other Available Help at Discharge: Family;Available 24 hours/day Type of Home: House Home Access: Stairs to enter Entrance Stairs-Rails: Psychiatric nurse of Steps: 3 Home Layout: One level Home Equipment: Walker - 2 wheels;Bedside commode;Shower seat      Prior Function Level of Independence: Needs assistance   Gait / Transfers Assistance Needed: uses RW, but has fallen `30 times lately.  Focus is drawn away from walking and he falls.           Hand Dominance        Extremity/Trunk Assessment   Upper Extremity Assessment: Overall WFL for tasks assessed  Lower Extremity Assessment: Overall WFL for tasks assessed;Generalized weakness (more proximal weaknesses like hip flexors and trunk)         Communication   Communication: HOH  Cognition Arousal/Alertness: Awake/alert Behavior During Therapy: WFL for tasks assessed/performed Overall Cognitive Status: Within Functional Limits for tasks assessed                      General Comments      Exercises      Assessment/Plan    PT Assessment Patient needs continued PT services  PT Problem List Decreased strength;Decreased activity tolerance;Decreased balance;Decreased mobility;Decreased knowledge of use of DME          PT Treatment Interventions DME instruction;Gait training;Functional mobility training;Stair training;Therapeutic activities;Balance training;Patient/family education    PT Goals (Current goals can be found in the Care Plan section)  Acute Rehab PT Goals Patient Stated Goal: I want to feel better. PT Goal Formulation: With patient Time For Goal Achievement: 07/30/16 Potential to Achieve Goals: Good    Frequency Min 3X/week   Barriers to discharge        Co-evaluation               End of Session   Activity Tolerance: Patient tolerated treatment well Patient left: in chair;with call bell/phone within reach Nurse Communication: Mobility status    Functional Assessment Tool Used: clinical judgement Functional Limitation: Mobility: Walking and moving around Mobility: Walking and Moving Around Current Status (L8921): At least 1 percent but less than 20 percent impaired, limited or restricted Mobility: Walking and Moving Around Goal Status [phone]): At least 1 percent but less than 20 percent impaired, limited or restricted    Time: 4081-4481 PT Time Calculation (min) (ACUTE ONLY): 39 min   Charges:   PT Evaluation $PT Eval Low Complexity: 1 Procedure PT Treatments $Gait Training: 8-22 mins $Therapeutic Activity: 8-22 mins   PT G Codes:   PT G-Codes **NOT FOR INPATIENT CLASS** Functional Assessment Tool Used: clinical judgement Functional Limitation: Mobility: Walking and moving around Mobility: Walking and Moving Around Current Status (E5631): At least 1 percent but less than 20 percent impaired, limited or restricted Mobility: Walking and Moving Around Goal Status [phone]): At least 1 percent but less than 20 percent impaired, limited or restricted     Wai Litt, [name] 07/23/2016, 6:09 PM 07/23/2016  Donnella Sham, PT [phone]  (pager)

## 2016-07-24 ENCOUNTER — Ambulatory Visit: Payer: Medicare Other | Admitting: Cardiology

## 2016-07-24 ENCOUNTER — Inpatient Hospital Stay (HOSPITAL_COMMUNITY): Payer: Medicare Other

## 2016-07-24 DIAGNOSIS — Z7189 Other specified counseling: Secondary | ICD-10-CM

## 2016-07-24 DIAGNOSIS — I509 Heart failure, unspecified: Secondary | ICD-10-CM

## 2016-07-24 LAB — ECHOCARDIOGRAM COMPLETE
HEIGHTINCHES: 66 in
WEIGHTICAEL: 2123.47 [oz_av]

## 2016-07-24 LAB — BASIC METABOLIC PANEL
Anion gap: 11 (ref 5–15)
BUN: 31 mg/dL — ABNORMAL HIGH (ref 6–20)
CHLORIDE: 93 mmol/L — AB (ref 101–111)
CO2: 25 mmol/L (ref 22–32)
CREATININE: 1.31 mg/dL — AB (ref 0.61–1.24)
Calcium: 8.5 mg/dL — ABNORMAL LOW (ref 8.9–10.3)
GFR calc non Af Amer: 47 mL/min — ABNORMAL LOW (ref >60)
GFR, EST AFRICAN AMERICAN: 55 mL/min — AB (ref >60)
GLUCOSE: 101 mg/dL — AB (ref 65–99)
Potassium: 3.4 mmol/L — ABNORMAL LOW (ref 3.5–5.1)
Sodium: 129 mmol/L — ABNORMAL LOW (ref 135–145)

## 2016-07-24 LAB — CBC
HEMATOCRIT: 28.5 % — AB (ref 39.0–52.0)
HEMOGLOBIN: 9.5 g/dL — AB (ref 13.0–17.0)
MCH: 27.9 pg (ref 26.0–34.0)
MCHC: 33.3 g/dL (ref 30.0–36.0)
MCV: 83.8 fL (ref 78.0–100.0)
Platelets: 129 10*3/uL — ABNORMAL LOW (ref 150–400)
RBC: 3.4 MIL/uL — ABNORMAL LOW (ref 4.22–5.81)
RDW: 16.2 % — ABNORMAL HIGH (ref 11.5–15.5)
WBC: 5.1 10*3/uL (ref 4.0–10.5)

## 2016-07-24 LAB — PROTIME-INR
INR: 2.75
Prothrombin Time: 29.7 seconds — ABNORMAL HIGH (ref 11.4–15.2)

## 2016-07-24 MED ORDER — VITAMIN B-12 1000 MCG PO TABS
1000.0000 ug | ORAL_TABLET | Freq: Every day | ORAL | Status: DC
  Administered 2016-07-24 – 2016-07-28 (×5): 1000 ug via ORAL
  Filled 2016-07-24 (×5): qty 1

## 2016-07-24 MED ORDER — WARFARIN SODIUM 7.5 MG PO TABS
7.5000 mg | ORAL_TABLET | Freq: Once | ORAL | Status: AC
  Administered 2016-07-24: 7.5 mg via ORAL
  Filled 2016-07-24: qty 1

## 2016-07-24 MED ORDER — POTASSIUM CHLORIDE CRYS ER 20 MEQ PO TBCR
40.0000 meq | EXTENDED_RELEASE_TABLET | Freq: Every day | ORAL | Status: DC
  Administered 2016-07-24 – 2016-07-28 (×4): 40 meq via ORAL
  Filled 2016-07-24 (×5): qty 2

## 2016-07-24 NOTE — Progress Notes (Signed)
PROGRESS NOTE    Eddie Park  BTD:[id number] DOB: 1927/05/09 DOA: 07/22/2016 PCP: Gerrit Heck, MD    Brief Narrative:  Patient is a 82 year old gentleman with history of chronic combined systolic and diastolic heart failure in the setting of mitral regurgitation/aortic insufficiency, chronic kidney disease stage III, hypertension, atrial fibrillation on Coumadin, tachybradycardia syndrome status post PPM presented to the ED with decompensated heart failure. Palliative care and cardiology following.   Assessment & Plan:   Principal Problem:   Acute on chronic combined systolic and diastolic CHF, NYHA class 3 (HCC) Active Problems:   Permanent atrial fibrillation (HCC)   Hypertension   Severe mitral regurgitation   Moderate aortic insufficiency   Anasarca   Chronic hyponatremia   FTT (failure to thrive) in adult   Anemia, chronic disease   CKD (chronic kidney disease) stage 3, GFR 30-59 ml/min   Palliative care encounter   Goals of care, counseling/discussion   Encounter for hospice care discussion  #1 acute on chronic Systolic and diastolic heart failure/anasarca Patient failed outpatient treatment as was recently seen a cardiologist office and Lasix changed to [facility] for better absorption however patient presented to the ED with worsening shortness of breath and was subsequently admitted. Patient denied any chest pain. BNP on 07/23/2016, noted to be significantly elevated. Troponin minimally elevated at 0.04.2-D echo pending. Patient with 1.050 L urine output over the past 24 hours and is -2.2 L during this hospitalization. Coreg and lisinopril were held to better optimize diuresis. 2-D echo pending. Continue IV Lasix and low-dose ACE inhibitor. Due to patient's complex cardiac history cardiology has been consulted and appreciate input and recommendations.  #2 history of tachybradycardia syndrome/A. fib status post PPM Patient with a pacemaker. Per nursing patient  had heart rates in the 30s on 07/23/2016, however PPM was recently interrogated and doing fine. Patient has remained asymptomatic. Beta blocker on hold for better diuresis and renal function. 2-D echo pending. Cardiology consulted and appreciate input and recommendations. INR within therapeutic window of 2.75. supratherapeutic at 4. The palliative care PA patient with history of increased falls.?? Discontinue Coumadin however will defer to cardiology.  #3 history of coronary artery disease status post CABG Patient being treated for acute CHF exacerbation on IV diuretics. Beta blocker and ACE inhibitor were held in order to be able to diureses aggressively. Low dose ace inhibitor resumed per cardiology. Cardiology following.  #4 chronic kidney disease stage III Stable.  #5 chronic hyponatremia Likely secondary to hypervolemic hyponatremia secondary to CHF exacerbation. Monitor with IV diuretics.  #6 failure to thrive in adult Patient noted to have symptoms of failure to thrive. Patient currently DO NOT RESUSCITATE. Patient has been seen in consultation by palliative care were recommending that the minimal patient be discharged with palliative care following.  #7 anemia of chronic disease  H&H stable.  #8 history of prostate cancer Status post urinary sphincter ring.   #9 moderate aortic insufficiency/severe mitral regurgitation Per primary cardiologist note patient not a surgical candidate. Medical management.   DVT prophylaxis: therapeutic INR. Code Status: DO NOT RESUSCITATE Family Communication: Updated patient and wife at bedside. Disposition Plan: Pending medical workup and diuresis with either home with palliative care following.   Consultants:   Cardiology: Dr. Claiborne Billings 07/23/2016  Palliative care Dr. Rowe Pavy 07/22/2016  Procedures:   Chest x-ray 07/22/2016    Antimicrobials:  None   Subjective: Patient just returned from echocardiogram. Sitting up in bed eating  lunch. No chest pain. No shortness of breath.  Objective: Vitals:   07/24/16 0906 07/24/16 1100 07/24/16 1835 07/24/16 2016  BP: (!) 115/44 (!) 109/33 (!) 111/45 (!) 110/52  Pulse:  75 72 68  Resp:   18 19  Temp:  98 F (36.7 C) 98 F (36.7 C) 98.4 F (36.9 C)  TempSrc:  Oral Oral Oral  SpO2:  96% 98% 99%  Weight:    60.1 kg (132 lb 7.9 oz)  Height:        Intake/Output Summary (Last 24 hours) at 07/24/16 2023 Last data filed at 07/24/16 1700  Gross per 24 hour  Intake              840 ml  Output              750 ml  Net               90 ml   Filed Weights   07/22/16 2018 07/23/16 2027 07/24/16 2016  Weight: 60.1 kg (132 lb 6.4 oz) 60.2 kg (132 lb 11.5 oz) 60.1 kg (132 lb 7.9 oz)    Examination:  General exam: Appears calm and comfortable  Respiratory system: Improving Bibasilar crackles. Fair air movement. Respiratory effort normal. Cardiovascular system: S1 & S2 heard, RRR.+ JVD, murmurs, rubs, gallops or clicks. Trace to 1+ right lower extremity edema. Left lower extremity with trace edema.  Gastrointestinal system: Abdomen is nondistended, soft and nontender. No organomegaly or masses felt. Normal bowel sounds heard. Central nervous system: Alert and oriented. No focal neurological deficits. Extremities: Symmetric 5 x 5 power. Skin: Red rash noted on right posterior lower leg improving, nontender.  Psychiatry: Judgement and insight appear normal. Mood & affect appropriate.     Data Reviewed: I have personally reviewed following labs and imaging studies  CBC:  Recent Labs Lab 07/18/16 1524 07/22/16 0845 07/23/16 0628 07/24/16 0529  WBC 4.7 5.3 5.0 5.1  NEUTROABS  --  3.6  --   --   HGB 9.5* 10.0* 9.1* 9.5*  HCT 28.7* 29.8* 27.0* 28.5*  MCV 86.7 83.9 83.1 83.8  PLT 131* 114* 124* 846*   Basic Metabolic Panel:  Recent Labs Lab 07/18/16 1524 07/22/16 0845 07/23/16 0628 07/24/16 0529  NA 125* 126* 128* 129*  K 4.8 3.9 3.6 3.4*  CL 93* 93* 93* 93*    CO2 21 23 25 25   GLUCOSE 89 118* 93 101*  BUN 29* 31* 28* 31*  CREATININE 1.19* 1.47* 1.40* 1.31*  CALCIUM 8.8 9.0 8.4* 8.5*   GFR: Estimated Creatinine Clearance: 33.8 mL/min (by C-G formula based on SCr of 1.31 mg/dL (H)). Liver Function Tests:  Recent Labs Lab 07/18/16 1524 07/22/16 0845  AST 21 25  ALT 11 15*  ALKPHOS 115 99  BILITOT 1.0 1.0  PROT 6.5 6.9  ALBUMIN 3.8 3.7   No results for input(s): LIPASE, AMYLASE in the last 168 hours. No results for input(s): AMMONIA in the last 168 hours. Coagulation Profile:  Recent Labs Lab 07/22/16 1233 07/23/16 0628 07/24/16 0529  INR 3.85 4.03* 2.75   Cardiac Enzymes:  Recent Labs Lab 07/22/16 0845  TROPONINI 0.04*   BNP (last 3 results) No results for input(s): PROBNP in the last 8760 hours. HbA1C: No results for input(s): HGBA1C in the last 72 hours. CBG: No results for input(s): GLUCAP in the last 168 hours. Lipid Profile: No results for input(s): CHOL, HDL, LDLCALC, TRIG, CHOLHDL, LDLDIRECT in the last 72 hours. Thyroid Function Tests: No results for input(s): TSH, T4TOTAL, FREET4, T3FREE, THYROIDAB  in the last 72 hours. Anemia Panel: No results for input(s): VITAMINB12, FOLATE, FERRITIN, TIBC, IRON, RETICCTPCT in the last 72 hours. Sepsis Labs: No results for input(s): PROCALCITON, LATICACIDVEN in the last 168 hours.  Recent Results (from the past 240 hour(s))  Urine culture     Status: Abnormal   Collection Time: 07/22/16  1:26 PM  Result Value Ref Range Status   Specimen Description URINE, RANDOM  Final   Special Requests NONE  Final   Culture MULTIPLE SPECIES PRESENT, SUGGEST RECOLLECTION (A)  Final   Report Status 07/23/2016 FINAL  Final         Radiology Studies: No results found.      Scheduled Meds: . furosemide  80 mg Intravenous Q12H  . lisinopril  2.5 mg Oral Daily  . PHENobarbital  97.2 mg Oral QHS  . potassium chloride  40 mEq Oral Daily  . sertraline  100 mg Oral Daily  .  sodium chloride flush  3 mL Intravenous Q12H  . vitamin B-12  1,000 mcg Oral Daily  . Warfarin - Pharmacist Dosing Inpatient   Does not apply q1800   Continuous Infusions:    LOS: 1 day    Time spent: 44 minutes    THOMPSON,DANIEL, MD [facility] Pager [phone]  If 7PM-7AM, please contact night-coverage [email or web address] Password [facility] 07/24/2016, 8:23 PM

## 2016-07-24 NOTE — Progress Notes (Signed)
Patient Name: Eddie Park Date of Encounter: 07/24/2016  Primary Cardiologist: Dr. Marinda Elk Problem List     Principal Problem:   Acute on chronic combined systolic and diastolic CHF, NYHA class 3 (HCC) Active Problems:   Permanent atrial fibrillation (HCC)   Hypertension   Severe mitral regurgitation   Moderate aortic insufficiency   Anasarca   Chronic hyponatremia   FTT (failure to thrive) in adult   Anemia, chronic disease   CKD (chronic kidney disease) stage 3, GFR 30-59 ml/min   Palliative care encounter   Goals of care, counseling/discussion   Encounter for hospice care discussion     Subjective   Tired, lethargic. Denies chest pain, appears comfortable.   Inpatient Medications    Scheduled Meds: . furosemide  80 mg Intravenous Q12H  . lisinopril  2.5 mg Oral Daily  . PHENobarbital  97.2 mg Oral QHS  . potassium chloride  40 mEq Oral Daily  . sertraline  100 mg Oral Daily  . sodium chloride flush  3 mL Intravenous Q12H  . vitamin B-12  1,000 mcg Oral Daily  . warfarin  7.5 mg Oral ONCE-1800  . Warfarin - Pharmacist Dosing Inpatient   Does not apply q1800   Continuous Infusions:   PRN Meds: sodium chloride, acetaminophen, ondansetron (ZOFRAN) IV, sodium chloride flush   Vital Signs    Vitals:   07/23/16 2027 07/24/16 0433 07/24/16 0906 07/24/16 1100  BP: 125/74 (!) 108/48 (!) 115/44 (!) 109/33  Pulse: 62 98  75  Resp: 19 20    Temp: 98.5 F (36.9 C) 98.4 F (36.9 C)  98 F (36.7 C)  TempSrc: Oral Oral  Oral  SpO2: 99% 100%  96%  Weight: 132 lb 11.5 oz (60.2 kg)     Height:        Intake/Output Summary (Last 24 hours) at 07/24/16 1220 Last data filed at 07/24/16 0700  Gross per 24 hour  Intake              600 ml  Output             1050 ml  Net             -450 ml   Filed Weights   07/22/16 1700 07/22/16 2018 07/23/16 2027  Weight: 134 lb 9.6 oz (61.1 kg) 132 lb 6.4 oz (60.1 kg) 132 lb 11.5 oz (60.2 kg)    Physical  Exam   GEN: Well nourished, well developed, in no acute distress.  HEENT: Grossly normal.  Neck: Supple, no JVD, carotid bruits, or masses. Cardiac: RRR, 2/6 systolic murmur at the apex. no rubs, or gallops. No clubbing, cyanosis, edema.  Radials/DP/PT 2+ and equal bilaterally.  Respiratory:  Respirations regular and unlabored, clear to auscultation bilaterally. GI: Soft, nontender, nondistended, BS + x 4. MS: no deformity or atrophy. Skin: warm and dry, no rash. Neuro:  Strength and sensation are intact. Psych: AAOx3.  Normal affect.  Labs    CBC  Recent Labs  07/22/16 0845 07/23/16 0628 07/24/16 0529  WBC 5.3 5.0 5.1  NEUTROABS 3.6  --   --   HGB 10.0* 9.1* 9.5*  HCT 29.8* 27.0* 28.5*  MCV 83.9 83.1 83.8  PLT 114* 124* 409*   Basic Metabolic Panel  Recent Labs  07/23/16 0628 07/24/16 0529  NA 128* 129*  K 3.6 3.4*  CL 93* 93*  CO2 25 25  GLUCOSE 93 101*  BUN 28* 31*  CREATININE 1.40* 1.31*  CALCIUM 8.4* 8.5*   Liver Function Tests  Recent Labs  07/22/16 0845  AST 25  ALT 15*  ALKPHOS 99  BILITOT 1.0  PROT 6.9  ALBUMIN 3.7   Cardiac Enzymes  Recent Labs  07/22/16 0845  TROPONINI 0.04*   BNP (last 3 results)  Recent Labs  09/07/15 1334 07/18/16 1524 07/22/16 0845  BNP 2,098.3* 2,527.7* 2,423.5*    ProBNP (last 3 results) No results for input(s): PROBNP in the last 8760 hours.   Telemetry     - Personally Reviewed  ECG     - Personally Reviewed  Radiology    No results found.  Cardiac Studies   Echo pending  Patient Profile     Eddie Park a 82 y.o.malewith history of CAD s/p MI 1995 (further details unknown), HTN, severe mitral valve regurgitation, mod-severe aortic regurgitaiton, permanent atrial fib on Coumadin, permanent atrial fib withtachy/brady syndrome (initial implant 2007, last gen change 2013 with BSX dual chamber PPM), prostate CA, seizure disorder, chronic combined CHF (EF 45-50% in 2014), CKD stage  III (per labs), hyponatremia, chronic anemia. He presented to the ED on 07/22/16 with hypoxia and CHF exacerbation.   Assessment & Plan    1. Acute on chronic systolic CHF: Echo pending, last was in 2014 with EF of 45%.   Lisinopril was added back at 2.5mg , renal function is stable, actually improved from yesterday.   2. History of Tachy-brady syndrome: s/p PPM.  3. Permanent Afib: On Coumadin, but now being held as he is supratherapeutic. If palliative approach is taken, would not restart Coumadin.   4. History of CAD s/p CABG  Signed, Arbutus Leas, NP  07/24/2016, 12:20 PM   Patient seen and examined. Agree with assessment and plan. I/O -2940 since admission. Cr better at 1.31 with start of lisinopril 2.5 mg daily yesterday.  Will re-checkBMET  in am with BNP and if renal fxn stable then may titrate dose or add low dose spironolactone if LV is depressed.  Echo just done; results pending.    Troy Sine, MD, [facility] 07/24/2016 1:40 PM

## 2016-07-24 NOTE — Progress Notes (Signed)
OT Cancellation Note  Patient Details Name: OSAZE HUBBERT MRN: [id number] DOB: Nov 17, 1926   Cancelled Treatment:    Reason Eval/Treat Not Completed: Fatigue/lethargy limiting ability to participate (Pt also going to test. Will follow.)  Malka So 07/24/2016, 11:29 AM  [phone]

## 2016-07-24 NOTE — Discharge Instructions (Signed)

## 2016-07-24 NOTE — Progress Notes (Signed)
  Echocardiogram 2D Echocardiogram has been performed.  Jennette Dubin 07/24/2016, 12:48 PM

## 2016-07-24 NOTE — Progress Notes (Signed)
ANTICOAGULATION CONSULT NOTE - Initial Consult  Pharmacy Consult for warfarin Indication: atrial fibrillation  No Known Allergies  Patient Measurements: Height: 5\' 6"  (167.6 cm) Weight: 132 lb 11.5 oz (60.2 kg) IBW/kg (Calculated) : 63.8   Vital Signs: Temp: 98.4 F (36.9 C) (11/02 0433) Temp Source: Oral (11/02 0433) BP: 115/44 (11/02 0906) Pulse Rate: 98 (11/02 0433)  Labs:  Recent Labs  07/22/16 0845 07/22/16 1233 07/23/16 0628 07/24/16 0529  HGB 10.0*  --  9.1* 9.5*  HCT 29.8*  --  27.0* 28.5*  PLT 114*  --  124* 129*  LABPROT  --  38.8* 41.5* 29.7*  INR  --  3.85 4.03* 2.75  CREATININE 1.47*  --  1.40* 1.31*  TROPONINI 0.04*  --   --   --     Estimated Creatinine Clearance: 33.8 mL/min (by C-G formula based on SCr of 1.31 mg/dL (H)).   Assessment: 26 YOM admitted with SOB and abdominal swelling thought to be from HF exacerbation.  He is on warfarin PTA for AFib- INR on 10/26 was 3.2 and he was told to take 2.5mg  that evening and then resume normal dose of 7.5mg  daily except 10mg  on MWF. INR on admission elevated at 3.85.  INR today has trended down into the therapeutic after holding the dose for the past 2 days (INR 2.75 << 4.03, goal of 2-3). Hgb/Hct slight drop, plts 129. No overt s/sx of bleeding noted. The wife stated that the patient has had a good appetite while in the hospital (despite the po intake charted) and did eat a good breakfast this morning. The patient and wife were re-educated today. Will resume warfarin doses today.  *PTA dose of 7.5 mg daily EXCEPT for 10 mg on Mon/Wed/Fri  Goal of Therapy:  INR 2-3 Monitor platelets by anticoagulation protocol: Yes   Plan:  1. Warfarin 7.5 mg x 1 dose at 1800 today 2. Will continue to monitor for any signs/symptoms of bleeding and will follow up with PT/INR in the a.m.   Thank you for allowing pharmacy to be a part of this patient's care.  Alycia Rossetti, PharmD, BCPS Clinical Pharmacist Pager:  [phone] Clinical phone for 07/24/2016 from 7a-3:30p: [phone] If after 3:30p, please call main pharmacy at: [phone] 07/24/2016 10:40 AM

## 2016-07-24 NOTE — Progress Notes (Signed)
6E-06 [facility] RN Liaison Note   Received request from [facility], [facility] of family interest in hospice services at home after discharge.    Met with patient this morning to educate on hospice services.   Wife was not present.   After discussing with the patient I placed a call to the wife who stated that she was very aware of hospice services, however was told that the patient's condition has improved enough that they are now considering Palliative Care services versus Hospice.     Notified RNCM [employer] of family decision.   Please call [facility] with any questions.   The number to make a referral to Palliative Care is [phone].   Thank you for this referral. Please call if we can be of any further assistance in the care of this patient.   Mickie Kay, [facility] Liaison  [phone]

## 2016-07-25 LAB — BASIC METABOLIC PANEL
Anion gap: 10 (ref 5–15)
BUN: 28 mg/dL — ABNORMAL HIGH (ref 6–20)
CALCIUM: 8.3 mg/dL — AB (ref 8.9–10.3)
CHLORIDE: 92 mmol/L — AB (ref 101–111)
CO2: 27 mmol/L (ref 22–32)
CREATININE: 1.23 mg/dL (ref 0.61–1.24)
GFR calc non Af Amer: 51 mL/min — ABNORMAL LOW (ref >60)
GFR, EST AFRICAN AMERICAN: 59 mL/min — AB (ref >60)
Glucose, Bld: 86 mg/dL (ref 65–99)
Potassium: 3.6 mmol/L (ref 3.5–5.1)
SODIUM: 129 mmol/L — AB (ref 135–145)

## 2016-07-25 LAB — BRAIN NATRIURETIC PEPTIDE: B NATRIURETIC PEPTIDE 5: 1360.9 pg/mL — AB (ref 0.0–100.0)

## 2016-07-25 LAB — CBC
HCT: 26.2 % — ABNORMAL LOW (ref 39.0–52.0)
HEMOGLOBIN: 8.7 g/dL — AB (ref 13.0–17.0)
MCH: 27.7 pg (ref 26.0–34.0)
MCHC: 33.2 g/dL (ref 30.0–36.0)
MCV: 83.4 fL (ref 78.0–100.0)
Platelets: 140 10*3/uL — ABNORMAL LOW (ref 150–400)
RBC: 3.14 MIL/uL — ABNORMAL LOW (ref 4.22–5.81)
RDW: 16.3 % — AB (ref 11.5–15.5)
WBC: 4.9 10*3/uL (ref 4.0–10.5)

## 2016-07-25 LAB — PROTIME-INR
INR: 2.17
PROTHROMBIN TIME: 24.6 s — AB (ref 11.4–15.2)

## 2016-07-25 MED ORDER — SPIRONOLACTONE 25 MG PO TABS
12.5000 mg | ORAL_TABLET | Freq: Every day | ORAL | Status: DC
  Administered 2016-07-25 – 2016-07-28 (×4): 12.5 mg via ORAL
  Filled 2016-07-25 (×5): qty 1

## 2016-07-25 MED ORDER — WARFARIN SODIUM 10 MG PO TABS
10.0000 mg | ORAL_TABLET | Freq: Once | ORAL | Status: AC
  Administered 2016-07-25: 10 mg via ORAL
  Filled 2016-07-25: qty 1

## 2016-07-25 NOTE — Progress Notes (Signed)
Patient Name: Eddie Park Date of Encounter: 07/25/2016  Primary Cardiologist: Dr. Marinda Elk Problem List     Principal Problem:   Acute on chronic combined systolic and diastolic CHF, NYHA class 3 (HCC) Active Problems:   Permanent atrial fibrillation (HCC)   Hypertension   Severe mitral regurgitation   Moderate aortic insufficiency   Anasarca   Chronic hyponatremia   FTT (failure to thrive) in adult   Anemia, chronic disease   CKD (chronic kidney disease) stage 3, GFR 30-59 ml/min   Palliative care encounter   Goals of care, counseling/discussion   Encounter for hospice care discussion     Subjective   Feels great today, no chest pain or SOB.   Inpatient Medications    Scheduled Meds: . furosemide  80 mg Intravenous Q12H  . lisinopril  2.5 mg Oral Daily  . PHENobarbital  97.2 mg Oral QHS  . potassium chloride  40 mEq Oral Daily  . sertraline  100 mg Oral Daily  . sodium chloride flush  3 mL Intravenous Q12H  . vitamin B-12  1,000 mcg Oral Daily  . warfarin  10 mg Oral ONCE-1800  . Warfarin - Pharmacist Dosing Inpatient   Does not apply q1800   Continuous Infusions:   PRN Meds: sodium chloride, acetaminophen, ondansetron (ZOFRAN) IV, sodium chloride flush   Vital Signs    Vitals:   07/24/16 1835 07/24/16 2016 07/25/16 0559 07/25/16 1000  BP: (!) 111/45 (!) 110/52 (!) 113/58 115/64  Pulse: 72 68 73 62  Resp: 18 19 20 18   Temp: 98 F (36.7 C) 98.4 F (36.9 C) 98.7 F (37.1 C) 98 F (36.7 C)  TempSrc: Oral Oral Oral Oral  SpO2: 98% 99% 100% 100%  Weight:  132 lb 7.9 oz (60.1 kg)    Height:        Intake/Output Summary (Last 24 hours) at 07/25/16 1351 Last data filed at 07/25/16 0900  Gross per 24 hour  Intake              840 ml  Output             1350 ml  Net             -510 ml   Filed Weights   07/22/16 2018 07/23/16 2027 07/24/16 2016  Weight: 132 lb 6.4 oz (60.1 kg) 132 lb 11.5 oz (60.2 kg) 132 lb 7.9 oz (60.1 kg)     Physical Exam    GEN: Well nourished, well developed, in no acute distress.  HEENT: Grossly normal.  Neck: Supple, no JVD, carotid bruits, or masses. Cardiac: RRR, 2/6 systolic murmur. No rubs, or gallops. No clubbing, cyanosis, edema.  Radials/DP/PT 2+ and equal bilaterally.  Respiratory:  Respirations regular and unlabored, clear to auscultation bilaterally. GI: Soft, nontender, nondistended, BS + x 4. MS: no deformity or atrophy. Skin: warm and dry, no rash. Neuro:  Strength and sensation are intact. Psych: AAOx3.  Normal affect.  Labs    CBC  Recent Labs  07/24/16 0529 07/25/16 0323  WBC 5.1 4.9  HGB 9.5* 8.7*  HCT 28.5* 26.2*  MCV 83.8 83.4  PLT 129* 638*   Basic Metabolic Panel  Recent Labs  07/24/16 0529 07/25/16 0323  NA 129* 129*  K 3.4* 3.6  CL 93* 92*  CO2 25 27  GLUCOSE 101* 86  BUN 31* 28*  CREATININE 1.31* 1.23  CALCIUM 8.5* 8.3*   BNP (last 3 results)  Recent Labs  07/18/16 1524 07/22/16 0845 07/25/16 0323  BNP 2,527.7* 2,423.5* 1,360.9*    ProBNP (last 3 results) No results for input(s): PROBNP in the last 8760 hours.   Telemetry     A paced- Personally Reviewed  ECG    A paced, RBBB- Personally Reviewed  Radiology    No results found.  Cardiac Studies   Echocardiography Study Conclusions  - Left ventricle: The cavity size was normal. There was mild   concentric hypertrophy. Systolic function was mildly to   moderately reduced. The estimated ejection fraction was in the   range of 40% to 45%. Mild diffuse hypokinesis with no   identifiable regional variations. The study is not technically   sufficient to allow evaluation of LV diastolic function. - Ventricular septum: Septal motion showed paradox. These changes   are consistent with right ventricular pacing. - Aortic valve: There was moderate regurgitation directed centrally   in the LVOT. - Mitral valve: There was mild regurgitation directed centrally. - Left  atrium: The atrium was severely dilated. - Right ventricle: The cavity size was mildly dilated. - Tricuspid valve: There was moderate regurgitation. - Pulmonary arteries: Systolic pressure was mildly increased. PA   peak pressure: 39 mm Hg (S). - Pericardium, extracardiac: A small pericardial effusion was   identified posterior to the heart. The fluid had no internal   echoes.There was no evidence of hemodynamic compromise. There was   a left pleural effusion.   Patient Profile     Eddie Park a 82 y.o.malewith history of CAD s/p MI 1995 (further details unknown), HTN, severe mitral valve regurgitation, mod-severe aortic regurgitaiton, permanent atrial fib on Coumadin, permanent atrial fib withtachy/brady syndrome (initial implant 2007, last gen change 2013 with BSX dual chamber PPM), prostate CA, seizure disorder, chronic combined CHF (EF 45-50% in 2014), CKD stage III (per labs), hyponatremia, chronic anemia. He presented to the ED on 07/22/16 with hypoxia and CHF exacerbation.   Assessment & Plan  1. Acute on chronic systolic CHF: Echo shows EF of 40-45%, would add Spiro 12.5mg .   Renal function stable. BNP remains elevated, but he is -3.2L. MD to advise on diuresis.   2. History of Tachy-brady syndrome: s/p PPM.  3. Permanent Afib: On Coumadin, but now being held as he is supratherapeutic. If palliative approach is taken, would not restart Coumadin.   4. History of CAD s/p CABG    Signed, Arbutus Leas, NP  07/25/2016, 1:51 PM    Patient seen and examined. Agree with assessment and plan. Improved urine output with net I/O -3370. Cr continues to improve at 1.23 today. Breathing better; still with bilateral edema around ankles. EF 40 - 45%; moderate AR, mild MR, and moderate TR.  Will add spironolactone at 12.5 mg daily, recheck renal profiole in am.   Troy Sine, MD, [facility] 07/25/2016 2:36 PM

## 2016-07-25 NOTE — Progress Notes (Signed)
PROGRESS NOTE    Eddie Park  GBT:[id number] DOB: [birth year] DOA: 07/22/2016 PCP: Gerrit Heck, MD    Brief Narrative:  Patient is a 82 year old gentleman with history of chronic combined systolic and diastolic heart failure in the setting of mitral regurgitation/aortic insufficiency, chronic kidney disease stage III, hypertension, atrial fibrillation on Coumadin, tachybradycardia syndrome status post PPM presented to the ED with decompensated heart failure. Palliative care and cardiology following.   Assessment & Plan:   Principal Problem:   Acute on chronic combined systolic and diastolic CHF, NYHA class 3 (HCC) Active Problems:   Permanent atrial fibrillation (HCC)   Hypertension   Severe mitral regurgitation   Moderate aortic insufficiency   Anasarca   Chronic hyponatremia   FTT (failure to thrive) in adult   Anemia, chronic disease   CKD (chronic kidney disease) stage 3, GFR 30-59 ml/min   Palliative care encounter   Goals of care, counseling/discussion   Encounter for hospice care discussion  #1 acute on chronic Systolic and diastolic heart failure/anasarca Patient failed outpatient treatment as was recently seen a cardiologist office and Lasix changed to [facility] for better absorption however patient presented to the ED with worsening shortness of breath and was subsequently admitted. Patient denied any chest pain. BNP on 07/23/2016, noted to be significantly elevated. Troponin minimally elevated at 0.04.2-D echo with EF of 40-45%, moderate AR, mild MR, moderate TR. Patient with 1.10 L urine output over the past 24 hours and is -3.430 L during this hospitalization. Coreg and lisinopril were held to better optimize diuresis. Continue IV Lasix and low-dose ACE inhibitor. Low dose spironolactone added to patient's regimen per cardiology. Due to patient's complex cardiac history cardiology has been consulted and appreciate input and recommendations.  #2 history of  tachybradycardia syndrome/A. fib status post PPM Patient with a pacemaker. Per nursing patient had heart rates in the 30s on 07/23/2016, however PPM was recently interrogated and doing fine. Patient has remained asymptomatic. Beta blocker on hold for better diuresis and renal function. 2-D echo pending. Cardiology consulted and appreciate input and recommendations. INR within therapeutic window of 2.17 from 4.03. The palliative care PA patient with history of increased falls.?? Discontinue Coumadin however will defer to cardiology.  #3 history of coronary artery disease status post CABG Patient being treated for acute CHF exacerbation on IV diuretics. Beta blocker and ACE inhibitor were held in order to be able to diureses aggressively. Low dose ace inhibitor resumed per cardiology. Low-dose spironolactone also added to patient's regimen. 2-D echo with EF of 40-45%, moderate AR, mild MR, moderate TR. Cardiology following.  #4 chronic kidney disease stage III Stable. Creatinine improving with diuresis.  #5 chronic hyponatremia Likely secondary to hypervolemic hyponatremia secondary to CHF exacerbation. Improving with diuresis. Monitor with IV diuretics.  #6 failure to thrive in adult Patient noted to have symptoms of failure to thrive. Patient currently DO NOT RESUSCITATE. Patient has been seen in consultation by palliative care were recommending that the minimal patient be discharged with palliative care following.  #7 anemia of chronic disease  H&H stable.  #8 history of prostate cancer Status post urinary sphincter ring.   #9 moderate aortic insufficiency/severe mitral regurgitation Per primary cardiologist note patient not a surgical candidate. Medical management.   DVT prophylaxis: therapeutic INR. Code Status: DO NOT RESUSCITATE Family Communication: Updated patient and wife at bedside. Disposition Plan: Pending medical workup and diuresis with either home with palliative care  following.   Consultants:   Cardiology: Dr. Claiborne Billings 07/23/2016  Palliative care Dr. Rowe Pavy 07/22/2016  Procedures:   Chest x-ray 07/22/2016  2-D echo 07/24/2016  Antimicrobials:  None   Subjective: Patient sitting up in bed, about to eat lunch. No chest pain. No shortness of breath.   Objective: Vitals:   07/24/16 1835 07/24/16 2016 07/25/16 0559 07/25/16 1000  BP: (!) 111/45 (!) 110/52 (!) 113/58 115/64  Pulse: 72 68 73 62  Resp: 18 19 20 18   Temp: 98 F (36.7 C) 98.4 F (36.9 C) 98.7 F (37.1 C) 98 F (36.7 C)  TempSrc: Oral Oral Oral Oral  SpO2: 98% 99% 100% 100%  Weight:  60.1 kg (132 lb 7.9 oz)    Height:        Intake/Output Summary (Last 24 hours) at 07/25/16 1336 Last data filed at 07/25/16 0900  Gross per 24 hour  Intake              840 ml  Output             1350 ml  Net             -510 ml   Filed Weights   07/22/16 2018 07/23/16 2027 07/24/16 2016  Weight: 60.1 kg (132 lb 6.4 oz) 60.2 kg (132 lb 11.5 oz) 60.1 kg (132 lb 7.9 oz)    Examination:  General exam: Appears calm and comfortable  Respiratory system: CTAB. Fair air movement. Respiratory effort normal. Cardiovascular system: S1 & S2 heard, RRR.+ JVD, murmurs, rubs, gallops or clicks. Trace to 1+ right lower extremity edema. Left lower extremity with trace edema.  Gastrointestinal system: Abdomen is nondistended, soft and nontender. No organomegaly or masses felt. Normal bowel sounds heard. Central nervous system: Alert and oriented. No focal neurological deficits. Extremities: Symmetric 5 x 5 power. Skin: Red rash noted on right posterior lower leg improving daily, nontender.  Psychiatry: Judgement and insight appear normal. Mood & affect appropriate.     Data Reviewed: I have personally reviewed following labs and imaging studies  CBC:  Recent Labs Lab 07/18/16 1524 07/22/16 0845 07/23/16 0628 07/24/16 0529 07/25/16 0323  WBC 4.7 5.3 5.0 5.1 4.9  NEUTROABS  --  3.6  --   --    --   HGB 9.5* 10.0* 9.1* 9.5* 8.7*  HCT 28.7* 29.8* 27.0* 28.5* 26.2*  MCV 86.7 83.9 83.1 83.8 83.4  PLT 131* 114* 124* 129* 366*   Basic Metabolic Panel:  Recent Labs Lab 07/18/16 1524 07/22/16 0845 07/23/16 0628 07/24/16 0529 07/25/16 0323  NA 125* 126* 128* 129* 129*  K 4.8 3.9 3.6 3.4* 3.6  CL 93* 93* 93* 93* 92*  CO2 21 23 25 25 27   GLUCOSE 89 118* 93 101* 86  BUN 29* 31* 28* 31* 28*  CREATININE 1.19* 1.47* 1.40* 1.31* 1.23  CALCIUM 8.8 9.0 8.4* 8.5* 8.3*   GFR: Estimated Creatinine Clearance: 36 mL/min (by C-G formula based on SCr of 1.23 mg/dL). Liver Function Tests:  Recent Labs Lab 07/18/16 1524 07/22/16 0845  AST 21 25  ALT 11 15*  ALKPHOS 115 99  BILITOT 1.0 1.0  PROT 6.5 6.9  ALBUMIN 3.8 3.7   No results for input(s): LIPASE, AMYLASE in the last 168 hours. No results for input(s): AMMONIA in the last 168 hours. Coagulation Profile:  Recent Labs Lab 07/22/16 1233 07/23/16 0628 07/24/16 0529 07/25/16 0323  INR 3.85 4.03* 2.75 2.17   Cardiac Enzymes:  Recent Labs Lab 07/22/16 0845  TROPONINI 0.04*   BNP (last 3 results) No  results for input(s): PROBNP in the last 8760 hours. HbA1C: No results for input(s): HGBA1C in the last 72 hours. CBG: No results for input(s): GLUCAP in the last 168 hours. Lipid Profile: No results for input(s): CHOL, HDL, LDLCALC, TRIG, CHOLHDL, LDLDIRECT in the last 72 hours. Thyroid Function Tests: No results for input(s): TSH, T4TOTAL, FREET4, T3FREE, THYROIDAB in the last 72 hours. Anemia Panel: No results for input(s): VITAMINB12, FOLATE, FERRITIN, TIBC, IRON, RETICCTPCT in the last 72 hours. Sepsis Labs: No results for input(s): PROCALCITON, LATICACIDVEN in the last 168 hours.  Recent Results (from the past 240 hour(s))  Urine culture     Status: Abnormal   Collection Time: 07/22/16  1:26 PM  Result Value Ref Range Status   Specimen Description URINE, RANDOM  Final   Special Requests NONE  Final    Culture MULTIPLE SPECIES PRESENT, SUGGEST RECOLLECTION (A)  Final   Report Status 07/23/2016 FINAL  Final         Radiology Studies: No results found.      Scheduled Meds: . furosemide  80 mg Intravenous Q12H  . lisinopril  2.5 mg Oral Daily  . PHENobarbital  97.2 mg Oral QHS  . potassium chloride  40 mEq Oral Daily  . sertraline  100 mg Oral Daily  . sodium chloride flush  3 mL Intravenous Q12H  . vitamin B-12  1,000 mcg Oral Daily  . warfarin  10 mg Oral ONCE-1800  . Warfarin - Pharmacist Dosing Inpatient   Does not apply q1800   Continuous Infusions:    LOS: 2 days    Time spent: 76 minutes    Jalayah Gutridge, MD [facility] Pager [phone]  If 7PM-7AM, please contact night-coverage [email or web address] Password [email or web address] 07/25/2016, 1:36 PM

## 2016-07-25 NOTE — Progress Notes (Signed)
ANTICOAGULATION CONSULT NOTE - Follow-Up  Pharmacy Consult for warfarin Indication: atrial fibrillation  No Known Allergies  Patient Measurements: Height: 5\' 6"  (167.6 cm) Weight: 132 lb 7.9 oz (60.1 kg) IBW/kg (Calculated) : 63.8   Vital Signs: Temp: 98.7 F (37.1 C) (11/03 0559) Temp Source: Oral (11/03 0559) BP: 113/58 (11/03 0559) Pulse Rate: 73 (11/03 0559)  Labs:  Recent Labs  07/23/16 0628 07/24/16 0529 07/25/16 0323  HGB 9.1* 9.5* 8.7*  HCT 27.0* 28.5* 26.2*  PLT 124* 129* 140*  LABPROT 41.5* 29.7* 24.6*  INR 4.03* 2.75 2.17  CREATININE 1.40* 1.31* 1.23    Estimated Creatinine Clearance: 36 mL/min (by C-G formula based on SCr of 1.23 mg/dL).   Assessment: 24 YOM admitted with SOB and abdominal swelling thought to be from HF exacerbation.  He is on warfarin PTA for AFib- INR on 10/26 was 3.2 and he was told to take 2.5mg  that evening and then resume normal dose of 7.5mg  daily except 10mg  on MWF. INR on admission elevated at 3.85.  INR today remains therapeutic after restarting warfarin yesterday evening (INR 2.17 << 2.75, goal of 2-3). Hgb/Hct slight drop, plts 140. No overt s/sx of bleeding noted.   *PTA dose of 7.5 mg daily EXCEPT for 10 mg on Mon/Wed/Fri. The patient and wife were re-educated on warfarin this admission.  Goal of Therapy:  INR 2-3 Monitor platelets by anticoagulation protocol: Yes   Plan:  1. Warfarin 10 mg x 1 dose at 1800 today 2. Will continue to monitor for any signs/symptoms of bleeding and will follow up with PT/INR in the a.m.   Thank you for allowing pharmacy to be a part of this patient's care.  Alycia Rossetti, PharmD, BCPS Clinical Pharmacist Pager: [phone] Clinical phone for 07/25/2016 from 7a-3:30p: [phone] If after 3:30p, please call main pharmacy at: [phone] 07/25/2016 9:46 AM

## 2016-07-25 NOTE — Progress Notes (Signed)
Physical Therapy Treatment Patient Details Name: Eddie Park MRN: [id number] DOB: 1926/12/26 Today's Date: 07/25/2016    History of Present Illness Eddie Park is a 82 y.o. male  with medical history significant for known chronic combined systolic and diastolic heart failure in the setting of mitral regurgitation and aortic insufficiency, chronic kidney disease stage III, hypertension, atrial fibrillation on Coumadin who presents with decompensated CHF and continued physical deconditinoing    PT Comments    Progressing well with mobility in general.  Pt still not up enough during the day.  No SOB during gait for which pt commented that he was happy with.  Follow Up Recommendations  Supervision/Assistance - 24 hour;Home health PT;Supervision for mobility/OOB     Equipment Recommendations  None recommended by PT    Recommendations for Other Services       Precautions / Restrictions Precautions Precautions: Fall Restrictions Weight Bearing Restrictions: No    Mobility  Bed Mobility Overal bed mobility: Needs Assistance Bed Mobility: Supine to Sit     Supine to sit: Min assist     General bed mobility comments: assist to come up via right elbow, then support while he scooted to EOB  Transfers Overall transfer level: Needs assistance Equipment used: Rolling walker (2 wheeled) Transfers: Sit to/from Stand Sit to Stand: Min assist         General transfer comment: stability assist, pt initially leaning posteriorly  Ambulation/Gait Ambulation/Gait assistance: Min assist;Min guard Ambulation Distance (Feet): 400 Feet Assistive device: Rolling walker (2 wheeled) Gait Pattern/deviations: Step-through pattern Gait velocity: variable Gait velocity interpretation: Below normal speed for age/gender General Gait Details: generally steady with episodes of retropulsion when he looks up and a few occasions where is is unsteady.  Overall looking smooth and steady with  RW.   Stairs            Wheelchair Mobility    Modified Rankin (Stroke Patients Only)       Balance Overall balance assessment: Needs assistance Sitting-balance support: No upper extremity supported Sitting balance-Leahy Scale: Fair     Standing balance support: Bilateral upper extremity supported Standing balance-Leahy Scale: Poor                      Cognition Arousal/Alertness: Awake/alert;Lethargic Behavior During Therapy: WFL for tasks assessed/performed Overall Cognitive Status: Within Functional Limits for tasks assessed                      Exercises      General Comments        Pertinent Vitals/Pain Pain Assessment: No/denies pain    Home Living                      Prior Function            PT Goals (current goals can now be found in the care plan section) Acute Rehab PT Goals Patient Stated Goal: I want to feel better. PT Goal Formulation: With patient Time For Goal Achievement: 07/30/16 Potential to Achieve Goals: Good Progress towards PT goals: Progressing toward goals    Frequency    Min 3X/week      PT Plan Current plan remains appropriate    Co-evaluation             End of Session   Activity Tolerance: Patient tolerated treatment well Patient left: in bed;with call bell/phone within reach;with bed alarm set     Time:  2591-0289 PT Time Calculation (min) (ACUTE ONLY): 26 min  Charges:  $Gait Training: 8-22 mins $Therapeutic Activity: 8-22 mins                    G Codes:      Eddie Park, Eddie Park 07/25/2016, 10:46 AM

## 2016-07-25 NOTE — Care Management Note (Addendum)
Case Management Note  Patient Details  Name: Eddie Park MRN: [id number] Date of Birth: [birth year]  Subjective/Objective:                 Spoke with patient's wife Eddie Park over the phone. She would like to use [facility], referral made to [facility] clinical liaison for [facility] for PT OT RN HHA SW. Also left VM for [facility] for Palliative care after DC via [phone] with Eddie Park. Also faxed facesheet to [phone]. DC note note available at this time. Wife states that they have BSC, shower seat, and walker, declines additional needs.    Action/Plan:   Expected Discharge Date:                  Expected Discharge Plan:  [name]  In-House Referral:  NA  Discharge planning Services  CM Consult  Post Acute Care Choice:  Home Health (Palliative care) Choice offered to:  Patient, Spouse  DME Arranged:  N/A DME Agency:  NA  HH Arranged:  RN, PT, OT, Nurse's Aide, Social Work [employer] Agency:  [place]  Status of Service:  Completed, signed off  If discussed at [employer], dates discussed:    Additional Comments:  Eddie Collet, RN 07/25/2016, 12:43 PM

## 2016-07-26 DIAGNOSIS — D638 Anemia in other chronic diseases classified elsewhere: Secondary | ICD-10-CM

## 2016-07-26 DIAGNOSIS — I5023 Acute on chronic systolic (congestive) heart failure: Secondary | ICD-10-CM

## 2016-07-26 LAB — PROTIME-INR
INR: 1.93
Prothrombin Time: 22.3 seconds — ABNORMAL HIGH (ref 11.4–15.2)

## 2016-07-26 LAB — CBC
HEMATOCRIT: 27 % — AB (ref 39.0–52.0)
Hemoglobin: 8.8 g/dL — ABNORMAL LOW (ref 13.0–17.0)
MCH: 27.4 pg (ref 26.0–34.0)
MCHC: 32.6 g/dL (ref 30.0–36.0)
MCV: 84.1 fL (ref 78.0–100.0)
Platelets: 140 10*3/uL — ABNORMAL LOW (ref 150–400)
RBC: 3.21 MIL/uL — ABNORMAL LOW (ref 4.22–5.81)
RDW: 16.2 % — AB (ref 11.5–15.5)
WBC: 4.5 10*3/uL (ref 4.0–10.5)

## 2016-07-26 LAB — BASIC METABOLIC PANEL
Anion gap: 9 (ref 5–15)
BUN: 29 mg/dL — AB (ref 6–20)
CALCIUM: 8.7 mg/dL — AB (ref 8.9–10.3)
CO2: 28 mmol/L (ref 22–32)
CREATININE: 1.3 mg/dL — AB (ref 0.61–1.24)
Chloride: 92 mmol/L — ABNORMAL LOW (ref 101–111)
GFR calc Af Amer: 55 mL/min — ABNORMAL LOW (ref >60)
GFR calc non Af Amer: 48 mL/min — ABNORMAL LOW (ref >60)
GLUCOSE: 90 mg/dL (ref 65–99)
Potassium: 3.9 mmol/L (ref 3.5–5.1)
Sodium: 129 mmol/L — ABNORMAL LOW (ref 135–145)

## 2016-07-26 LAB — BRAIN NATRIURETIC PEPTIDE: B Natriuretic Peptide: 1450.6 pg/mL — ABNORMAL HIGH (ref 0.0–100.0)

## 2016-07-26 MED ORDER — TORSEMIDE 20 MG PO TABS
20.0000 mg | ORAL_TABLET | Freq: Every day | ORAL | Status: DC
  Administered 2016-07-27 – 2016-07-28 (×2): 20 mg via ORAL
  Filled 2016-07-26 (×3): qty 1

## 2016-07-26 NOTE — Progress Notes (Signed)
ANTICOAGULATION CONSULT NOTE - Follow-Up  Pharmacy Consult for warfarin Indication: atrial fibrillation  No Known Allergies  Patient Measurements: Height: 5\' 6"  (167.6 cm) Weight: 132 lb 11.5 oz (60.2 kg) IBW/kg (Calculated) : 63.8   Vital Signs: Temp: 98.2 F (36.8 C) (11/04 0931) Temp Source: Oral (11/04 0931) BP: 92/42 (11/04 1146) Pulse Rate: 67 (11/04 0931)  Labs:  Recent Labs  07/24/16 0529 07/25/16 0323 07/26/16 0635  HGB 9.5* 8.7* 8.8*  HCT 28.5* 26.2* 27.0*  PLT 129* 140* 140*  LABPROT 29.7* 24.6* 22.3*  INR 2.75 2.17 1.93  CREATININE 1.31* 1.23 1.30*    Estimated Creatinine Clearance: 34.1 mL/min (by C-G formula based on SCr of 1.3 mg/dL (H)).   Assessment: 31 YOM admitted with SOB and abdominal swelling thought to be from HF exacerbation.  He is on warfarin PTA for AFib- INR on 10/26 was 3.2 and he was told to take 2.5mg  that evening and then resume normal dose of 7.5mg  daily except 10mg  on MWF. INR on admission elevated at 3.85.  INR today remains therapeutic after restarting warfarin yesterday evening (INR 1.93<<2.17 << 2.75, goal of 2-3). Hgb/Hct slight drop, plts 140. No overt s/sx of bleeding noted.   *PTA dose of 7.5 mg daily EXCEPT for 10 mg on Mon/Wed/Fri. The patient and wife were re-educated on warfarin this admission.  Goal of Therapy:  INR 2-3 Monitor platelets by anticoagulation protocol: Yes   Plan:  1. Warfarin 10 mg x 1 dose at 1800 today 2. Will continue to monitor for any signs/symptoms of bleeding and will follow up with PT/INR in the a.m.   Thank you for allowing pharmacy to be a part of this patient's care. Anette Guarneri, PharmD [phone] 07/26/2016 12:12 PM

## 2016-07-26 NOTE — Progress Notes (Signed)
PROGRESS NOTE    Eddie Park  SAY:[id number] DOB: 1926/11/12 DOA: 07/22/2016 PCP: Gerrit Heck, MD    Brief Narrative:  Patient is a 82 year old gentleman with history of chronic combined systolic and diastolic heart failure in the setting of mitral regurgitation/aortic insufficiency, chronic kidney disease stage III, hypertension, atrial fibrillation on Coumadin, tachybradycardia syndrome status post PPM presented to the ED with decompensated heart failure. Palliative care and cardiology following.   Assessment & Plan:   Principal Problem:   Acute on chronic combined systolic and diastolic CHF, NYHA class 3 (HCC) Active Problems:   Permanent atrial fibrillation (HCC)   Hypertension   Severe mitral regurgitation   Moderate aortic insufficiency   Anasarca   Chronic hyponatremia   FTT (failure to thrive) in adult   Anemia, chronic disease   CKD (chronic kidney disease) stage 3, GFR 30-59 ml/min   Palliative care encounter   Goals of care, counseling/discussion   Encounter for hospice care discussion  #1 acute on chronic Systolic and diastolic heart failure/anasarca Patient failed outpatient treatment as was recently seen a cardiologist office and Lasix changed to [facility] for better absorption however patient presented to the ED with worsening shortness of breath and was subsequently admitted. Patient denied any chest pain. BNP on 07/23/2016, noted to be significantly elevated. Troponin minimally elevated at 0.04.2-D echo with EF of 40-45%, moderate AR, mild MR, moderate TR. Patient with 1.50 L urine output over the past 24 hours and is -3.930 L during this hospitalization. Coreg and lisinopril were held to better optimize diuresis. Continue low-dose ACE inhibitor and low dose spironolactone added to patient's regimen per cardiology. IV Lasix has been transitioned to oral Demadex to be started tomorrow per cardiology recommendations. Cardiology following and appreciate  input and recommendations.   #2 history of tachybradycardia syndrome/A. fib status post PPM Patient with a pacemaker. Per nursing patient had heart rates in the 30s on 07/23/2016, however PPM was recently interrogated and doing fine. Patient has remained asymptomatic. Beta blocker on hold for better diuresis and renal function. 2-D echo with EF of 40-45%, moderate AR, mild MR, moderate TR. Cardiology consulted and appreciate input and recommendations. INR within therapeutic window of 2.17 from 4.03. The palliative care PA patient with history of increased falls.?? Awaiting cardiology decision as to whether to continue patient's Coumadin however also noted that patient has been having increased falls at home.   #3 history of coronary artery disease status post CABG Patient being treated for acute CHF exacerbation on IV diuretics. Beta blocker and ACE inhibitor were held in order to be able to diureses aggressively. Low dose ace inhibitor resumed per cardiology. Low-dose spironolactone also added to patient's regimen. 2-D echo with EF of 40-45%, moderate AR, mild MR, moderate TR. Cardiology following.  #4 chronic kidney disease stage III Stable. Creatinine started to trend back up. IV Lasix changed to oral Lasix per cardiology.  #5 chronic hyponatremia Likely secondary to hypervolemic hyponatremia secondary to CHF exacerbation. Improving with diuresis. Monitor with IV diuretics.  #6 failure to thrive in adult Patient noted to have symptoms of failure to thrive. Patient currently DO NOT RESUSCITATE. Patient has been seen in consultation by palliative care were recommending that at the minimal patient be discharged with palliative care following.  #7 anemia of chronic disease  H&H stable.  #8 history of prostate cancer Status post urinary sphincter ring.   #9 moderate aortic insufficiency/severe mitral regurgitation Per primary cardiologist note patient not a surgical candidate. Medical  management.  DVT prophylaxis: therapeutic INR. Code Status: DO NOT RESUSCITATE Family Communication: Updated patient and wife at bedside. Disposition Plan: Pending medical workup and diuresis with either home with palliative care following.   Consultants:   Cardiology: Dr. Claiborne Billings 07/23/2016  Palliative care Dr. Rowe Pavy 07/22/2016  Procedures:   Chest x-ray 07/22/2016  2-D echo 07/24/2016  Antimicrobials:  None   Subjective: Patient sitting up in bed, about to eat lunch. No chest pain. No shortness of breath. Denies any shortness of breath. No chest pain. Feeling better. Wants to go home.  Objective: Vitals:   07/26/16 0931 07/26/16 1146 07/26/16 1258 07/26/16 1700  BP: (!) 94/32 (!) 92/42 (!) 111/35 (!) 117/46  Pulse: 67  (!) 54 65  Resp: 18   18  Temp: 98.2 F (36.8 C)   97.8 F (36.6 C)  TempSrc: Oral   Oral  SpO2: 98%   98%  Weight:      Height:        Intake/Output Summary (Last 24 hours) at 07/26/16 1855 Last data filed at 07/26/16 1743  Gross per 24 hour  Intake              600 ml  Output             1100 ml  Net             -500 ml   Filed Weights   07/24/16 2016 07/25/16 2020 07/26/16 0500  Weight: 60.1 kg (132 lb 7.9 oz) 60.2 kg (132 lb 11.5 oz) 60.2 kg (132 lb 11.5 oz)    Examination:  General exam: Appears calm and comfortable  Respiratory system: CTAB. Fair air movement. Respiratory effort normal. Cardiovascular system: S1 & S2 heard, RRR. No JVD, murmurs, rubs, gallops or clicks. Trace to 1+ right lower extremity edema. Left lower extremity with trace edema.  Gastrointestinal system: Abdomen is nondistended, soft and nontender. No organomegaly or masses felt. Normal bowel sounds heard. Central nervous system: Alert and oriented. No focal neurological deficits. Extremities: Symmetric 5 x 5 power. Skin: Red rash noted on right posterior lower leg improving daily, nontender.  Psychiatry: Judgement and insight appear normal. Mood & affect  appropriate.     Data Reviewed: I have personally reviewed following labs and imaging studies  CBC:  Recent Labs Lab 07/22/16 0845 07/23/16 0628 07/24/16 0529 07/25/16 0323 07/26/16 0635  WBC 5.3 5.0 5.1 4.9 4.5  NEUTROABS 3.6  --   --   --   --   HGB 10.0* 9.1* 9.5* 8.7* 8.8*  HCT 29.8* 27.0* 28.5* 26.2* 27.0*  MCV 83.9 83.1 83.8 83.4 84.1  PLT 114* 124* 129* 140* 423*   Basic Metabolic Panel:  Recent Labs Lab 07/22/16 0845 07/23/16 0628 07/24/16 0529 07/25/16 0323 07/26/16 0635  NA 126* 128* 129* 129* 129*  K 3.9 3.6 3.4* 3.6 3.9  CL 93* 93* 93* 92* 92*  CO2 23 25 25 27 28   GLUCOSE 118* 93 101* 86 90  BUN 31* 28* 31* 28* 29*  CREATININE 1.47* 1.40* 1.31* 1.23 1.30*  CALCIUM 9.0 8.4* 8.5* 8.3* 8.7*   GFR: Estimated Creatinine Clearance: 34.1 mL/min (by C-G formula based on SCr of 1.3 mg/dL (H)). Liver Function Tests:  Recent Labs Lab 07/22/16 0845  AST 25  ALT 15*  ALKPHOS 99  BILITOT 1.0  PROT 6.9  ALBUMIN 3.7   No results for input(s): LIPASE, AMYLASE in the last 168 hours. No results for input(s): AMMONIA in the last 168 hours. Coagulation  Profile:  Recent Labs Lab 07/22/16 1233 07/23/16 0628 07/24/16 0529 07/25/16 0323 07/26/16 0635  INR 3.85 4.03* 2.75 2.17 1.93   Cardiac Enzymes:  Recent Labs Lab 07/22/16 0845  TROPONINI 0.04*   BNP (last 3 results) No results for input(s): PROBNP in the last 8760 hours. HbA1C: No results for input(s): HGBA1C in the last 72 hours. CBG: No results for input(s): GLUCAP in the last 168 hours. Lipid Profile: No results for input(s): CHOL, HDL, LDLCALC, TRIG, CHOLHDL, LDLDIRECT in the last 72 hours. Thyroid Function Tests: No results for input(s): TSH, T4TOTAL, FREET4, T3FREE, THYROIDAB in the last 72 hours. Anemia Panel: No results for input(s): VITAMINB12, FOLATE, FERRITIN, TIBC, IRON, RETICCTPCT in the last 72 hours. Sepsis Labs: No results for input(s): PROCALCITON, LATICACIDVEN in the last  168 hours.  Recent Results (from the past 240 hour(s))  Urine culture     Status: Abnormal   Collection Time: 07/22/16  1:26 PM  Result Value Ref Range Status   Specimen Description URINE, RANDOM  Final   Special Requests NONE  Final   Culture MULTIPLE SPECIES PRESENT, SUGGEST RECOLLECTION (A)  Final   Report Status 07/23/2016 FINAL  Final         Radiology Studies: No results found.      Scheduled Meds: . lisinopril  2.5 mg Oral Daily  . PHENobarbital  97.2 mg Oral QHS  . potassium chloride  40 mEq Oral Daily  . sertraline  100 mg Oral Daily  . sodium chloride flush  3 mL Intravenous Q12H  . spironolactone  12.5 mg Oral Daily  . [START ON 07/27/2016] torsemide  20 mg Oral Daily  . vitamin B-12  1,000 mcg Oral Daily  . Warfarin - Pharmacist Dosing Inpatient   Does not apply q1800   Continuous Infusions:    LOS: 3 days    Time spent: 76 minutes    Laysha Childers, MD [facility] Pager [phone]  If 7PM-7AM, please contact night-coverage [email or web address] Password [facility] 07/26/2016, 6:55 PM

## 2016-07-26 NOTE — Progress Notes (Signed)
Patient B/P 92/42.  Paged Cardiology PA Ahmed Prima) @ 11:50, regarding the need to hold any cardiac meds.  Paged Internal Medicine MD [place] Silos) @ 12:10, regarding same issue. B/P @ 12:58 = 111/35.  Paged Cardiology MD [facility]) @ 13:24, regarding same issue.  Cardiology MD called back @ 13:26.  Order received to hold furosemide, potassium & lisinopril.  Jillyn Ledger, MBA, BSN, RN

## 2016-07-26 NOTE — Progress Notes (Signed)
Subjective:   No complaints this AM  Objective:   Temp:  [97.7 F (36.5 C)-98.5 F (36.9 C)] 98.5 F (36.9 C) (11/04 0513) Pulse Rate:  [61-71] 71 (11/04 0513) Resp:  [18-20] 20 (11/04 0513) BP: (109-116)/(37-64) 116/45 (11/04 0513) SpO2:  [100 %] 100 % (11/04 0513) Weight:  [132 lb 11.5 oz (60.2 kg)] 132 lb 11.5 oz (60.2 kg) (11/04 0500) Last BM Date: 07/25/16  Filed Weights   07/24/16 2016 07/25/16 2020 07/26/16 0500  Weight: 132 lb 7.9 oz (60.1 kg) 132 lb 11.5 oz (60.2 kg) 132 lb 11.5 oz (60.2 kg)    Intake/Output Summary (Last 24 hours) at 07/26/16 0838 Last data filed at 07/26/16 0605  Gross per 24 hour  Intake              840 ml  Output             1550 ml  Net             -710 ml    Telemetry: V-paced  Exam:  General: NAD  HEENT: sclera clear, throat clear  Resp: CTAB  Cardiac: RRR, no m/r/g, no jvd  GI: abdomen soft, NT,ND  MSK: no LE edema  Neuro: no focal deficits  Psych: appropriate affect  Lab Results:  Basic Metabolic Panel:  Recent Labs Lab 07/24/16 0529 07/25/16 0323 07/26/16 0635  NA 129* 129* 129*  K 3.4* 3.6 3.9  CL 93* 92* 92*  CO2 25 27 28   GLUCOSE 101* 86 90  BUN 31* 28* 29*  CREATININE 1.31* 1.23 1.30*  CALCIUM 8.5* 8.3* 8.7*    Liver Function Tests:  Recent Labs Lab 07/22/16 0845  AST 25  ALT 15*  ALKPHOS 99  BILITOT 1.0  PROT 6.9  ALBUMIN 3.7    CBC:  Recent Labs Lab 07/24/16 0529 07/25/16 0323 07/26/16 0635  WBC 5.1 4.9 4.5  HGB 9.5* 8.7* 8.8*  HCT 28.5* 26.2* 27.0*  MCV 83.8 83.4 84.1  PLT 129* 140* 140*    Cardiac Enzymes:  Recent Labs Lab 07/22/16 0845  TROPONINI 0.04*    BNP: No results for input(s): PROBNP in the last 8760 hours.  Coagulation:  Recent Labs Lab 07/24/16 0529 07/25/16 0323 07/26/16 0635  INR 2.75 2.17 1.93    ECG:   Medications:   Scheduled Medications: . furosemide  80 mg Intravenous Q12H  . lisinopril  2.5 mg Oral Daily  . PHENobarbital   97.2 mg Oral QHS  . potassium chloride  40 mEq Oral Daily  . sertraline  100 mg Oral Daily  . sodium chloride flush  3 mL Intravenous Q12H  . spironolactone  12.5 mg Oral Daily  . vitamin B-12  1,000 mcg Oral Daily  . Warfarin - Pharmacist Dosing Inpatient   Does not apply q1800     Infusions:     PRN Medications:  sodium chloride, acetaminophen, ondansetron (ZOFRAN) IV, sodium chloride flush     Assessment/Plan   1. Acute on chronic systolic HF - echo LVEF 91-47%, cannot eval diastolic function  - negative 758mL yesterday, negative 3.9 liters since admission. He is on lasix 80mg  IV bid, fairly stable renal function, does have some hyponatremia - medical therapy with lisionpril, aldactone. Beta blocker held due to soft bp's according to notes. Follow bp's and see if can restart this admit.  - appears euvolemic by exam. D/C IV lasix (he did receive his AM dose), restart home torsemide tomorrow.    2.  Tachy-brady - has ppm  3. Afib - on coumadin. Rates ok even off beta blocker        [name], M.D., F.A.C.C.Patient ID: Eddie Park, male   DOB: 1927/09/20, 82 y.o.   MRN: [id number]

## 2016-07-27 DIAGNOSIS — I5043 Acute on chronic combined systolic (congestive) and diastolic (congestive) heart failure: Secondary | ICD-10-CM

## 2016-07-27 LAB — BASIC METABOLIC PANEL
Anion gap: 9 (ref 5–15)
BUN: 27 mg/dL — ABNORMAL HIGH (ref 6–20)
CHLORIDE: 92 mmol/L — AB (ref 101–111)
CO2: 27 mmol/L (ref 22–32)
Calcium: 8.5 mg/dL — ABNORMAL LOW (ref 8.9–10.3)
Creatinine, Ser: 1.27 mg/dL — ABNORMAL HIGH (ref 0.61–1.24)
GFR calc non Af Amer: 49 mL/min — ABNORMAL LOW (ref >60)
GFR, EST AFRICAN AMERICAN: 57 mL/min — AB (ref >60)
Glucose, Bld: 86 mg/dL (ref 65–99)
POTASSIUM: 3.9 mmol/L (ref 3.5–5.1)
SODIUM: 128 mmol/L — AB (ref 135–145)

## 2016-07-27 LAB — CBC
HCT: 27.4 % — ABNORMAL LOW (ref 39.0–52.0)
HEMOGLOBIN: 9 g/dL — AB (ref 13.0–17.0)
MCH: 27.7 pg (ref 26.0–34.0)
MCHC: 32.8 g/dL (ref 30.0–36.0)
MCV: 84.3 fL (ref 78.0–100.0)
Platelets: 141 10*3/uL — ABNORMAL LOW (ref 150–400)
RBC: 3.25 MIL/uL — AB (ref 4.22–5.81)
RDW: 16.3 % — ABNORMAL HIGH (ref 11.5–15.5)
WBC: 4.2 10*3/uL (ref 4.0–10.5)

## 2016-07-27 LAB — PROTIME-INR
INR: 1.92
PROTHROMBIN TIME: 22.3 s — AB (ref 11.4–15.2)

## 2016-07-27 MED ORDER — WARFARIN SODIUM 10 MG PO TABS: 10.0000 mg | ORAL_TABLET | ORAL | Status: DC

## 2016-07-27 MED ORDER — WARFARIN SODIUM 10 MG PO TABS
10.0000 mg | ORAL_TABLET | Freq: Once | ORAL | Status: AC
  Administered 2016-07-27: 10 mg via ORAL
  Filled 2016-07-27: qty 1

## 2016-07-27 MED ORDER — WARFARIN SODIUM 7.5 MG PO TABS: 7.5000 mg | ORAL_TABLET | ORAL | Status: DC

## 2016-07-27 NOTE — Evaluation (Addendum)
Occupational Therapy Evaluation Patient Details Name: Eddie Park MRN: [id number] DOB: 1926/11/20 Today's Date: 07/27/2016    History of Present Illness Carl Butner Bialas is a 82 y.o. male  with medical history significant for known chronic combined systolic and diastolic heart failure in the setting of mitral regurgitation and aortic insufficiency, chronic kidney disease stage III, hypertension, atrial fibrillation on Coumadin who presents with decompensated CHF and continued physical deconditinoing   Clinical Impression   Pt admitted with above. Unsure of pt's PLOF in ADLs, Feel pt will benefit from acute OT to increase independence prior to d/c. Recommending HHOT and adequate 24/7 supervision/assistance at home.    Follow Up Recommendations  Home health OT;Supervision/Assistance - 24 hour    Equipment Recommendations  None recommended by OT    Recommendations for Other Services       Precautions / Restrictions Precautions Precautions: Fall Restrictions Weight Bearing Restrictions: No      Mobility Bed Mobility Overal bed mobility: Needs Assistance Bed Mobility: Supine to Sit;Sit to Supine     Supine to sit: Supervision Sit to supine: Min assist      Transfers Overall transfer level: Needs assistance Equipment used: Rolling walker (2 wheeled) Transfers: Sit to/from Stand Sit to Stand: Min guard              Balance    Min A for standing balance at sink during functional activities. Mod A for sitting balance sitting EOB during functional task.                                        ADL Overall ADL's : Needs assistance/impaired     Grooming: Oral care;Wash/dry hands;Minimal assistance;Standing               Lower Body Dressing: Moderate assistance;Sit to/from stand Lower Body Dressing Details (indicate cue type and reason): assist for sitting balance on EOB Toilet Transfer: Minimal assistance;Ambulation;RW            Functional mobility during ADLs: Minimal assistance;Rolling walker General ADL Comments: OT assisted pt in putting in his hearing aide.      Vision     Perception     Praxis      Pertinent Vitals/Pain Pain Assessment: Faces Faces Pain Scale: Hurts even more Pain Location: right hand, bottom and mouth Pain Descriptors / Indicators: Grimacing Pain Intervention(s): Monitored during session;Repositioned     Hand Dominance     Extremity/Trunk Assessment Upper Extremity Assessment Upper Extremity Assessment: Overall WFL for tasks assessed   Lower Extremity Assessment Lower Extremity Assessment: Defer to PT evaluation       Communication Communication Communication: HOH   Cognition Arousal/Alertness: Awake/alert Behavior During Therapy: WFL for tasks assessed/performed Overall Cognitive Status: Difficult to assess due to [place] Comments       Exercises       Shoulder Instructions      Home Living Family/patient expects to be discharged to:: Private residence Living Arrangements: Spouse/significant other Available Help at Discharge: Family;Available 24 hours/day Type of Home: House Home Access: Stairs to enter [employer] of Steps: 3 Entrance Stairs-Rails: Right;Left Home Layout: One level     Bathroom Shower/Tub: Corporate investment banker: Handicapped height Bathroom Accessibility: Yes   Home Equipment: Environmental consultant -  2 wheels;Bedside commode;Shower seat          Prior Functioning/Environment Level of Independence: Needs assistance  Gait / Transfers Assistance Needed: uses RW, but has fallen `30 times lately.  Focus is drawn away from walking and he falls. ADL's / Homemaking Assistance Needed: unsure of PLOF in ADLs            OT Problem List: Decreased strength;Decreased activity tolerance;Impaired balance (sitting and/or standing);Decreased knowledge of use of DME or AE;Decreased knowledge  of precautions;Pain   OT Treatment/Interventions: Self-care/ADL training;DME and/or AE instruction;Therapeutic exercise;Therapeutic activities;Patient/family education;Balance training    OT Goals(Current goals can be found in the care plan section) Acute Rehab OT Goals Patient Stated Goal: wants to get a bath OT Goal Formulation: With patient Time For Goal Achievement: 08/03/16 Potential to Achieve Goals: Good ADL Goals Pt Will Perform Grooming: with set-up;with supervision;standing Pt Will Perform Lower Body Bathing: with min guard assist;sit to/from stand Pt Will Perform Lower Body Dressing: with min guard assist;sit to/from stand Pt Will Transfer to Toilet: with min guard assist;ambulating (3 in 1 over commode) Pt Will Perform Toileting - Clothing Manipulation and hygiene: with min guard assist;sit to/from stand  OT Frequency: Min 2X/week   Barriers to D/C:            Co-evaluation              End of Session Equipment Utilized During Treatment: Gait belt;Rolling walker Nurse Communication: Other (comment) (wants bath; rinsed dentures in session)  Activity Tolerance: Patient tolerated treatment well Patient left: in bed;with bed alarm set;with call bell/phone within reach   Time: 6237-6283 OT Time Calculation (min): 18 min Charges:  OT General Charges $OT Visit: 1 Procedure OT Evaluation $OT Eval Moderate Complexity: 1 Procedure G-Codes:    Marquie Aderhold L OTR/L 07/27/2016, 9:57 AM

## 2016-07-27 NOTE — Progress Notes (Signed)
PROGRESS NOTE    DEMPSY Park  HUD:[id number] DOB: 01-Mar-1927 DOA: 07/22/2016 PCP: Gerrit Heck, MD    Brief Narrative:  Patient is a 82 year old gentleman with history of chronic combined systolic and diastolic heart failure in the setting of mitral regurgitation/aortic insufficiency, chronic kidney disease stage III, hypertension, atrial fibrillation on Coumadin, tachybradycardia syndrome status post PPM presented to the ED with decompensated heart failure. Palliative care and cardiology following.   Assessment & Plan:   Principal Problem:   Acute on chronic combined systolic and diastolic CHF, NYHA class 3 (HCC) Active Problems:   Permanent atrial fibrillation (HCC)   Hypertension   Severe mitral regurgitation   Moderate aortic insufficiency   Anasarca   Chronic hyponatremia   FTT (failure to thrive) in adult   Anemia, chronic disease   CKD (chronic kidney disease) stage 3, GFR 30-59 ml/min   Palliative care encounter   Goals of care, counseling/discussion   Encounter for hospice care discussion  #1 acute on chronic Systolic and diastolic heart failure/anasarca Patient failed outpatient treatment as was recently seen a cardiologist office and Lasix changed to [facility] for better absorption however patient presented to the ED with worsening shortness of breath and was subsequently admitted. Patient denied any chest pain. BNP on 07/23/2016, noted to be significantly elevated. Troponin minimally elevated at 0.04.2-D echo with EF of 40-45%, moderate AR, mild MR, moderate TR. Patient with 1.10 L urine output over the past 24 hours and is -3.810 L during this hospitalization. Coreg and lisinopril were held to better optimize diuresis. Continue low-dose ACE inhibitor and low dose spironolactone added to patient's regimen per cardiology. IV Lasix has been transitioned to oral Demadex per cardiology recommendations. Cardiology following and appreciate input and  recommendations.   #2 history of tachybradycardia syndrome/A. fib status post PPM Patient with a pacemaker. Per nursing patient had heart rates in the 30s on 07/23/2016, however PPM was recently interrogated and doing fine. Patient has remained asymptomatic. Beta blocker on hold for better diuresis and renal function. 2-D echo with EF of 40-45%, moderate AR, mild MR, moderate TR. Cardiology consulted and appreciate input and recommendations. INR now minimally subtherapeutic at 1.92 from 2.17 from 4.03. Per palliative care PA patient with history of increased falls.?? Awaiting cardiology decision as to whether to continue patient's Coumadin however also noted that patient has been having increased falls at home.   #3 history of coronary artery disease status post CABG Patient being treated for acute CHF exacerbation on IV diuretics. Beta blocker and ACE inhibitor were held in order to be able to diureses aggressively. Low dose ace inhibitor resumed per cardiology. Low-dose spironolactone also added to patient's regimen. 2-D echo with EF of 40-45%, moderate AR, mild MR, moderate TR. Cardiology following.  #4 chronic kidney disease stage III Stable. Creatinine started to trend back up. IV Lasix changed to oral Lasix per cardiology.  #5 chronic hyponatremia Likely secondary to hypervolemic hyponatremia secondary to CHF exacerbation. Improving with diuresis.   #6 failure to thrive in adult Patient noted to have symptoms of failure to thrive. Patient currently DO NOT RESUSCITATE. Patient has been seen in consultation by palliative care were recommending that at the minimal patient be discharged with palliative care following.  #7 anemia of chronic disease  H&H stable.  #8 history of prostate cancer Status post urinary sphincter ring.   #9 moderate aortic insufficiency/severe mitral regurgitation Per primary cardiologist note patient not a surgical candidate. Medical management.   DVT  prophylaxis: INR  1.92.  Code Status: DO NOT RESUSCITATE Family Communication: No family at bedside. Disposition Plan: Hopefully home in the next 24-48 hours and per cardiology. Likely home with palliative care following.    Consultants:   Cardiology: Dr. Claiborne Billings 07/23/2016  Palliative care Dr. Rowe Pavy 07/22/2016  Procedures:   Chest x-ray 07/22/2016  2-D echo 07/24/2016  Antimicrobials:  None   Subjective: Patient sleeping comfortably.   Objective: Vitals:   07/27/16 0137 07/27/16 0503 07/27/16 0900 07/27/16 1158  BP:  (!) 114/36 (!) 100/31 (!) 122/51  Pulse:  66 63 (!) 59  Resp:  18 18   Temp:  98.2 F (36.8 C) 98.3 F (36.8 C)   TempSrc:  Oral Oral   SpO2:  92% 95%   Weight: 60.4 kg (133 lb 2.5 oz)     Height:        Intake/Output Summary (Last 24 hours) at 07/27/16 1414 Last data filed at 07/27/16 0900  Gross per 24 hour  Intake              720 ml  Output             1100 ml  Net             -380 ml   Filed Weights   07/26/16 0500 07/26/16 2048 07/27/16 0137  Weight: 60.2 kg (132 lb 11.5 oz) 60.4 kg (133 lb 2.5 oz) 60.4 kg (133 lb 2.5 oz)    Examination:  General exam: Appears calm and comfortable  Respiratory system: CTAB Anterior lung fields. Fair air movement. Respiratory effort normal. Cardiovascular system: S1 & S2 heard, RRR. No JVD, murmurs, rubs, gallops or clicks. Trace to 1+ right lower extremity edema. Left lower extremity with trace edema.  Gastrointestinal system: Abdomen is nondistended, soft and nontender. No organomegaly or masses felt. Normal bowel sounds heard. Central nervous system: Alert and oriented. No focal neurological deficits. Extremities: Symmetric 5 x 5 power. Skin: Red rash noted on right posterior lower leg improving daily, nontender.  Psychiatry: Judgement and insight appear normal. Mood & affect appropriate.     Data Reviewed: I have personally reviewed following labs and imaging studies  CBC:  Recent Labs Lab  07/22/16 0845 07/23/16 0628 07/24/16 0529 07/25/16 0323 07/26/16 0635 07/27/16 0526  WBC 5.3 5.0 5.1 4.9 4.5 4.2  NEUTROABS 3.6  --   --   --   --   --   HGB 10.0* 9.1* 9.5* 8.7* 8.8* 9.0*  HCT 29.8* 27.0* 28.5* 26.2* 27.0* 27.4*  MCV 83.9 83.1 83.8 83.4 84.1 84.3  PLT 114* 124* 129* 140* 140* 834*   Basic Metabolic Panel:  Recent Labs Lab 07/23/16 0628 07/24/16 0529 07/25/16 0323 07/26/16 0635 07/27/16 0526  NA 128* 129* 129* 129* 128*  K 3.6 3.4* 3.6 3.9 3.9  CL 93* 93* 92* 92* 92*  CO2 25 25 27 28 27   GLUCOSE 93 101* 86 90 86  BUN 28* 31* 28* 29* 27*  CREATININE 1.40* 1.31* 1.23 1.30* 1.27*  CALCIUM 8.4* 8.5* 8.3* 8.7* 8.5*   GFR: Estimated Creatinine Clearance: 35 mL/min (by C-G formula based on SCr of 1.27 mg/dL (H)). Liver Function Tests:  Recent Labs Lab 07/22/16 0845  AST 25  ALT 15*  ALKPHOS 99  BILITOT 1.0  PROT 6.9  ALBUMIN 3.7   No results for input(s): LIPASE, AMYLASE in the last 168 hours. No results for input(s): AMMONIA in the last 168 hours. Coagulation Profile:  Recent Labs Lab 07/23/16 0628 07/24/16  0370 07/25/16 0323 07/26/16 0635 07/27/16 0526  INR 4.03* 2.75 2.17 1.93 1.92   Cardiac Enzymes:  Recent Labs Lab 07/22/16 0845  TROPONINI 0.04*   BNP (last 3 results) No results for input(s): PROBNP in the last 8760 hours. HbA1C: No results for input(s): HGBA1C in the last 72 hours. CBG: No results for input(s): GLUCAP in the last 168 hours. Lipid Profile: No results for input(s): CHOL, HDL, LDLCALC, TRIG, CHOLHDL, LDLDIRECT in the last 72 hours. Thyroid Function Tests: No results for input(s): TSH, T4TOTAL, FREET4, T3FREE, THYROIDAB in the last 72 hours. Anemia Panel: No results for input(s): VITAMINB12, FOLATE, FERRITIN, TIBC, IRON, RETICCTPCT in the last 72 hours. Sepsis Labs: No results for input(s): PROCALCITON, LATICACIDVEN in the last 168 hours.  Recent Results (from the past 240 hour(s))  Urine culture      Status: Abnormal   Collection Time: 07/22/16  1:26 PM  Result Value Ref Range Status   Specimen Description URINE, RANDOM  Final   Special Requests NONE  Final   Culture MULTIPLE SPECIES PRESENT, SUGGEST RECOLLECTION (A)  Final   Report Status 07/23/2016 FINAL  Final         Radiology Studies: No results found.      Scheduled Meds: . lisinopril  2.5 mg Oral Daily  . PHENobarbital  97.2 mg Oral QHS  . potassium chloride  40 mEq Oral Daily  . sertraline  100 mg Oral Daily  . sodium chloride flush  3 mL Intravenous Q12H  . spironolactone  12.5 mg Oral Daily  . torsemide  20 mg Oral Daily  . vitamin B-12  1,000 mcg Oral Daily  . warfarin  10 mg Oral ONCE-1800  . [START ON 07/28/2016] warfarin  10 mg Oral Once per day on Mon Fri  . [START ON 07/28/2016] warfarin  7.5 mg Oral Once per day on Sun Tue Wed Thu Sat  . Warfarin - Pharmacist Dosing Inpatient   Does not apply q1800   Continuous Infusions:   LOS: 4 days    Time spent: 60 minutes    Acey Woodfield, MD [facility] Pager [phone]  If 7PM-7AM, please contact night-coverage [email or web address] Password [email or web address] 07/27/2016, 2:14 PM

## 2016-07-27 NOTE — Progress Notes (Signed)
ANTICOAGULATION CONSULT NOTE - Follow-Up  Pharmacy Consult for warfarin Indication: atrial fibrillation  No Known Allergies  Patient Measurements: Height: 5\' 6"  (167.6 cm) Weight: 133 lb 2.5 oz (60.4 kg) IBW/kg (Calculated) : 63.8   Vital Signs: Temp: 98.3 F (36.8 C) (11/05 0900) Temp Source: Oral (11/05 0900) BP: 100/31 (11/05 0900) Pulse Rate: 63 (11/05 0900)  Labs:  Recent Labs  07/25/16 0323 07/26/16 0635 07/27/16 0526  HGB 8.7* 8.8* 9.0*  HCT 26.2* 27.0* 27.4*  PLT 140* 140* 141*  LABPROT 24.6* 22.3* 22.3*  INR 2.17 1.93 1.92  CREATININE 1.23 1.30* 1.27*    Estimated Creatinine Clearance: 35 mL/min (by C-G formula based on SCr of 1.27 mg/dL (H)).   Assessment: 47 YOM admitted with SOB and abdominal swelling thought to be from HF exacerbation.  He is on warfarin PTA for AFib- INR on 10/26 was 3.2 and he was told to take 2.5mg  that evening and then resume normal dose of 7.5mg  daily except 10mg  on MWF. INR on admission elevated at 3.85.  INR today = 1.92, slightly below 2  *PTA dose of 7.5 mg daily EXCEPT for 10 mg on Mon/Wed/Fri. The patient and wife were re-educated on warfarin this admission.  Goal of Therapy:  INR 2-3 Monitor platelets by anticoagulation protocol: Yes   Plan:  Warfarin 10 mg po x 1 dose tonight Discharge home with 10 mg MF, 7.5 mg other days INR Tuesday or Wednesday of next week  Thank you Anette Guarneri, PharmD [phone] 07/27/2016 11:35 AM

## 2016-07-27 NOTE — Progress Notes (Signed)
Subjective:   No SOB this AM  Objective:   Temp:  [97.8 F (36.6 C)-98.2 F (36.8 C)] 98.2 F (36.8 C) (11/05 0503) Pulse Rate:  [53-67] 66 (11/05 0503) Resp:  [18] 18 (11/05 0503) BP: (92-117)/(32-46) 114/36 (11/05 0503) SpO2:  [92 %-98 %] 92 % (11/05 0503) Weight:  [133 lb 2.5 oz (60.4 kg)] 133 lb 2.5 oz (60.4 kg) (11/05 0137) Last BM Date: 07/26/16  Filed Weights   07/26/16 0500 07/26/16 2048 07/27/16 0137  Weight: 132 lb 11.5 oz (60.2 kg) 133 lb 2.5 oz (60.4 kg) 133 lb 2.5 oz (60.4 kg)    Intake/Output Summary (Last 24 hours) at 07/27/16 0728 Last data filed at 07/27/16 0504  Gross per 24 hour  Intake              960 ml  Output             1100 ml  Net             -140 ml    Telemetry: V-paced  Exam:  General: NAD  HEENT: sclera clear, throat clear  Resp: CTAB  Cardiac: RRR, no m/r/g, no jvd  GI: abdomen soft, NT,ND  MSK: no LE edema  Neuro: no focal deficits  Psych: appropriate affect  Lab Results:  Basic Metabolic Panel:  Recent Labs Lab 07/25/16 0323 07/26/16 0635 07/27/16 0526  NA 129* 129* 128*  K 3.6 3.9 3.9  CL 92* 92* 92*  CO2 27 28 27   GLUCOSE 86 90 86  BUN 28* 29* 27*  CREATININE 1.23 1.30* 1.27*  CALCIUM 8.3* 8.7* 8.5*    Liver Function Tests:  Recent Labs Lab 07/22/16 0845  AST 25  ALT 15*  ALKPHOS 99  BILITOT 1.0  PROT 6.9  ALBUMIN 3.7    CBC:  Recent Labs Lab 07/25/16 0323 07/26/16 0635 07/27/16 0526  WBC 4.9 4.5 4.2  HGB 8.7* 8.8* 9.0*  HCT 26.2* 27.0* 27.4*  MCV 83.4 84.1 84.3  PLT 140* 140* 141*    Cardiac Enzymes:  Recent Labs Lab 07/22/16 0845  TROPONINI 0.04*    BNP: No results for input(s): PROBNP in the last 8760 hours.  Coagulation:  Recent Labs Lab 07/25/16 0323 07/26/16 0635 07/27/16 0526  INR 2.17 1.93 1.92    ECG:   Medications:   Scheduled Medications: . lisinopril  2.5 mg Oral Daily  . PHENobarbital  97.2 mg Oral QHS  . potassium chloride  40 mEq Oral  Daily  . sertraline  100 mg Oral Daily  . sodium chloride flush  3 mL Intravenous Q12H  . spironolactone  12.5 mg Oral Daily  . torsemide  20 mg Oral Daily  . vitamin B-12  1,000 mcg Oral Daily  . Warfarin - Pharmacist Dosing Inpatient   Does not apply q1800    Infusions:   PRN Medications: sodium chloride, acetaminophen, ondansetron (ZOFRAN) IV, sodium chloride flush     Assessment/Plan   1. Acute on chronic systolic HF - echo LVEF 96-75%, cannot eval diastolic function  - negative 16mL yesterday, negative 4 liters since admission. IV lasix stopped, he will start oral torsemmide today.  - medical therapy with lisionpril, aldactone. Beta blocker held due to soft bp's according to notes. Follow bp's and see if can restart this admit.  - appears euvolemic by exam.    2. Tachy-brady - has ppm  3. Afib - on coumadin. Rates ok even off beta blocker  [name], M.D., F.A.C.C.Patient ID: Eddie Park, male   DOB: 01/26/1927, 82 y.o.   MRN: [id number] Patient ID: ERNST CUMPSTON, male   DOB: 1926/12/07, 82 y.o.   MRN: [id number]

## 2016-07-28 LAB — CBC
HCT: 27.6 % — ABNORMAL LOW (ref 39.0–52.0)
HEMOGLOBIN: 9 g/dL — AB (ref 13.0–17.0)
MCH: 27.6 pg (ref 26.0–34.0)
MCHC: 32.6 g/dL (ref 30.0–36.0)
MCV: 84.7 fL (ref 78.0–100.0)
Platelets: 135 10*3/uL — ABNORMAL LOW (ref 150–400)
RBC: 3.26 MIL/uL — AB (ref 4.22–5.81)
RDW: 16.1 % — ABNORMAL HIGH (ref 11.5–15.5)
WBC: 4.1 10*3/uL (ref 4.0–10.5)

## 2016-07-28 LAB — BASIC METABOLIC PANEL
ANION GAP: 8 (ref 5–15)
BUN: 30 mg/dL — AB (ref 6–20)
CHLORIDE: 94 mmol/L — AB (ref 101–111)
CO2: 27 mmol/L (ref 22–32)
Calcium: 8.6 mg/dL — ABNORMAL LOW (ref 8.9–10.3)
Creatinine, Ser: 1.3 mg/dL — ABNORMAL HIGH (ref 0.61–1.24)
GFR calc Af Amer: 55 mL/min — ABNORMAL LOW (ref >60)
GFR calc non Af Amer: 48 mL/min — ABNORMAL LOW (ref >60)
GLUCOSE: 91 mg/dL (ref 65–99)
POTASSIUM: 4.3 mmol/L (ref 3.5–5.1)
SODIUM: 129 mmol/L — AB (ref 135–145)

## 2016-07-28 LAB — PROTIME-INR
INR: 1.75
Prothrombin Time: 20.7 seconds — ABNORMAL HIGH (ref 11.4–15.2)

## 2016-07-28 MED ORDER — POTASSIUM CHLORIDE ER 20 MEQ PO TBCR: 20.0000 meq | EXTENDED_RELEASE_TABLET | Freq: Every day | ORAL | 0 refills | Status: DC

## 2016-07-28 MED ORDER — SPIRONOLACTONE 25 MG PO TABS: 12.5000 mg | ORAL_TABLET | Freq: Every day | ORAL | 1 refills | Status: DC

## 2016-07-28 MED ORDER — WARFARIN SODIUM 10 MG PO TABS
10.0000 mg | ORAL_TABLET | ORAL | Status: DC
  Administered 2016-07-28: 10 mg via ORAL
  Filled 2016-07-28: qty 1

## 2016-07-28 MED ORDER — WARFARIN SODIUM 7.5 MG PO TABS: 7.5000 mg | ORAL_TABLET | ORAL | Status: DC

## 2016-07-28 MED ORDER — LISINOPRIL 2.5 MG PO TABS: 2.5000 mg | ORAL_TABLET | Freq: Every day | ORAL | 3 refills | Status: DC

## 2016-07-28 NOTE — Progress Notes (Signed)
Patient was discharged to home with home health. IV and telemetry were d/c'd. Reviewed discharge instructions, AVS and medications with wife. Prescriptions were provided and wife was instructed to make follow up appointment within two weeks. Patient confirmed he had all belongings including dentures and hearing aid. Patient left floor via wheelchair.

## 2016-07-28 NOTE — Progress Notes (Signed)
82 y.o. male with medical history significant for known chronic combined systolic and diastolic heart failure in the setting of mitral regurgitation and aortic insufficiency, chronic kidney disease stage III, hypertension, atrial fibrillation on Coumadin, chronic hyponatremia, anemia of chronic disease  Subjective:   Feeling better this am , Breathing is is ok     Objective:   Temp:  [97.7 F (36.5 C)-98.6 F (37 C)] 98.6 F (37 C) (11/06 0636) Pulse Rate:  [59-64] 64 (11/06 0636) Resp:  [18] 18 (11/06 0636) BP: (100-128)/(48-54) 100/49 (11/06 0636) SpO2:  [94 %-99 %] 94 % (11/06 0636) Weight:  [134 lb 4.2 oz (60.9 kg)] 134 lb 4.2 oz (60.9 kg) (11/06 0431) Last BM Date: 07/27/16  Filed Weights   07/27/16 0137 07/27/16 2127 07/28/16 0431  Weight: 133 lb 2.5 oz (60.4 kg) 134 lb 4.2 oz (60.9 kg) 134 lb 4.2 oz (60.9 kg)    Intake/Output Summary (Last 24 hours) at 07/28/16 0910 Last data filed at 07/28/16 0636  Gross per 24 hour  Intake              480 ml  Output              475 ml  Net                5 ml    Telemetry: V-paced , personally reviewed    Exam:  General: elderly man, NAD  HEENT: sclera clear, throat clear  Resp: Clear to auscultation   Cardiac: RRR, no m/r/g, no jvd  GI: abdomen soft, NT,ND  MSK: no LE edema, decreased skin turgur  Neuro: no focal deficits  Psych: appropriate affect  Lab Results:  Basic Metabolic Panel:  Recent Labs Lab 07/26/16 0635 07/27/16 0526 07/28/16 0715  NA 129* 128* 129*  K 3.9 3.9 4.3  CL 92* 92* 94*  CO2 28 27 27   GLUCOSE 90 86 91  BUN 29* 27* 30*  CREATININE 1.30* 1.27* 1.30*  CALCIUM 8.7* 8.5* 8.6*    Liver Function Tests:  Recent Labs Lab 07/22/16 0845  AST 25  ALT 15*  ALKPHOS 99  BILITOT 1.0  PROT 6.9  ALBUMIN 3.7    CBC:  Recent Labs Lab 07/26/16 0635 07/27/16 0526 07/28/16 0715  WBC 4.5 4.2 4.1  HGB 8.8* 9.0* 9.0*  HCT 27.0* 27.4* 27.6*  MCV 84.1 84.3 84.7  PLT 140* 141*  135*    Cardiac Enzymes:  Recent Labs Lab 07/22/16 0845  TROPONINI 0.04*    BNP: No results for input(s): PROBNP in the last 8760 hours.  Coagulation:  Recent Labs Lab 07/26/16 0635 07/27/16 0526 07/28/16 0715  INR 1.93 1.92 1.75    ECG:   Medications:   Scheduled Medications: . lisinopril  2.5 mg Oral Daily  . PHENobarbital  97.2 mg Oral QHS  . potassium chloride  40 mEq Oral Daily  . sertraline  100 mg Oral Daily  . sodium chloride flush  3 mL Intravenous Q12H  . spironolactone  12.5 mg Oral Daily  . torsemide  20 mg Oral Daily  . vitamin B-12  1,000 mcg Oral Daily  . warfarin  10 mg Oral Once per day on Mon Fri  . warfarin  7.5 mg Oral Once per day on Sun Tue Wed Thu Sat  . Warfarin - Pharmacist Dosing Inpatient   Does not apply q1800    Infusions:   PRN Medications: sodium chloride, acetaminophen, ondansetron (ZOFRAN) IV, sodium chloride flush  Assessment/Plan   1. Acute on chronic systolic HF - echo LVEF 15-40%, cannot eval diastolic function  - negative 168mL yesterday, negative 3.8 liters this admission He is breathing well Has decreased skin turgur, I think he is no longer volume overloaded  He is still very weak and has significant deconditioning . He may need to be seen by PT/OT  Palliative care has seen   2. Tachy-brady - has ppm  3. Afib - on coumadin. Rates ok even off beta blocker  No further cardiology recommendations. Will sign off. Call for questions     Mertie Moores, MD  07/28/2016 9:32 AM    [address],  [place], [place]  [id number] Pager [phone] Phone: [phone]; Fax: [phone]

## 2016-07-28 NOTE — Progress Notes (Signed)
PT Cancellation Note  Patient Details Name: Eddie Park MRN: [id number] DOB: 12/01/1926   Cancelled Treatment:    Reason Eval/Treat Not Completed: Other (comment) (Wife refused visit despite PT encouraging to cover mobility).  Wife states that they are leaving today and will not interfere with his discharge process, despite PT encouraging to work with her.   Ramond Dial 07/28/2016, 5:07 PM    Mee Hives, PT MS Acute Rehab Dept. Number: [place] and [place]

## 2016-07-28 NOTE — Care Management Important Message (Signed)
Important Message  Patient Details  Name: MELQUISEDEC JOURNEY MRN: [id number] Date of Birth: 1927/07/16   Medicare Important Message Given:  Yes    Charo Philipp 07/28/2016, 11:17 AM

## 2016-07-28 NOTE — Progress Notes (Signed)
ANTICOAGULATION CONSULT NOTE - Follow-Up  Pharmacy Consult for warfarin Indication: atrial fibrillation  No Known Allergies  Patient Measurements: Height: 5\' 6"  (167.6 cm) Weight: 134 lb 4.2 oz (60.9 kg) IBW/kg (Calculated) : 63.8   Vital Signs: Temp: 98.4 F (36.9 C) (11/06 0915) Temp Source: Oral (11/06 0915) BP: 101/44 (11/06 0915) Pulse Rate: 64 (11/06 0915)  Labs:  Recent Labs  07/26/16 0635 07/27/16 0526 07/28/16 0715  HGB 8.8* 9.0* 9.0*  HCT 27.0* 27.4* 27.6*  PLT 140* 141* 135*  LABPROT 22.3* 22.3* 20.7*  INR 1.93 1.92 1.75  CREATININE 1.30* 1.27* 1.30*    Estimated Creatinine Clearance: 34.5 mL/min (by C-G formula based on SCr of 1.3 mg/dL (H)).   Assessment: 72 YOM admitted with SOB and abdominal swelling thought to be from HF exacerbation.  He is on warfarin PTA for AFib-  INR on admission elevated at 3.85. Warfarin dose was missed on 11/4 and INR has fallen to 1.75 this morning. A higher dose was given yesterday evening in response to missed dose.  *PTA dose of 7.5 mg daily EXCEPT for 10 mg on Mon/Wed/Fri. The patient and wife were re-educated on warfarin this admission.  Goal of Therapy:  INR 2-3 Monitor platelets by anticoagulation protocol: Yes   Plan:  -Continue home dose of warfarin 10mg  on Mondays, Wednesdays and Fridays with warfarin 7.5mg  other days -Daily INR while here -Recommend INR check as an outpatient Friday 11/10  Azya Barbero D. Kwane Rohl, PharmD, BCPS Clinical Pharmacist Pager: [phone] 07/28/2016 10:37 AM

## 2016-07-28 NOTE — Consult Note (Signed)
           [facility] CM Primary Care Navigator  07/28/2016  Eddie Park May 06, 1927 [id number]    Patient seen at the bedside with wife [name]) to identifypossible discharge needs.  Patient's wife shared that patient had shortness of breath, wheezing, swelling of the abdomen, legs/feet/ankles thatled to this admission.  Discharge planis home with home health services from [facility] (PT, OT, RN, aide and SW). Patient was also referred to [facility] ([place] for Palliative care after discharge.  Patient's wife confirms that primary care provider is Dr. Leighton Ruff with [facility]. Wife provides transportation to hisdoctors'appointments and she is the primary caregiver at home.  According to wife, their daughter [facility] RN) also provides assist to patient's care when available. Wife reports that she had previously arranged prior to this admission, a home care aide from [place] coming once a week for 3 hours to assist with care.   Wife states using [employer] educational needs teacher) to obtain medications without difficulty. Patient's wife manages his medications  at home using "pill box"system as stated.  Patient and wife expressed understanding to call primary care provider's office once discharged, for a post discharge follow-up appointment within a week or sooner if needs arise.  Patient letter provided as a reminder.  According to wife, Dr. Irish Lack (cardiologist) is managing patient's HF. She deniesany needs and declined [facility] services for disease management/ education and care coordination at this time.    For questions, please contact:  Dannielle Huh, BSN, RN- [facility] Primary Care Navigator  Telephone: [phone] [place]

## 2016-07-28 NOTE — Discharge Summary (Signed)
Physician Discharge Summary  Eddie Park VQM:[id number] DOB: 1927/08/28 DOA: 07/22/2016  PCP: Gerrit Heck, MD  Admit date: 07/22/2016 Discharge date: 07/28/2016  Time spent: 60 minutes  Recommendations for Outpatient Follow-up:  1. Follow up with Dr. Irish Lack, cardiology in 2 weeks. On follow-up it will be determined as to whether patient's beta blocker needs to be resumed as this was discontinued on discharge due to heart rate in the 50s to 60s. Blood pressure. Also follow-up is needed to be determined as to whether patient could continue on his Coumadin as per wife had had some recent falls. Patient will need a basic metabolic profile done to follow-up on electrolytes and renal function. 2. Patient is to follow-up in the Coumadin clinic on Friday, 08/01/2016.   Discharge Diagnoses:  Principal Problem:   Acute on chronic combined systolic and diastolic CHF, NYHA class 3 (HCC) Active Problems:   Permanent atrial fibrillation (HCC)   Hypertension   Severe mitral regurgitation   Moderate aortic insufficiency   Anasarca   Chronic hyponatremia   FTT (failure to thrive) in adult   Anemia, chronic disease   CKD (chronic kidney disease) stage 3, GFR 30-59 ml/min   Palliative care encounter   Goals of care, counseling/discussion   Encounter for hospice care discussion   Discharge Condition: Stable and improved  Diet recommendation: Heart healthy  Filed Weights   07/27/16 0137 07/27/16 2127 07/28/16 0431  Weight: 60.4 kg (133 lb 2.5 oz) 60.9 kg (134 lb 4.2 oz) 60.9 kg (134 lb 4.2 oz)    History of present illness:  Per Dr Atilano Ina is a 82 y.o. male with medical history significant for known chronic combined systolic and diastolic heart failure in the setting of mitral regurgitation and aortic insufficiency, chronic kidney disease stage III, hypertension, atrial fibrillation on Coumadin, chronic hyponatremia, anemia of chronic disease. Patient was  recently evaluated at the cardiology office on 10/27 with symptoms concerning for acute heart failure exacerbation. Was felt due to degree gut edema that his Lasix was not being absorbed properly so he was transitioned to torsemide 20 mg daily. Lisinopril was held to allow BP more room for diuresis. Recommendation was to increase the tablet to 1.5 if no significant increase in edema seen for 2-3 days following that clinic visit. A follow-up visit in 4-5 days was also recommended. Unfortunately the patient had not responded to the diuretics and appearred to be more hypoxemic and having more difficulty breathing and reported discomfort secondary to abdominal swelling. The swelling in his feet had decreased.  In addition, patient had been having some falls and this was concerning in setting of chronic anticoagulation. He had progressive failure to thrive over several weeks without any focal neurological deficits. Patient is a DO NOT RESUSCITATE but wife was well aware that patient may be meeting the end stages of his process and if he did not show significant response to diuretics in the next 24-48 hours that goals of care needed to be addressed.  ED Course:  Vital Signs: BP 126/55   Pulse 62   Temp 98.4 F (36.9 C) (Rectal)   Resp 13   SpO2 97%  2 view chest x-ray: Pulmonary vascular congestion, COPD with right pleural effusion with significant right basilar atelectasis Lab data: Sodium 126, potassium 3.9, chloride 93, CO2 23, BUN 31, creatinine 1.47, glucose 118, LFTs nonelevated, BNP 2424, troponin 0.04, white count 5300 with normal differential, hemoglobin 10, platelets 114,000 ABG: PH 7.49, PCO2 27, PO2  60, acid base 1, bicarbonate 21 Medications and treatments: Lasix 80 mg IV 1  Hospital Course:  #1 acute on chronic Systolic and diastolic heart failure/anasarca Patient failed outpatient treatment as was recently seen a cardiologist office and Lasix changed to [facility] for better absorption  however patient presented to the ED with worsening shortness of breath and was subsequently admitted. Patient denied any chest pain. BNP on 07/23/2016, noted to be significantly elevated. Troponin minimally elevated at 0.04.2-D echo with EF of 40-45%, moderate AR, mild MR, moderate TR. Patient was placed on IV Lasix with good diuresis and was -3.675 L during the hospitalization. Patient's ACE inhibitor and beta blocker were held on admission for better diuresis and also due to soft blood pressure. Cardiology followed patient throughout the hospitalization. ACE inhibitor was resumed at a low dose as well as low dose spironolactone added to patient's regimen per cardiology. IV Lasix was subsequently transitioned to oral Demadex which patient tolerated. Patient will be discharged home in stable and improved condition. Patient will be discharged off a beta blocker due to slow heart rates in the 50s to the 60s and borderline blood pressure. Patient will follow-up with cardiology as outpatient will be determined whether patient beta blocker needs to be added back on.  #2 history of tachybradycardia syndrome/A. fib status post PPM Patient with a pacemaker. Per nursing patient had heart rates in the 30s on 07/23/2016, however PPM was recently interrogated and doing fine. Patient had remained asymptomatic. Beta blocker was held during the hospitalization secondary to some blood pressure for better diuresis and renal function. 2-D echo with EF of 40-45%, moderate AR, mild MR, moderate TR. Cardiology consulted and followed the patient throughout the hospitalization. INR was followed throughout the hospitalization and was initially supratherapeutic patient's Coumadin was held. INR trended down and was therapeutic by day of discharge. Patient is to follow-up in the Coumadin clinic.  Patient's heart rate remained in the 50s to 60s off his beta blocker and due to borderline blood pressure issues it was not resumed on  discharge. Patient will follow-up with cardiology and outpatient setting at which point in time may be decided as to whether patient will benefit from adding it back.  It was noted per wife that patient had had some falls in October however patient's Coumadin was not discontinued on discharge and patient will follow-up with cardiology in the outpatient setting at which point in time it will be decided on his Coumadin.   #3 history of coronary artery disease status post CABG Patient was treated for acute CHF exacerbation on IV diuretics. Beta blocker and ACE inhibitor were initially held in order to be able to diureses aggressively. Low dose ace inhibitor resumed per cardiology. Low-dose spironolactone also added to patient's regimen. 2-D echo with EF of 40-45%, moderate AR, mild MR, moderate TR. Patient improved clinically in terms of his volume overload and IV diuretics were transitioned back to his home dose of oral Demadex. Due to borderline blood pressure and slow heart rate in the 50s to 60s patient's beta blocker was not resumed on discharge. This will need to be reassessed on follow-up with his cardiologist. Patient will be discharged in stable and improved condition.   #4 chronic kidney disease stage III Remained stable throughout the hospitalization. Outpatient follow-up.   #5 chronic hyponatremia Likely secondary to hypervolemic hyponatremia secondary to CHF exacerbation. Improved with diuresis.   #6 failure to thrive in adult Patient noted to have symptoms of failure to thrive. Patient  currently DO NOT RESUSCITATE. Patient had been seen in consultation by palliative care were recommending that at the minimal patient be discharged with palliative care following.  #7 anemia of chronic disease  H&H stable.  #8 history of prostate cancer Status post urinary sphincter ring.   #9 moderate aortic insufficiency/severe mitral regurgitation Per primary cardiologist note patient not a  surgical candidate. Medical management.    Procedures:  Chest x-ray 07/22/2016  2-D echo 07/24/2016  Consultations:  Cardiology: Dr. Claiborne Billings 07/23/2016  Palliative care Dr. Rowe Pavy 07/22/2016   Discharge Exam: Vitals:   07/28/16 0915 07/28/16 1206  BP: (!) 101/44 (!) 109/46  Pulse: 64   Resp: 18   Temp: 98.4 F (36.9 C)     General: NAD Cardiovascular: RRR Respiratory: CTAB  Discharge Instructions   Discharge Instructions    Diet - low sodium heart healthy    Complete by:  As directed    Increase activity slowly    Complete by:  As directed      Current Discharge Medication List    START taking these medications   Details  lisinopril (PRINIVIL,ZESTRIL) 2.5 MG tablet Take 1 tablet (2.5 mg total) by mouth daily. Qty: 30 tablet, Refills: 3    potassium chloride 20 MEQ TBCR Take 20 mEq by mouth daily. Qty: 30 tablet, Refills: 0    spironolactone (ALDACTONE) 25 MG tablet Take 0.5 tablets (12.5 mg total) by mouth daily. Qty: 30 tablet, Refills: 1      CONTINUE these medications which have NOT CHANGED   Details  cholecalciferol (VITAMIN D) 1000 UNITS tablet Take 2,000 Units by mouth daily.    fish oil-omega-3 fatty acids 1000 MG capsule Take 2 g by mouth daily.    Multiple Vitamin (MULTIVITAMIN WITH MINERALS) TABS Take 1 tablet by mouth daily.    PHENobarbital (LUMINAL) 97.2 MG tablet Take 97.2 mg by mouth at bedtime.    sertraline (ZOLOFT) 100 MG tablet Take 100 mg by mouth daily.    torsemide (DEMADEX) 20 MG tablet Take 1 tablet (20 mg total) by mouth daily. Qty: 30 tablet, Refills: 6    vitamin B-12 (CYANOCOBALAMIN) 1000 MCG tablet Take 1,000 mcg by mouth daily.    warfarin (COUMADIN) 5 MG tablet Take 7.5-10 mg by mouth daily. Monday, Wednesday and Friday take 10mg . All other days take 7.5mg       STOP taking these medications     metoprolol succinate (TOPROL-XL) 50 MG 24 hr tablet        No Known Allergies Follow-up Information    [facility] Follow up.   Why:  For home health services, they will set up first home appointment 1-2 days after hospital discahrge Contact information: [address] [place] [address] [phone]        [facility] at [facility] Follow up.   Specialty:  Hospice and Palliative Medicine Why:  For palliative care.  Contact information: [place] [id number] [phone]        Larae Grooms, MD. Schedule an appointment as soon as possible for a visit in 2 week(s).   Specialties:  Cardiology, Radiology, Interventional Cardiology Contact information: [address] [address] [phone]        coumadin clinic Follow up on 08/01/2016.   Why:  go to coumadin clinic for PT/INR check           The results of significant diagnostics from this hospitalization (including imaging, microbiology, ancillary and laboratory) are  listed below for reference.    Significant Diagnostic Studies: Dg Chest 2 View  Result Date: 07/22/2016 CLINICAL DATA:  Weakness, shortness of breath for 1 week, question pulmonary edema, history coronary artery disease, hypertension, stage III chronic kidney disease, valvular heart disease EXAM: CHEST  2 VIEW COMPARISON:  03/29/2012 FINDINGS: LEFT subclavian transvenous pacemaker leads project at RIGHT atrium and RIGHT ventricle, unchanged. Enlargement of cardiac silhouette with pulmonary vascular congestion. Atherosclerotic calcification aorta. RIGHT pleural effusion and significant basilar atelectasis. Minimal LEFT basilar atelectasis. Underlying emphysematous changes. No definite pulmonary edema or pneumothorax. Bones diffusely demineralized. IMPRESSION: Enlargement of cardiac silhouette with pulmonary vascular congestion post pacemaker. COPD changes with RIGHT pleural effusion and significant RIGHT basilar atelectasis. Aortic atherosclerosis. Electronically Signed   By: Lavonia Dana M.D.    On: 07/22/2016 09:34    Microbiology: Recent Results (from the past 240 hour(s))  Urine culture     Status: Abnormal   Collection Time: 07/22/16  1:26 PM  Result Value Ref Range Status   Specimen Description URINE, RANDOM  Final   Special Requests NONE  Final   Culture MULTIPLE SPECIES PRESENT, SUGGEST RECOLLECTION (A)  Final   Report Status 07/23/2016 FINAL  Final     Labs: Basic Metabolic Panel:  Recent Labs Lab 07/24/16 0529 07/25/16 0323 07/26/16 0635 07/27/16 0526 07/28/16 0715  NA 129* 129* 129* 128* 129*  K 3.4* 3.6 3.9 3.9 4.3  CL 93* 92* 92* 92* 94*  CO2 25 27 28 27 27   GLUCOSE 101* 86 90 86 91  BUN 31* 28* 29* 27* 30*  CREATININE 1.31* 1.23 1.30* 1.27* 1.30*  CALCIUM 8.5* 8.3* 8.7* 8.5* 8.6*   Liver Function Tests:  Recent Labs Lab 07/22/16 0845  AST 25  ALT 15*  ALKPHOS 99  BILITOT 1.0  PROT 6.9  ALBUMIN 3.7   No results for input(s): LIPASE, AMYLASE in the last 168 hours. No results for input(s): AMMONIA in the last 168 hours. CBC:  Recent Labs Lab 07/22/16 0845  07/24/16 0529 07/25/16 0323 07/26/16 0635 07/27/16 0526 07/28/16 0715  WBC 5.3  < > 5.1 4.9 4.5 4.2 4.1  NEUTROABS 3.6  --   --   --   --   --   --   HGB 10.0*  < > 9.5* 8.7* 8.8* 9.0* 9.0*  HCT 29.8*  < > 28.5* 26.2* 27.0* 27.4* 27.6*  MCV 83.9  < > 83.8 83.4 84.1 84.3 84.7  PLT 114*  < > 129* 140* 140* 141* 135*  < > = values in this interval not displayed. Cardiac Enzymes:  Recent Labs Lab 07/22/16 0845  TROPONINI 0.04*   BNP: BNP (last 3 results)  Recent Labs  07/22/16 0845 07/25/16 0323 07/26/16 0635  BNP 2,423.5* 1,360.9* 1,450.6*    ProBNP (last 3 results) No results for input(s): PROBNP in the last 8760 hours.  CBG: No results for input(s): GLUCAP in the last 168 hours.     SignedIrine Seal MD.  [facility] 07/28/2016, 2:56 PM

## 2016-07-30 ENCOUNTER — Telehealth: Payer: Self-pay | Admitting: Pediatrics

## 2016-07-30 ENCOUNTER — Telehealth: Payer: Self-pay | Admitting: Interventional Cardiology

## 2016-07-30 DIAGNOSIS — I4891 Unspecified atrial fibrillation: Secondary | ICD-10-CM | POA: Diagnosis not present

## 2016-07-30 DIAGNOSIS — Z9181 History of falling: Secondary | ICD-10-CM | POA: Diagnosis not present

## 2016-07-30 DIAGNOSIS — S50812D Abrasion of left forearm, subsequent encounter: Secondary | ICD-10-CM | POA: Diagnosis not present

## 2016-07-30 DIAGNOSIS — I5042 Chronic combined systolic (congestive) and diastolic (congestive) heart failure: Secondary | ICD-10-CM | POA: Diagnosis not present

## 2016-07-30 DIAGNOSIS — Z8546 Personal history of malignant neoplasm of prostate: Secondary | ICD-10-CM | POA: Diagnosis not present

## 2016-07-30 DIAGNOSIS — I252 Old myocardial infarction: Secondary | ICD-10-CM | POA: Diagnosis not present

## 2016-07-30 DIAGNOSIS — I130 Hypertensive heart and chronic kidney disease with heart failure and stage 1 through stage 4 chronic kidney disease, or unspecified chronic kidney disease: Secondary | ICD-10-CM | POA: Diagnosis not present

## 2016-07-30 DIAGNOSIS — N183 Chronic kidney disease, stage 3 (moderate): Secondary | ICD-10-CM | POA: Diagnosis not present

## 2016-07-30 DIAGNOSIS — F419 Anxiety disorder, unspecified: Secondary | ICD-10-CM | POA: Diagnosis not present

## 2016-07-30 DIAGNOSIS — Z66 Do not resuscitate: Secondary | ICD-10-CM | POA: Diagnosis not present

## 2016-07-30 DIAGNOSIS — G40909 Epilepsy, unspecified, not intractable, without status epilepticus: Secondary | ICD-10-CM | POA: Diagnosis not present

## 2016-07-30 DIAGNOSIS — I2510 Atherosclerotic heart disease of native coronary artery without angina pectoris: Secondary | ICD-10-CM | POA: Diagnosis not present

## 2016-07-30 DIAGNOSIS — Z5181 Encounter for therapeutic drug level monitoring: Secondary | ICD-10-CM | POA: Diagnosis not present

## 2016-07-30 DIAGNOSIS — Z950 Presence of cardiac pacemaker: Secondary | ICD-10-CM | POA: Diagnosis not present

## 2016-07-30 DIAGNOSIS — R627 Adult failure to thrive: Secondary | ICD-10-CM | POA: Diagnosis not present

## 2016-07-30 DIAGNOSIS — D638 Anemia in other chronic diseases classified elsewhere: Secondary | ICD-10-CM | POA: Diagnosis not present

## 2016-07-30 DIAGNOSIS — Z7901 Long term (current) use of anticoagulants: Secondary | ICD-10-CM | POA: Diagnosis not present

## 2016-07-30 NOTE — Telephone Encounter (Signed)
[facility] Nurse Nira Conn) called today requesting verbal order for patient to have PT/INR done at home 08/01/16. She states he is too weak to come into the Coumadin Clinic this Friday.  I did give verbal order for PT/INR to be done by [facility] Nurse on 08/01/2016 (per DC instructions 07/22/16 Dr. Linna Darner). I advised her to be sure to forward those results to Dr. Irish Lack.  She states they will call us when in the home to be sure there are no dosage changes.  I advised her to call the Coumadin Clinic, gave phone and fax number.  She voiced understanding and agreed with plan.

## 2016-07-30 NOTE — Telephone Encounter (Signed)
Home health nurse calling to get a verbal order to do an [place] check

## 2016-07-30 NOTE — Telephone Encounter (Signed)
DUPLICATE. See other TE opened today.

## 2016-07-31 DIAGNOSIS — G40909 Epilepsy, unspecified, not intractable, without status epilepticus: Secondary | ICD-10-CM | POA: Diagnosis not present

## 2016-07-31 DIAGNOSIS — I2510 Atherosclerotic heart disease of native coronary artery without angina pectoris: Secondary | ICD-10-CM | POA: Diagnosis not present

## 2016-07-31 DIAGNOSIS — I4891 Unspecified atrial fibrillation: Secondary | ICD-10-CM | POA: Diagnosis not present

## 2016-07-31 DIAGNOSIS — N183 Chronic kidney disease, stage 3 (moderate): Secondary | ICD-10-CM | POA: Diagnosis not present

## 2016-07-31 DIAGNOSIS — I130 Hypertensive heart and chronic kidney disease with heart failure and stage 1 through stage 4 chronic kidney disease, or unspecified chronic kidney disease: Secondary | ICD-10-CM | POA: Diagnosis not present

## 2016-07-31 DIAGNOSIS — I5042 Chronic combined systolic (congestive) and diastolic (congestive) heart failure: Secondary | ICD-10-CM | POA: Diagnosis not present

## 2016-08-01 ENCOUNTER — Ambulatory Visit (INDEPENDENT_AMBULATORY_CARE_PROVIDER_SITE_OTHER): Payer: Medicare Other | Admitting: Internal Medicine

## 2016-08-01 DIAGNOSIS — I4891 Unspecified atrial fibrillation: Secondary | ICD-10-CM | POA: Diagnosis not present

## 2016-08-01 DIAGNOSIS — I4821 Permanent atrial fibrillation: Secondary | ICD-10-CM

## 2016-08-01 DIAGNOSIS — N183 Chronic kidney disease, stage 3 (moderate): Secondary | ICD-10-CM | POA: Diagnosis not present

## 2016-08-01 DIAGNOSIS — I482 Chronic atrial fibrillation: Secondary | ICD-10-CM

## 2016-08-01 DIAGNOSIS — Z5181 Encounter for therapeutic drug level monitoring: Secondary | ICD-10-CM

## 2016-08-01 DIAGNOSIS — G40909 Epilepsy, unspecified, not intractable, without status epilepticus: Secondary | ICD-10-CM | POA: Diagnosis not present

## 2016-08-01 DIAGNOSIS — I5042 Chronic combined systolic (congestive) and diastolic (congestive) heart failure: Secondary | ICD-10-CM | POA: Diagnosis not present

## 2016-08-01 DIAGNOSIS — I2510 Atherosclerotic heart disease of native coronary artery without angina pectoris: Secondary | ICD-10-CM | POA: Diagnosis not present

## 2016-08-01 DIAGNOSIS — I130 Hypertensive heart and chronic kidney disease with heart failure and stage 1 through stage 4 chronic kidney disease, or unspecified chronic kidney disease: Secondary | ICD-10-CM | POA: Diagnosis not present

## 2016-08-01 LAB — POCT INR: INR: 2.5

## 2016-08-04 DIAGNOSIS — I130 Hypertensive heart and chronic kidney disease with heart failure and stage 1 through stage 4 chronic kidney disease, or unspecified chronic kidney disease: Secondary | ICD-10-CM | POA: Diagnosis not present

## 2016-08-04 DIAGNOSIS — I2510 Atherosclerotic heart disease of native coronary artery without angina pectoris: Secondary | ICD-10-CM | POA: Diagnosis not present

## 2016-08-04 DIAGNOSIS — I4891 Unspecified atrial fibrillation: Secondary | ICD-10-CM | POA: Diagnosis not present

## 2016-08-04 DIAGNOSIS — N183 Chronic kidney disease, stage 3 (moderate): Secondary | ICD-10-CM | POA: Diagnosis not present

## 2016-08-04 DIAGNOSIS — R531 Weakness: Secondary | ICD-10-CM | POA: Diagnosis not present

## 2016-08-04 DIAGNOSIS — I5042 Chronic combined systolic (congestive) and diastolic (congestive) heart failure: Secondary | ICD-10-CM | POA: Diagnosis not present

## 2016-08-04 DIAGNOSIS — G40909 Epilepsy, unspecified, not intractable, without status epilepticus: Secondary | ICD-10-CM | POA: Diagnosis not present

## 2016-08-05 ENCOUNTER — Encounter: Payer: Self-pay | Admitting: Interventional Cardiology

## 2016-08-05 DIAGNOSIS — I2510 Atherosclerotic heart disease of native coronary artery without angina pectoris: Secondary | ICD-10-CM | POA: Diagnosis not present

## 2016-08-05 DIAGNOSIS — I4891 Unspecified atrial fibrillation: Secondary | ICD-10-CM | POA: Diagnosis not present

## 2016-08-05 DIAGNOSIS — I340 Nonrheumatic mitral (valve) insufficiency: Secondary | ICD-10-CM | POA: Diagnosis not present

## 2016-08-05 DIAGNOSIS — Z681 Body mass index (BMI) 19 or less, adult: Secondary | ICD-10-CM | POA: Diagnosis not present

## 2016-08-05 DIAGNOSIS — I5042 Chronic combined systolic (congestive) and diastolic (congestive) heart failure: Secondary | ICD-10-CM | POA: Diagnosis not present

## 2016-08-05 DIAGNOSIS — N183 Chronic kidney disease, stage 3 (moderate): Secondary | ICD-10-CM | POA: Diagnosis not present

## 2016-08-05 DIAGNOSIS — I5022 Chronic systolic (congestive) heart failure: Secondary | ICD-10-CM | POA: Diagnosis not present

## 2016-08-05 DIAGNOSIS — D649 Anemia, unspecified: Secondary | ICD-10-CM | POA: Diagnosis not present

## 2016-08-05 DIAGNOSIS — I351 Nonrheumatic aortic (valve) insufficiency: Secondary | ICD-10-CM | POA: Diagnosis not present

## 2016-08-05 DIAGNOSIS — E871 Hypo-osmolality and hyponatremia: Secondary | ICD-10-CM | POA: Diagnosis not present

## 2016-08-05 DIAGNOSIS — I130 Hypertensive heart and chronic kidney disease with heart failure and stage 1 through stage 4 chronic kidney disease, or unspecified chronic kidney disease: Secondary | ICD-10-CM | POA: Diagnosis not present

## 2016-08-05 DIAGNOSIS — Z79899 Other long term (current) drug therapy: Secondary | ICD-10-CM | POA: Diagnosis not present

## 2016-08-05 DIAGNOSIS — G40909 Epilepsy, unspecified, not intractable, without status epilepticus: Secondary | ICD-10-CM | POA: Diagnosis not present

## 2016-08-05 DIAGNOSIS — Z66 Do not resuscitate: Secondary | ICD-10-CM | POA: Diagnosis not present

## 2016-08-06 ENCOUNTER — Telehealth: Payer: Self-pay | Admitting: Interventional Cardiology

## 2016-08-06 NOTE — Telephone Encounter (Signed)
OK to continue current meds bsed on lab values.

## 2016-08-06 NOTE — Telephone Encounter (Addendum)
We have received the pts lab results from Dr Drema Dallas office. 08/05/16 BUN=59 Creatinine=1.40 (Results have been given to medical records to scan into the pts chart.)  The pt was in the hospital from 10/31 until 11/6 for HF. He takes Torsemide 20 mg daily and was started on Spironolactone 12.5 mg daily while in the hospital.   Discussed results with Dr Acie Fredrickson (DOD) who recommends that I forward results to Dr Irish Lack for his review and advisement .  Please advise.

## 2016-08-06 NOTE — Telephone Encounter (Signed)
Pt's wife called to report on labs by PCP faxed results to this office.  PCP thinks pt needs medications needs to be adjusted.  Wife checking to see if this was received.   Please give her a call back with what to do.

## 2016-08-06 NOTE — Telephone Encounter (Signed)
The pts wife, [name], is advised and she verbalized understanding. She thanked me for getting back in touch with her as quick as I did as she states that she was really concerned.  Also I called Dr Drema Dallas office and left a message for her nurse stating that Dr Irish Lack is making no changes due to lab results. I did leave the offices phone number for her to call back if she has any questions.

## 2016-08-06 NOTE — Telephone Encounter (Signed)
**Note De-Identified  Obfuscation** The pts wife states that the pt had labs drawn at Dr Drema Dallas office yesterday and that they called her this morning to let her know that the pts kidney function is decreased but she does not know the actual result.  She was advised that they were faxing results to Dr Irish Lack this morning. We have not received as of this time.  She states that she was advised by Dr Drema Dallas office to contact us for adjustment in the pts diuretics.  She is advised that if I have not received the pts lab results by 3 pm today that I will contact Dr Drema Dallas office and request results.  She verbalized understanding and thanked me for my assistance.

## 2016-08-07 DIAGNOSIS — I130 Hypertensive heart and chronic kidney disease with heart failure and stage 1 through stage 4 chronic kidney disease, or unspecified chronic kidney disease: Secondary | ICD-10-CM | POA: Diagnosis not present

## 2016-08-07 DIAGNOSIS — N183 Chronic kidney disease, stage 3 (moderate): Secondary | ICD-10-CM | POA: Diagnosis not present

## 2016-08-07 DIAGNOSIS — I2510 Atherosclerotic heart disease of native coronary artery without angina pectoris: Secondary | ICD-10-CM | POA: Diagnosis not present

## 2016-08-07 DIAGNOSIS — I5042 Chronic combined systolic (congestive) and diastolic (congestive) heart failure: Secondary | ICD-10-CM | POA: Diagnosis not present

## 2016-08-07 DIAGNOSIS — I4891 Unspecified atrial fibrillation: Secondary | ICD-10-CM | POA: Diagnosis not present

## 2016-08-07 DIAGNOSIS — G40909 Epilepsy, unspecified, not intractable, without status epilepticus: Secondary | ICD-10-CM | POA: Diagnosis not present

## 2016-08-08 ENCOUNTER — Ambulatory Visit (INDEPENDENT_AMBULATORY_CARE_PROVIDER_SITE_OTHER): Payer: Medicare Other | Admitting: Cardiology

## 2016-08-08 DIAGNOSIS — I4891 Unspecified atrial fibrillation: Secondary | ICD-10-CM | POA: Diagnosis not present

## 2016-08-08 DIAGNOSIS — I482 Chronic atrial fibrillation: Secondary | ICD-10-CM

## 2016-08-08 DIAGNOSIS — N183 Chronic kidney disease, stage 3 (moderate): Secondary | ICD-10-CM | POA: Diagnosis not present

## 2016-08-08 DIAGNOSIS — I5042 Chronic combined systolic (congestive) and diastolic (congestive) heart failure: Secondary | ICD-10-CM | POA: Diagnosis not present

## 2016-08-08 DIAGNOSIS — I4821 Permanent atrial fibrillation: Secondary | ICD-10-CM

## 2016-08-08 DIAGNOSIS — G40909 Epilepsy, unspecified, not intractable, without status epilepticus: Secondary | ICD-10-CM | POA: Diagnosis not present

## 2016-08-08 DIAGNOSIS — I2510 Atherosclerotic heart disease of native coronary artery without angina pectoris: Secondary | ICD-10-CM | POA: Diagnosis not present

## 2016-08-08 DIAGNOSIS — I130 Hypertensive heart and chronic kidney disease with heart failure and stage 1 through stage 4 chronic kidney disease, or unspecified chronic kidney disease: Secondary | ICD-10-CM | POA: Diagnosis not present

## 2016-08-08 DIAGNOSIS — Z5181 Encounter for therapeutic drug level monitoring: Secondary | ICD-10-CM

## 2016-08-08 LAB — POCT INR: INR: 2.7

## 2016-08-11 DIAGNOSIS — I5042 Chronic combined systolic (congestive) and diastolic (congestive) heart failure: Secondary | ICD-10-CM | POA: Diagnosis not present

## 2016-08-11 DIAGNOSIS — I130 Hypertensive heart and chronic kidney disease with heart failure and stage 1 through stage 4 chronic kidney disease, or unspecified chronic kidney disease: Secondary | ICD-10-CM | POA: Diagnosis not present

## 2016-08-11 DIAGNOSIS — I2510 Atherosclerotic heart disease of native coronary artery without angina pectoris: Secondary | ICD-10-CM | POA: Diagnosis not present

## 2016-08-11 DIAGNOSIS — G40909 Epilepsy, unspecified, not intractable, without status epilepticus: Secondary | ICD-10-CM | POA: Diagnosis not present

## 2016-08-11 DIAGNOSIS — N183 Chronic kidney disease, stage 3 (moderate): Secondary | ICD-10-CM | POA: Diagnosis not present

## 2016-08-11 DIAGNOSIS — I4891 Unspecified atrial fibrillation: Secondary | ICD-10-CM | POA: Diagnosis not present

## 2016-08-12 ENCOUNTER — Encounter: Payer: Self-pay | Admitting: Cardiology

## 2016-08-12 ENCOUNTER — Ambulatory Visit (INDEPENDENT_AMBULATORY_CARE_PROVIDER_SITE_OTHER): Payer: Medicare Other | Admitting: Cardiology

## 2016-08-12 VITALS — BP 102/42 | HR 63 | Ht 66.0 in | Wt 111.8 lb

## 2016-08-12 DIAGNOSIS — I5043 Acute on chronic combined systolic (congestive) and diastolic (congestive) heart failure: Secondary | ICD-10-CM | POA: Diagnosis not present

## 2016-08-12 DIAGNOSIS — D649 Anemia, unspecified: Secondary | ICD-10-CM

## 2016-08-12 DIAGNOSIS — I4821 Permanent atrial fibrillation: Secondary | ICD-10-CM

## 2016-08-12 DIAGNOSIS — I482 Chronic atrial fibrillation: Secondary | ICD-10-CM

## 2016-08-12 DIAGNOSIS — R5383 Other fatigue: Secondary | ICD-10-CM

## 2016-08-12 LAB — CBC WITH DIFFERENTIAL/PLATELET
BASOS ABS: 42 {cells}/uL (ref 0–200)
Basophils Relative: 1 %
EOS ABS: 126 {cells}/uL (ref 15–500)
Eosinophils Relative: 3 %
HEMATOCRIT: 31.1 % — AB (ref 38.5–50.0)
Hemoglobin: 9.9 g/dL — ABNORMAL LOW (ref 13.2–17.1)
LYMPHS PCT: 23 %
Lymphs Abs: 966 cells/uL (ref 850–3900)
MCH: 27.5 pg (ref 27.0–33.0)
MCHC: 31.8 g/dL — AB (ref 32.0–36.0)
MCV: 86.4 fL (ref 80.0–100.0)
MONO ABS: 504 {cells}/uL (ref 200–950)
MPV: 9.6 fL (ref 7.5–12.5)
Monocytes Relative: 12 %
NEUTROS PCT: 61 %
Neutro Abs: 2562 cells/uL (ref 1500–7800)
Platelets: 171 10*3/uL (ref 140–400)
RBC: 3.6 MIL/uL — ABNORMAL LOW (ref 4.20–5.80)
RDW: 16.8 % — AB (ref 11.0–15.0)
WBC: 4.2 10*3/uL (ref 3.8–10.8)

## 2016-08-12 LAB — BASIC METABOLIC PANEL
BUN: 70 mg/dL — AB (ref 7–25)
CHLORIDE: 100 mmol/L (ref 98–110)
CO2: 25 mmol/L (ref 20–31)
Calcium: 8.9 mg/dL (ref 8.6–10.3)
Creat: 1.67 mg/dL — ABNORMAL HIGH (ref 0.70–1.11)
GLUCOSE: 81 mg/dL (ref 65–99)
POTASSIUM: 4.9 mmol/L (ref 3.5–5.3)
Sodium: 135 mmol/L (ref 135–146)

## 2016-08-12 NOTE — Patient Instructions (Signed)
Medication Instructions:  Your physician recommends that you continue on your current medications as directed. Please refer to the Current Medication list given to you today.   Labwork: Bmet and Cbc today  Testing/Procedures: None ordered  Follow-Up: Your physician recommends that you schedule a follow-up appointment in: 3 months with Dr.Varanasi   Any Other Special Instructions Will Be Listed Below (If Applicable).     If you need a refill on your cardiac medications before your next appointment, please call your pharmacy.

## 2016-08-12 NOTE — Progress Notes (Signed)
08/12/2016 Eddie Park   1927-05-10  [id number]  Primary Physician Gerrit Heck, MD Primary Cardiologist: Dr. Irish Lack    Reason for Visit/CC: [facility] f/u for Acute on chronic Systolic + diastolic CHF  HPI:  Eddie Park a 82 y.o.malewith history of CAD s/p MI 1995 (further details unknown), HTN, severe mitral valve regurgitation, mod-severe aortic regurgitaiton, permanent atrial fib on Coumadin, permanent atrial fib withtachy/brady syndrome (initial implant 2007, last gen change 2013 with BSX dual chamber PPM), prostate CA, seizure disorder, chronic combined CHF (EF 45-50% in 2014), CKD stage III (per labs), hyponatremia, chronic anemia. He presented to the ED on 07/22/16 with hypoxia and CHF exacerbation. He was admitted by IM and Cardiology was consulted.  F/u echo showed little change in systolic function. EF was 40-45%. He was treated with IV diuretics. Symptoms improved. He was later changed to PO diuretics. Home lasix was changed to torsemide given better GI absorption. Low dose ACE-I and spironolactone was added. His BB was also held given issues with bradycardia and low BP. His Coumadin was continued but there was question regarding whether or not this should be discontinued given issues with hypotension and concerns regarding fall risk.  He presents back to clinic today for post hospital f/u. It was recommended that he get a f/u BMP this visit to reassess renal function, given change in diuretic + ACE-I therapy and spironolactone. He is here with his wife. He has done fairly well since discharge. He denies any recurrent dyspnea. No weight gain but he continues to note weight loss. No LEE. He has been fully compliant with meds and avoiding sodium. He denies any abnormal bleeding. No falls. He is enrolled in home health PT and his wife feels that he is getting stronger. We discussed Coumadin. He and his wife understands risk of severe bleed if traumatic fall.  They understand this risk and want to continue, as they feel he is improving with home PT. His INRs are followed in our Coumadin clinic.   No outpatient prescriptions have been marked as taking for the 08/12/16 encounter (Appointment) with Consuelo Pandy, PA-C.   No Known Allergies Past Medical History:  Diagnosis Date  . Anticoagulated on Coumadin   . Anxiety   . Aortic insufficiency    a. mod-severe by echo 2014.  [name] Chronic anemia   . Chronic combined systolic and diastolic CHF (congestive heart failure) ([place])   . CKD (chronic kidney disease), stage III   . Coronary artery disease    a. s/p MI 1995.  [name] Hypertension   . Hyponatremia   . Mitral valve regurgitation   . Permanent atrial fibrillation ([place])   . Prostate cancer ([place]) 2002  . Seizure disorder ([place])   . Severe mitral regurgitation   . Tachycardia-bradycardia syndrome ([place])    a. w/ sycope - initial implant 2007, last gen change 2013 with BSX dual chamber PPM.   Family History  Problem Relation Age of Onset  . Colon polyps Brother   . Heart attack Father   . Heart attack Brother    Past Surgical History:  Procedure Laterality Date  . artificial urinary sphincter    . PACEMAKER INSERTION  04/29/06   Generator change ([facility]) by Dr Rayann Heman 06/21/12  . PERMANENT PACEMAKER GENERATOR CHANGE N/A 06/08/2012   Procedure: PERMANENT PACEMAKER GENERATOR CHANGE;  Surgeon: Thompson Grayer, MD;  Location: [facility] CATH LAB;  Service: Cardiovascular;  Laterality: N/A;  . PROSTATE SURGERY     Social History  Social History  . Marital status: Married    Spouse name: N/A  . Number of children: N/A  . Years of education: N/A   Occupational History  . Not on file.   Social History Main Topics  . Smoking status: Never Smoker  . Smokeless tobacco: Never Used  . Alcohol use No  . Drug use: No  . Sexual activity: Not on file   Other Topics Concern  . Not on file   Social History Narrative   Lives in [place]   Retired  Furniture conservator/restorer     Review of Systems: General: negative for chills, fever, night sweats or weight changes.  Cardiovascular: negative for chest pain, dyspnea on exertion, edema, orthopnea, palpitations, paroxysmal nocturnal dyspnea or shortness of breath Dermatological: negative for rash Respiratory: negative for cough or wheezing Urologic: negative for hematuria Abdominal: negative for nausea, vomiting, diarrhea, bright red blood per rectum, melena, or hematemesis Neurologic: negative for visual changes, syncope, or dizziness All other systems reviewed and are otherwise negative except as noted above.   Physical Exam:  There were no vitals taken for this visit.  General appearance: alert, cooperative, no distress and elderly and frail Neck: no carotid bruit and no JVD Lungs: clear to auscultation bilaterally Heart: regular rate and rhythm, S1, S2 normal, no murmur, click, rub or gallop Extremities: extremities normal, atraumatic, no cyanosis or edema Pulses: 2+ and symmetric Skin: Skin color, texture, turgor normal. No rashes or lesions Neurologic: Grossly normal  EKG electronic ventricular pacemaker 63 bpm.   ASSESSMENT AND PLAN:   1. Chroniccombined CHF - Appears euvolemic on physical edam. no dyspnea. No LEE. No weigh gain. Continue torsemide, lisinopril and spironolactone. His BB was discontinued due to soft BP and bradycardia. He has a PPM however his BP will not allow resumption of BB. Will continue to hold. We will check a f/u BMP today to reasess renal function and K.  2. Severe MR/mod-severe AI - not felt to be a surgical candidate per Dr. Irish Lack.  3. CKD stage III/hyponatremia - f/u BMP today.   4. Essential HTN - soft but stable in the low 161W systolic. Asymptomatic.   5. Permanent atrial fib - rate is controlled. He remains on coumadin. No falls since discharge. He is working with home PT. We discussed Coumadin. He and his wife understands risk of severe bleed if  traumatic fall. They understand this risk and want to continue, as they feel he is improving with home PT. His INRs are followed in our Coumadin clinic.   PLAN  F/u with Dr. Irish Lack in 3 months.   Brittainy Simmons PA-C 08/12/2016 2:25 PM

## 2016-08-13 ENCOUNTER — Telehealth: Payer: Self-pay | Admitting: Interventional Cardiology

## 2016-08-13 ENCOUNTER — Telehealth: Payer: Self-pay | Admitting: *Deleted

## 2016-08-13 DIAGNOSIS — I130 Hypertensive heart and chronic kidney disease with heart failure and stage 1 through stage 4 chronic kidney disease, or unspecified chronic kidney disease: Secondary | ICD-10-CM | POA: Diagnosis not present

## 2016-08-13 DIAGNOSIS — G40909 Epilepsy, unspecified, not intractable, without status epilepticus: Secondary | ICD-10-CM | POA: Diagnosis not present

## 2016-08-13 DIAGNOSIS — I5042 Chronic combined systolic (congestive) and diastolic (congestive) heart failure: Secondary | ICD-10-CM | POA: Diagnosis not present

## 2016-08-13 DIAGNOSIS — N183 Chronic kidney disease, stage 3 (moderate): Secondary | ICD-10-CM | POA: Diagnosis not present

## 2016-08-13 DIAGNOSIS — I2510 Atherosclerotic heart disease of native coronary artery without angina pectoris: Secondary | ICD-10-CM | POA: Diagnosis not present

## 2016-08-13 DIAGNOSIS — I10 Essential (primary) hypertension: Secondary | ICD-10-CM

## 2016-08-13 DIAGNOSIS — I4891 Unspecified atrial fibrillation: Secondary | ICD-10-CM | POA: Diagnosis not present

## 2016-08-13 NOTE — Telephone Encounter (Signed)
-----   Message from Consuelo Pandy, [place] sent at 08/13/2016  8:00 AM EST ----- SCr and BUN significantly elevated compared to hospital numbers. He may be dehydrated. Stop spironolactone. Continue all other meds Repeat  BMP in 7-10 days.

## 2016-08-13 NOTE — Telephone Encounter (Signed)
Verbal order given for bmet in 7-10 days.

## 2016-08-13 NOTE — Telephone Encounter (Signed)
Pt wife, [name], [place] on file, has been made aware of pts lab results. She will d/c Spironolactone and bring pt in for repeat bmet 08/20/16. Order in [place].

## 2016-08-13 NOTE — Telephone Encounter (Signed)
New message    [facility] calling  - bmet lab can be drawn today - need an order.

## 2016-08-18 DIAGNOSIS — I130 Hypertensive heart and chronic kidney disease with heart failure and stage 1 through stage 4 chronic kidney disease, or unspecified chronic kidney disease: Secondary | ICD-10-CM | POA: Diagnosis not present

## 2016-08-18 DIAGNOSIS — I2510 Atherosclerotic heart disease of native coronary artery without angina pectoris: Secondary | ICD-10-CM | POA: Diagnosis not present

## 2016-08-18 DIAGNOSIS — I4891 Unspecified atrial fibrillation: Secondary | ICD-10-CM | POA: Diagnosis not present

## 2016-08-18 DIAGNOSIS — N183 Chronic kidney disease, stage 3 (moderate): Secondary | ICD-10-CM | POA: Diagnosis not present

## 2016-08-18 DIAGNOSIS — G40909 Epilepsy, unspecified, not intractable, without status epilepticus: Secondary | ICD-10-CM | POA: Diagnosis not present

## 2016-08-18 DIAGNOSIS — I5042 Chronic combined systolic (congestive) and diastolic (congestive) heart failure: Secondary | ICD-10-CM | POA: Diagnosis not present

## 2016-08-20 ENCOUNTER — Ambulatory Visit (INDEPENDENT_AMBULATORY_CARE_PROVIDER_SITE_OTHER): Payer: Medicare Other | Admitting: Cardiology

## 2016-08-20 ENCOUNTER — Encounter: Payer: Self-pay | Admitting: Interventional Cardiology

## 2016-08-20 ENCOUNTER — Other Ambulatory Visit: Payer: Medicare Other

## 2016-08-20 DIAGNOSIS — I5042 Chronic combined systolic (congestive) and diastolic (congestive) heart failure: Secondary | ICD-10-CM | POA: Diagnosis not present

## 2016-08-20 DIAGNOSIS — I482 Chronic atrial fibrillation: Secondary | ICD-10-CM

## 2016-08-20 DIAGNOSIS — I2510 Atherosclerotic heart disease of native coronary artery without angina pectoris: Secondary | ICD-10-CM | POA: Diagnosis not present

## 2016-08-20 DIAGNOSIS — I4891 Unspecified atrial fibrillation: Secondary | ICD-10-CM | POA: Diagnosis not present

## 2016-08-20 DIAGNOSIS — I130 Hypertensive heart and chronic kidney disease with heart failure and stage 1 through stage 4 chronic kidney disease, or unspecified chronic kidney disease: Secondary | ICD-10-CM | POA: Diagnosis not present

## 2016-08-20 DIAGNOSIS — Z5181 Encounter for therapeutic drug level monitoring: Secondary | ICD-10-CM

## 2016-08-20 DIAGNOSIS — G40909 Epilepsy, unspecified, not intractable, without status epilepticus: Secondary | ICD-10-CM | POA: Diagnosis not present

## 2016-08-20 DIAGNOSIS — N183 Chronic kidney disease, stage 3 (moderate): Secondary | ICD-10-CM | POA: Diagnosis not present

## 2016-08-20 DIAGNOSIS — I4821 Permanent atrial fibrillation: Secondary | ICD-10-CM

## 2016-08-20 LAB — POCT INR: INR: 2.5

## 2016-08-25 DIAGNOSIS — I4891 Unspecified atrial fibrillation: Secondary | ICD-10-CM | POA: Diagnosis not present

## 2016-08-25 DIAGNOSIS — I2510 Atherosclerotic heart disease of native coronary artery without angina pectoris: Secondary | ICD-10-CM | POA: Diagnosis not present

## 2016-08-25 DIAGNOSIS — G40909 Epilepsy, unspecified, not intractable, without status epilepticus: Secondary | ICD-10-CM | POA: Diagnosis not present

## 2016-08-25 DIAGNOSIS — I5042 Chronic combined systolic (congestive) and diastolic (congestive) heart failure: Secondary | ICD-10-CM | POA: Diagnosis not present

## 2016-08-25 DIAGNOSIS — I130 Hypertensive heart and chronic kidney disease with heart failure and stage 1 through stage 4 chronic kidney disease, or unspecified chronic kidney disease: Secondary | ICD-10-CM | POA: Diagnosis not present

## 2016-08-25 DIAGNOSIS — N183 Chronic kidney disease, stage 3 (moderate): Secondary | ICD-10-CM | POA: Diagnosis not present

## 2016-08-27 DIAGNOSIS — I5042 Chronic combined systolic (congestive) and diastolic (congestive) heart failure: Secondary | ICD-10-CM | POA: Diagnosis not present

## 2016-08-27 DIAGNOSIS — I4891 Unspecified atrial fibrillation: Secondary | ICD-10-CM | POA: Diagnosis not present

## 2016-08-27 DIAGNOSIS — I130 Hypertensive heart and chronic kidney disease with heart failure and stage 1 through stage 4 chronic kidney disease, or unspecified chronic kidney disease: Secondary | ICD-10-CM | POA: Diagnosis not present

## 2016-08-27 DIAGNOSIS — I2510 Atherosclerotic heart disease of native coronary artery without angina pectoris: Secondary | ICD-10-CM | POA: Diagnosis not present

## 2016-08-27 DIAGNOSIS — N183 Chronic kidney disease, stage 3 (moderate): Secondary | ICD-10-CM | POA: Diagnosis not present

## 2016-08-27 DIAGNOSIS — G40909 Epilepsy, unspecified, not intractable, without status epilepticus: Secondary | ICD-10-CM | POA: Diagnosis not present

## 2016-09-01 ENCOUNTER — Other Ambulatory Visit: Payer: Self-pay | Admitting: Interventional Cardiology

## 2016-09-11 ENCOUNTER — Ambulatory Visit (INDEPENDENT_AMBULATORY_CARE_PROVIDER_SITE_OTHER): Payer: Medicare Other | Admitting: Internal Medicine

## 2016-09-11 DIAGNOSIS — I482 Chronic atrial fibrillation: Secondary | ICD-10-CM

## 2016-09-11 DIAGNOSIS — I130 Hypertensive heart and chronic kidney disease with heart failure and stage 1 through stage 4 chronic kidney disease, or unspecified chronic kidney disease: Secondary | ICD-10-CM | POA: Diagnosis not present

## 2016-09-11 DIAGNOSIS — I5042 Chronic combined systolic (congestive) and diastolic (congestive) heart failure: Secondary | ICD-10-CM | POA: Diagnosis not present

## 2016-09-11 DIAGNOSIS — Z5181 Encounter for therapeutic drug level monitoring: Secondary | ICD-10-CM

## 2016-09-11 DIAGNOSIS — G40909 Epilepsy, unspecified, not intractable, without status epilepticus: Secondary | ICD-10-CM | POA: Diagnosis not present

## 2016-09-11 DIAGNOSIS — N183 Chronic kidney disease, stage 3 (moderate): Secondary | ICD-10-CM | POA: Diagnosis not present

## 2016-09-11 DIAGNOSIS — I2510 Atherosclerotic heart disease of native coronary artery without angina pectoris: Secondary | ICD-10-CM | POA: Diagnosis not present

## 2016-09-11 DIAGNOSIS — I4891 Unspecified atrial fibrillation: Secondary | ICD-10-CM | POA: Diagnosis not present

## 2016-09-11 DIAGNOSIS — I4821 Permanent atrial fibrillation: Secondary | ICD-10-CM

## 2016-09-11 LAB — POCT INR: INR: 3.1

## 2016-09-17 DIAGNOSIS — I5042 Chronic combined systolic (congestive) and diastolic (congestive) heart failure: Secondary | ICD-10-CM | POA: Diagnosis not present

## 2016-09-26 ENCOUNTER — Ambulatory Visit (INDEPENDENT_AMBULATORY_CARE_PROVIDER_SITE_OTHER): Payer: Medicare Other | Admitting: Cardiovascular Disease

## 2016-09-26 DIAGNOSIS — I4821 Permanent atrial fibrillation: Secondary | ICD-10-CM

## 2016-09-26 DIAGNOSIS — Z5181 Encounter for therapeutic drug level monitoring: Secondary | ICD-10-CM

## 2016-09-26 DIAGNOSIS — I4891 Unspecified atrial fibrillation: Secondary | ICD-10-CM | POA: Diagnosis not present

## 2016-09-26 DIAGNOSIS — I5042 Chronic combined systolic (congestive) and diastolic (congestive) heart failure: Secondary | ICD-10-CM | POA: Diagnosis not present

## 2016-09-26 DIAGNOSIS — I482 Chronic atrial fibrillation: Secondary | ICD-10-CM

## 2016-09-26 DIAGNOSIS — I130 Hypertensive heart and chronic kidney disease with heart failure and stage 1 through stage 4 chronic kidney disease, or unspecified chronic kidney disease: Secondary | ICD-10-CM | POA: Diagnosis not present

## 2016-09-26 DIAGNOSIS — I2510 Atherosclerotic heart disease of native coronary artery without angina pectoris: Secondary | ICD-10-CM | POA: Diagnosis not present

## 2016-09-26 DIAGNOSIS — N183 Chronic kidney disease, stage 3 (moderate): Secondary | ICD-10-CM | POA: Diagnosis not present

## 2016-09-26 DIAGNOSIS — G40909 Epilepsy, unspecified, not intractable, without status epilepticus: Secondary | ICD-10-CM | POA: Diagnosis not present

## 2016-09-26 LAB — POCT INR: INR: 2.1

## 2016-09-27 DIAGNOSIS — I5042 Chronic combined systolic (congestive) and diastolic (congestive) heart failure: Secondary | ICD-10-CM | POA: Diagnosis not present

## 2016-09-27 DIAGNOSIS — N183 Chronic kidney disease, stage 3 (moderate): Secondary | ICD-10-CM | POA: Diagnosis not present

## 2016-09-27 DIAGNOSIS — G40909 Epilepsy, unspecified, not intractable, without status epilepticus: Secondary | ICD-10-CM | POA: Diagnosis not present

## 2016-09-27 DIAGNOSIS — I4891 Unspecified atrial fibrillation: Secondary | ICD-10-CM | POA: Diagnosis not present

## 2016-09-27 DIAGNOSIS — I130 Hypertensive heart and chronic kidney disease with heart failure and stage 1 through stage 4 chronic kidney disease, or unspecified chronic kidney disease: Secondary | ICD-10-CM | POA: Diagnosis not present

## 2016-09-27 DIAGNOSIS — I2510 Atherosclerotic heart disease of native coronary artery without angina pectoris: Secondary | ICD-10-CM | POA: Diagnosis not present

## 2016-09-30 DIAGNOSIS — R531 Weakness: Secondary | ICD-10-CM | POA: Diagnosis not present

## 2016-10-01 ENCOUNTER — Ambulatory Visit (INDEPENDENT_AMBULATORY_CARE_PROVIDER_SITE_OTHER): Payer: Medicare Other | Admitting: *Deleted

## 2016-10-01 DIAGNOSIS — I495 Sick sinus syndrome: Secondary | ICD-10-CM | POA: Diagnosis not present

## 2016-10-01 NOTE — Progress Notes (Signed)
Remote pacemaker transmission.   

## 2016-10-02 ENCOUNTER — Encounter: Payer: Self-pay | Admitting: Cardiology

## 2016-10-16 ENCOUNTER — Encounter: Payer: Self-pay | Admitting: Cardiology

## 2016-10-16 LAB — CUP PACEART REMOTE DEVICE CHECK
Battery Remaining Longevity: 102 mo
Implantable Lead Implant Date: 20130917
Implantable Lead Location: 753860
Implantable Lead Model: 4456
Implantable Lead Serial Number: 484382
Lead Channel Impedance Value: 382 Ohm
Lead Channel Setting Pacing Amplitude: 2.4 V
Lead Channel Setting Pacing Pulse Width: 0.4 ms
MDC IDC LEAD IMPLANT DT: 20130917
MDC IDC LEAD LOCATION: 753859
MDC IDC LEAD SERIAL: 448722
MDC IDC MSMT BATTERY REMAINING PERCENTAGE: 100 %
MDC IDC MSMT LEADCHNL RV IMPEDANCE VALUE: 378 Ohm
MDC IDC PG IMPLANT DT: 20130917
MDC IDC SESS DTM: 20180110062100
MDC IDC SET LEADCHNL RV SENSING SENSITIVITY: 2.5 mV
MDC IDC STAT BRADY RA PERCENT PACED: 100 %
MDC IDC STAT BRADY RV PERCENT PACED: 95 %
Pulse Gen Serial Number: 111635

## 2016-10-17 ENCOUNTER — Ambulatory Visit (INDEPENDENT_AMBULATORY_CARE_PROVIDER_SITE_OTHER): Payer: Medicare Other | Admitting: *Deleted

## 2016-10-17 DIAGNOSIS — Z7901 Long term (current) use of anticoagulants: Secondary | ICD-10-CM

## 2016-10-17 DIAGNOSIS — I4891 Unspecified atrial fibrillation: Secondary | ICD-10-CM | POA: Diagnosis not present

## 2016-10-17 DIAGNOSIS — I482 Chronic atrial fibrillation: Secondary | ICD-10-CM

## 2016-10-17 DIAGNOSIS — Z5181 Encounter for therapeutic drug level monitoring: Secondary | ICD-10-CM

## 2016-10-17 DIAGNOSIS — I4821 Permanent atrial fibrillation: Secondary | ICD-10-CM

## 2016-10-17 LAB — POCT INR: INR: 2

## 2016-11-10 NOTE — Progress Notes (Signed)
Patient ID: Eddie Park, male   DOB: 01/24/1927, 83 y.o.   MRN: [id number]     Cardiology Office Note   Date:  11/14/2016   ID:  Eddie Park, DOB 1927/08/20, MRN [id number]  PCP:  Gerrit Heck, MD    No chief complaint on file. AFib   Wt Readings from Last 3 Encounters:  11/14/16 115 lb 12.8 oz (52.5 kg)  08/12/16 111 lb 12.8 oz (50.7 kg)  07/28/16 134 lb 4.2 oz (60.9 kg)       History of Present Illness: Eddie Park is a 83 y.o. male  with AFib, mitral regurgitation, cardiomyopathy, and pacer dependent. He is now in AFib 100% of the time by pacer check. EF 45% in 2011.  Echo in 11/17 showed: Left ventricle: The cavity size was normal. There was mild   concentric hypertrophy. Systolic function was mildly to   moderately reduced. The estimated ejection fraction was in the   range of 40% to 45%. Mild diffuse hypokinesis with no   identifiable regional variations. The study is not technically   sufficient to allow evaluation of LV diastolic function. - Ventricular septum: Septal motion showed paradox. These changes   are consistent with right ventricular pacing. - Aortic valve: There was moderate regurgitation directed centrally   in the LVOT. - Mitral valve: There was mild regurgitation directed centrally. - Left atrium: The atrium was severely dilated. - Right ventricle: The cavity size was mildly dilated. - Tricuspid valve: There was moderate regurgitation. - Pulmonary arteries: Systolic pressure was mildly increased. PA   peak pressure: 39 mm Hg (S). - Pericardium, extracardiac: A small pericardial effusion was   identified posterior to the heart. The fluid had no internal   echoes.There was no evidence of hemodynamic compromise. There was   a left pleural effusion.  In the past, he has had issues with falls, but this decreased with less Lasix.  He uses a cane.    Still taking Coumadin.  No other bleeding issues.    Fluid management has been  stable of late.  In 10/17, he required increased diuretics for LE edema and more SHOB.  He was hospitalized in 11/17 and diuresed 3.6 L.   Since then, no LE edema. Breathing has been stable.  In the past few days, he has restarted walking in the yard.  He walks slowly.  No SHOB.     Past Medical History:  Diagnosis Date  . Anticoagulated on Coumadin   . Anxiety   . Aortic insufficiency    a. mod-severe by echo 2014.  [name] Chronic anemia   . Chronic combined systolic and diastolic CHF (congestive heart failure) ([place])   . CKD (chronic kidney disease), stage III   . Coronary artery disease    a. s/p MI 1995.  [name] Hypertension   . Hyponatremia   . Mitral valve regurgitation   . Permanent atrial fibrillation ([place])   . Prostate cancer ([place]) 2002  . Seizure disorder ([place])   . Severe mitral regurgitation   . Tachycardia-bradycardia syndrome ([place])    a. w/ sycope - initial implant 2007, last gen change 2013 with BSX dual chamber PPM.    Past Surgical History:  Procedure Laterality Date  . artificial urinary sphincter    . PACEMAKER INSERTION  04/29/06   Generator change (BSc) by Dr Rayann Heman 06/21/12  . PERMANENT PACEMAKER GENERATOR CHANGE N/A 06/08/2012   Procedure: PERMANENT PACEMAKER GENERATOR CHANGE;  Surgeon: Thompson Grayer, MD;  Location: [facility] LAB;  Service: Cardiovascular;  Laterality: N/A;  . PROSTATE SURGERY       Current Outpatient Prescriptions  Medication Sig Dispense Refill  . cholecalciferol (VITAMIN D) 1000 UNITS tablet Take 2,000 Units by mouth daily.    . fish oil-omega-3 fatty acids 1000 MG capsule Take 2 g by mouth daily.    [name] JANTOVEN 5 MG tablet TAKE AS DIRECTED BY COUMADIN CLINIC 150 tablet 1  . lisinopril (PRINIVIL,ZESTRIL) 2.5 MG tablet Take 1 tablet (2.5 mg total) by mouth daily. 30 tablet 3  . Multiple Vitamin (MULTIVITAMIN WITH MINERALS) TABS Take 1 tablet by mouth daily.    [name] PHENobarbital (LUMINAL) 97.2 MG tablet Take 97.2 mg by mouth at bedtime.    .  sertraline (ZOLOFT) 100 MG tablet Take 100 mg by mouth daily.    [name] torsemide (DEMADEX) 20 MG tablet Take 1 tablet (20 mg total) by mouth daily. 30 tablet 6  . vitamin B-12 (CYANOCOBALAMIN) 1000 MCG tablet Take 1,000 mcg by mouth daily.    . potassium chloride 20 MEQ TBCR Take 20 mEq by mouth daily. (Patient not taking: Reported on 11/14/2016) 30 tablet 0   No current facility-administered medications for this visit.     Allergies:   Patient has no known allergies.    Social History:  The patient  reports that he has never smoked. He has never used smokeless tobacco. He reports that he does not drink alcohol or use drugs.   Family History:  The patient's family history includes Colon polyps in his brother; Heart attack in his brother and father.    ROS:  Please see the history of present illness.   Otherwise, review of systems are positive for bruising; weight loss since last visit with me.   All other systems are reviewed and negative.    PHYSICAL EXAM: VS:  BP 110/68 (BP Location: Right Arm, Patient Position: Sitting, Cuff Size: Normal)   Pulse 66   Ht 5\' 6"  (1.676 m)   Wt 115 lb 12.8 oz (52.5 kg)   SpO2 98%   BMI 18.69 kg/m  , BMI Body mass index is 18.69 kg/m. GEN: Well nourished, well developed, in no acute distress  HEENT: normal  Neck: no JVD, carotid bruits, or masses Cardiac: RRR; 2/6 systolic murmurs, rubs, or gallops,no edema  Respiratory:  clear to auscultation bilaterally, normal work of breathing GI: soft, nontender, nondistended, + BS MS: no deformity or atrophy  Skin: warm and dry, no rash, few scattered bruises Neuro:  Strength and sensation are intact Psych: euthymic mood, full affect    Recent Labs: 07/18/2016: TSH 0.92 07/22/2016: ALT 15 07/26/2016: B Natriuretic Peptide 1,450.6 08/12/2016: BUN 70; Creat 1.67; Hemoglobin 9.9; Platelets 171; Potassium 4.9; Sodium 135   Lipid Panel    Component Value Date/Time   CHOL  09/26/2009 2344    142          ATP III CLASSIFICATION:  <200     mg/dL   Desirable  200-239  mg/dL   Borderline High  >=240    mg/dL   High          TRIG 65 09/26/2009 2344   HDL 42 09/26/2009 2344   CHOLHDL 3.4 09/26/2009 2344   VLDL 13 09/26/2009 2344   LDLCALC  09/26/2009 2344    87        Total Cholesterol/HDL:CHD Risk Coronary Heart Disease Risk Table  Men   Women  1/2 Average Risk   3.4   3.3  Average Risk       5.0   4.4  2 X Average Risk   9.6   7.1  3 X Average Risk  23.4   11.0        Use the calculated Patient Ratio above and the CHD Risk Table to determine the patient's CHD Risk.        ATP III CLASSIFICATION (LDL):  <100     mg/dL   Optimal  100-129  mg/dL   Near or Above                    Optimal  130-159  mg/dL   Borderline  160-189  mg/dL   High  >190     mg/dL   Very High     Other studies Reviewed: Additional studies/ records that were reviewed today with results demonstrating: EF 45-50%.   ASSESSMENT AND PLAN:  1. AFib: Discussed Coumadin.  He has been more stable of late.   Rate controlled.  We discussed possibility of continuing versus coming off of Coumadin. The patient and his wife continue to prefer that he stay on Coumadin at this point. He has been more stable on his feet. If he is careful, he will avoid falls. No recent falls.  Continue current rate control meds.  INR was 1.9 today.  Dose was adjusted.  2. HTN: BP stable.  Continue current meds.   3. Chronic diastolic heart failure: Appears euvolemic. Continue current diuretics. 4. Mitral regurgitation: Noted to be severe in the past. Mild on the last echo.  At this point, I don't think he would be a candidate for any type of surgical repair. 5. SHOB: Improved since being in the hopsital.     Current medicines are reviewed at length with the patient today.  The patient concerns regarding his medicines were addressed.  The following changes have been made:  No change  Labs/ tests ordered today  include:  No orders of the defined types were placed in this encounter.   Recommend 150 minutes/week of aerobic exercise Low fat, low carb, high fiber diet recommended  Disposition:   FU in 6 months    Signed, Larae Grooms, MD  11/14/2016 11:54 AM    [facility] [place] [address] [place], [place], [place]  [address] Phone: [phone]; Fax: [phone]

## 2016-11-14 ENCOUNTER — Encounter: Payer: Self-pay | Admitting: Interventional Cardiology

## 2016-11-14 ENCOUNTER — Ambulatory Visit (INDEPENDENT_AMBULATORY_CARE_PROVIDER_SITE_OTHER): Payer: Medicare Other | Admitting: Interventional Cardiology

## 2016-11-14 ENCOUNTER — Ambulatory Visit (INDEPENDENT_AMBULATORY_CARE_PROVIDER_SITE_OTHER): Payer: Medicare Other | Admitting: *Deleted

## 2016-11-14 VITALS — BP 110/68 | HR 66 | Ht 66.0 in | Wt 115.8 lb

## 2016-11-14 DIAGNOSIS — I4891 Unspecified atrial fibrillation: Secondary | ICD-10-CM | POA: Diagnosis not present

## 2016-11-14 DIAGNOSIS — I10 Essential (primary) hypertension: Secondary | ICD-10-CM | POA: Diagnosis not present

## 2016-11-14 DIAGNOSIS — I482 Chronic atrial fibrillation: Secondary | ICD-10-CM

## 2016-11-14 DIAGNOSIS — Z7901 Long term (current) use of anticoagulants: Secondary | ICD-10-CM | POA: Diagnosis not present

## 2016-11-14 DIAGNOSIS — I5032 Chronic diastolic (congestive) heart failure: Secondary | ICD-10-CM

## 2016-11-14 DIAGNOSIS — I4821 Permanent atrial fibrillation: Secondary | ICD-10-CM

## 2016-11-14 DIAGNOSIS — Z5181 Encounter for therapeutic drug level monitoring: Secondary | ICD-10-CM

## 2016-11-14 LAB — POCT INR: INR: 1.9

## 2016-11-14 NOTE — Patient Instructions (Signed)
Medication Instructions:  Your physician recommends that you continue on your current medications as directed. Please refer to the Current Medication list given to you today.   Labwork: LABS TODAY: BMET, CBC  Testing/Procedures: None ordered  Follow-Up: Your physician wants you to follow-up in: 6 months with Dr. Irish Lack. You will receive a reminder letter in the mail two months in advance. If you don't receive a letter, please call our office to schedule the follow-up appointment.   Any Other Special Instructions Will Be Listed Below (If Applicable).     If you need a refill on your cardiac medications before your next appointment, please call your pharmacy.

## 2016-11-15 LAB — CBC
HEMOGLOBIN: 8.8 g/dL — AB (ref 13.0–17.7)
Hematocrit: 26.2 % — ABNORMAL LOW (ref 37.5–51.0)
MCH: 31.5 pg (ref 26.6–33.0)
MCHC: 33.6 g/dL (ref 31.5–35.7)
MCV: 94 fL (ref 79–97)
PLATELETS: 130 10*3/uL — AB (ref 150–379)
RBC: 2.79 x10E6/uL — AB (ref 4.14–5.80)
RDW: 15.8 % — ABNORMAL HIGH (ref 12.3–15.4)
WBC: 4.1 10*3/uL (ref 3.4–10.8)

## 2016-11-15 LAB — BASIC METABOLIC PANEL
BUN / CREAT RATIO: 25 — AB (ref 10–24)
BUN: 40 mg/dL — AB (ref 8–27)
CALCIUM: 9 mg/dL (ref 8.6–10.2)
CHLORIDE: 96 mmol/L (ref 96–106)
CO2: 26 mmol/L (ref 18–29)
Creatinine, Ser: 1.62 mg/dL — ABNORMAL HIGH (ref 0.76–1.27)
GFR calc Af Amer: 43 mL/min/{1.73_m2} — ABNORMAL LOW (ref >59)
GFR calc non Af Amer: 38 mL/min/{1.73_m2} — ABNORMAL LOW (ref >59)
GLUCOSE: 86 mg/dL (ref 65–99)
Potassium: 4.3 mmol/L (ref 3.5–5.2)
Sodium: 137 mmol/L (ref 134–144)

## 2016-11-26 ENCOUNTER — Encounter: Payer: Self-pay | Admitting: Interventional Cardiology

## 2016-11-27 MED ORDER — LISINOPRIL 2.5 MG PO TABS: 2.5000 mg | ORAL_TABLET | Freq: Every day | ORAL | 0 refills | Status: DC

## 2016-12-02 ENCOUNTER — Ambulatory Visit (INDEPENDENT_AMBULATORY_CARE_PROVIDER_SITE_OTHER): Payer: Medicare Other

## 2016-12-02 DIAGNOSIS — Z5181 Encounter for therapeutic drug level monitoring: Secondary | ICD-10-CM

## 2016-12-02 DIAGNOSIS — I4891 Unspecified atrial fibrillation: Secondary | ICD-10-CM

## 2016-12-02 DIAGNOSIS — Z7901 Long term (current) use of anticoagulants: Secondary | ICD-10-CM | POA: Diagnosis not present

## 2016-12-02 DIAGNOSIS — I4821 Permanent atrial fibrillation: Secondary | ICD-10-CM

## 2016-12-02 DIAGNOSIS — I482 Chronic atrial fibrillation: Secondary | ICD-10-CM

## 2016-12-02 LAB — POCT INR: INR: 2.1

## 2016-12-23 ENCOUNTER — Ambulatory Visit (INDEPENDENT_AMBULATORY_CARE_PROVIDER_SITE_OTHER): Payer: Medicare Other | Admitting: *Deleted

## 2016-12-23 DIAGNOSIS — Z7901 Long term (current) use of anticoagulants: Secondary | ICD-10-CM

## 2016-12-23 DIAGNOSIS — I4891 Unspecified atrial fibrillation: Secondary | ICD-10-CM | POA: Diagnosis not present

## 2016-12-23 DIAGNOSIS — I482 Chronic atrial fibrillation: Secondary | ICD-10-CM

## 2016-12-23 DIAGNOSIS — Z5181 Encounter for therapeutic drug level monitoring: Secondary | ICD-10-CM

## 2016-12-23 DIAGNOSIS — I4821 Permanent atrial fibrillation: Secondary | ICD-10-CM

## 2016-12-23 LAB — POCT INR: INR: 6.5

## 2016-12-23 LAB — PROTIME-INR
INR: 5.6 — ABNORMAL HIGH (ref 0.8–1.2)
Prothrombin Time: 55.8 s — ABNORMAL HIGH (ref 9.1–12.0)

## 2016-12-30 ENCOUNTER — Ambulatory Visit (INDEPENDENT_AMBULATORY_CARE_PROVIDER_SITE_OTHER): Payer: Medicare Other | Admitting: *Deleted

## 2016-12-30 DIAGNOSIS — Z7901 Long term (current) use of anticoagulants: Secondary | ICD-10-CM | POA: Diagnosis not present

## 2016-12-30 DIAGNOSIS — I482 Chronic atrial fibrillation: Secondary | ICD-10-CM | POA: Diagnosis not present

## 2016-12-30 DIAGNOSIS — Z5181 Encounter for therapeutic drug level monitoring: Secondary | ICD-10-CM

## 2016-12-30 DIAGNOSIS — I4891 Unspecified atrial fibrillation: Secondary | ICD-10-CM

## 2016-12-30 DIAGNOSIS — I4821 Permanent atrial fibrillation: Secondary | ICD-10-CM

## 2016-12-30 LAB — POCT INR: INR: 1.9

## 2016-12-31 ENCOUNTER — Ambulatory Visit (INDEPENDENT_AMBULATORY_CARE_PROVIDER_SITE_OTHER): Payer: Medicare Other | Admitting: *Deleted

## 2016-12-31 DIAGNOSIS — I495 Sick sinus syndrome: Secondary | ICD-10-CM

## 2016-12-31 NOTE — Progress Notes (Signed)
Remote pacemaker transmission.   

## 2017-01-01 ENCOUNTER — Encounter: Payer: Self-pay | Admitting: Cardiology

## 2017-01-02 LAB — CUP PACEART REMOTE DEVICE CHECK
Battery Remaining Longevity: 102 mo
Battery Remaining Percentage: 100 %
Brady Statistic RA Percent Paced: 100 %
Date Time Interrogation Session: 20180411045200
Implantable Lead Implant Date: 20130917
Implantable Lead Location: 753859
Implantable Lead Location: 753860
Implantable Lead Model: 4456
Implantable Lead Serial Number: 448722
Implantable Lead Serial Number: 484382
Implantable Pulse Generator Implant Date: 20130917
Lead Channel Impedance Value: 376 Ohm
Lead Channel Impedance Value: 394 Ohm
Lead Channel Setting Pacing Pulse Width: 0.4 ms
MDC IDC LEAD IMPLANT DT: 20130917
MDC IDC PG SERIAL: 111635
MDC IDC SET LEADCHNL RV PACING AMPLITUDE: 2.4 V
MDC IDC SET LEADCHNL RV SENSING SENSITIVITY: 2.5 mV
MDC IDC STAT BRADY RV PERCENT PACED: 94 %

## 2017-01-14 ENCOUNTER — Ambulatory Visit (INDEPENDENT_AMBULATORY_CARE_PROVIDER_SITE_OTHER): Payer: Medicare Other | Admitting: *Deleted

## 2017-01-14 DIAGNOSIS — Z5181 Encounter for therapeutic drug level monitoring: Secondary | ICD-10-CM | POA: Diagnosis not present

## 2017-01-14 DIAGNOSIS — Z7901 Long term (current) use of anticoagulants: Secondary | ICD-10-CM | POA: Diagnosis not present

## 2017-01-14 DIAGNOSIS — I482 Chronic atrial fibrillation: Secondary | ICD-10-CM

## 2017-01-14 DIAGNOSIS — I4891 Unspecified atrial fibrillation: Secondary | ICD-10-CM

## 2017-01-14 DIAGNOSIS — I4821 Permanent atrial fibrillation: Secondary | ICD-10-CM

## 2017-01-14 LAB — POCT INR: INR: 2.9

## 2017-01-15 ENCOUNTER — Encounter: Payer: Self-pay | Admitting: Cardiology

## 2017-01-15 DIAGNOSIS — E0590 Thyrotoxicosis, unspecified without thyrotoxic crisis or storm: Secondary | ICD-10-CM | POA: Diagnosis not present

## 2017-02-04 ENCOUNTER — Ambulatory Visit (INDEPENDENT_AMBULATORY_CARE_PROVIDER_SITE_OTHER): Payer: Medicare Other | Admitting: *Deleted

## 2017-02-04 DIAGNOSIS — Z5181 Encounter for therapeutic drug level monitoring: Secondary | ICD-10-CM

## 2017-02-04 DIAGNOSIS — I4821 Permanent atrial fibrillation: Secondary | ICD-10-CM

## 2017-02-04 DIAGNOSIS — Z7901 Long term (current) use of anticoagulants: Secondary | ICD-10-CM

## 2017-02-04 DIAGNOSIS — I4891 Unspecified atrial fibrillation: Secondary | ICD-10-CM

## 2017-02-04 DIAGNOSIS — I482 Chronic atrial fibrillation: Secondary | ICD-10-CM

## 2017-02-04 LAB — POCT INR: INR: 2.5

## 2017-02-17 ENCOUNTER — Other Ambulatory Visit: Payer: Self-pay | Admitting: Interventional Cardiology

## 2017-02-22 ENCOUNTER — Other Ambulatory Visit: Payer: Self-pay | Admitting: Interventional Cardiology

## 2017-03-04 ENCOUNTER — Ambulatory Visit (INDEPENDENT_AMBULATORY_CARE_PROVIDER_SITE_OTHER): Payer: Medicare Other | Admitting: *Deleted

## 2017-03-04 DIAGNOSIS — I482 Chronic atrial fibrillation: Secondary | ICD-10-CM | POA: Diagnosis not present

## 2017-03-04 DIAGNOSIS — I4821 Permanent atrial fibrillation: Secondary | ICD-10-CM

## 2017-03-04 DIAGNOSIS — Z7901 Long term (current) use of anticoagulants: Secondary | ICD-10-CM | POA: Diagnosis not present

## 2017-03-04 DIAGNOSIS — Z5181 Encounter for therapeutic drug level monitoring: Secondary | ICD-10-CM

## 2017-03-04 DIAGNOSIS — I4891 Unspecified atrial fibrillation: Secondary | ICD-10-CM | POA: Diagnosis not present

## 2017-03-04 LAB — POCT INR: INR: 2

## 2017-03-05 DIAGNOSIS — H524 Presbyopia: Secondary | ICD-10-CM | POA: Diagnosis not present

## 2017-03-05 DIAGNOSIS — Z961 Presence of intraocular lens: Secondary | ICD-10-CM | POA: Diagnosis not present

## 2017-03-06 ENCOUNTER — Other Ambulatory Visit: Payer: Self-pay | Admitting: *Deleted

## 2017-03-06 ENCOUNTER — Encounter: Payer: Self-pay | Admitting: Physician Assistant

## 2017-03-06 MED ORDER — TORSEMIDE 20 MG PO TABS: 20.0000 mg | ORAL_TABLET | Freq: Every day | ORAL | 6 refills | Status: DC

## 2017-04-01 ENCOUNTER — Ambulatory Visit (INDEPENDENT_AMBULATORY_CARE_PROVIDER_SITE_OTHER): Payer: Medicare Other | Admitting: Pharmacist

## 2017-04-01 ENCOUNTER — Ambulatory Visit (INDEPENDENT_AMBULATORY_CARE_PROVIDER_SITE_OTHER): Payer: Medicare Other | Admitting: *Deleted

## 2017-04-01 DIAGNOSIS — I495 Sick sinus syndrome: Secondary | ICD-10-CM

## 2017-04-01 DIAGNOSIS — Z5181 Encounter for therapeutic drug level monitoring: Secondary | ICD-10-CM | POA: Diagnosis not present

## 2017-04-01 DIAGNOSIS — I4891 Unspecified atrial fibrillation: Secondary | ICD-10-CM | POA: Diagnosis not present

## 2017-04-01 DIAGNOSIS — Z7901 Long term (current) use of anticoagulants: Secondary | ICD-10-CM | POA: Diagnosis not present

## 2017-04-01 DIAGNOSIS — I482 Chronic atrial fibrillation: Secondary | ICD-10-CM | POA: Diagnosis not present

## 2017-04-01 DIAGNOSIS — I4821 Permanent atrial fibrillation: Secondary | ICD-10-CM

## 2017-04-01 LAB — POCT INR: INR: 2.6

## 2017-04-01 MED ORDER — TORSEMIDE 20 MG PO TABS: 20.0000 mg | ORAL_TABLET | Freq: Every day | ORAL | 1 refills | Status: DC

## 2017-04-01 NOTE — Progress Notes (Signed)
Remote pacemaker transmission.   

## 2017-04-02 LAB — CUP PACEART REMOTE DEVICE CHECK
Brady Statistic RA Percent Paced: 100 %
Date Time Interrogation Session: 20180711045500
Implantable Lead Implant Date: 20130917
Implantable Lead Location: 753859
Implantable Lead Location: 753860
Implantable Lead Model: 4469
Implantable Lead Serial Number: 448722
Implantable Pulse Generator Implant Date: 20130917
Lead Channel Impedance Value: 369 Ohm
Lead Channel Impedance Value: 388 Ohm
Lead Channel Setting Pacing Amplitude: 2.4 V
Lead Channel Setting Pacing Pulse Width: 0.4 ms
MDC IDC LEAD IMPLANT DT: 20130917
MDC IDC LEAD SERIAL: 484382
MDC IDC MSMT BATTERY REMAINING LONGEVITY: 102 mo
MDC IDC MSMT BATTERY REMAINING PERCENTAGE: 100 %
MDC IDC SET LEADCHNL RV SENSING SENSITIVITY: 2.5 mV
MDC IDC STAT BRADY RV PERCENT PACED: 93 %
Pulse Gen Serial Number: 111635

## 2017-04-07 ENCOUNTER — Encounter: Payer: Self-pay | Admitting: Cardiology

## 2017-04-21 ENCOUNTER — Encounter: Payer: Self-pay | Admitting: Cardiology

## 2017-05-13 ENCOUNTER — Ambulatory Visit (INDEPENDENT_AMBULATORY_CARE_PROVIDER_SITE_OTHER): Payer: Medicare Other

## 2017-05-13 DIAGNOSIS — Z5181 Encounter for therapeutic drug level monitoring: Secondary | ICD-10-CM | POA: Diagnosis not present

## 2017-05-13 DIAGNOSIS — I482 Chronic atrial fibrillation: Secondary | ICD-10-CM

## 2017-05-13 DIAGNOSIS — I4821 Permanent atrial fibrillation: Secondary | ICD-10-CM

## 2017-05-13 DIAGNOSIS — I4891 Unspecified atrial fibrillation: Secondary | ICD-10-CM | POA: Diagnosis not present

## 2017-05-13 DIAGNOSIS — Z7901 Long term (current) use of anticoagulants: Secondary | ICD-10-CM

## 2017-05-13 LAB — POCT INR: INR: 2.1

## 2017-05-20 DIAGNOSIS — Z79899 Other long term (current) drug therapy: Secondary | ICD-10-CM | POA: Diagnosis not present

## 2017-05-21 ENCOUNTER — Encounter: Payer: Self-pay | Admitting: Physician Assistant

## 2017-05-21 ENCOUNTER — Ambulatory Visit (INDEPENDENT_AMBULATORY_CARE_PROVIDER_SITE_OTHER): Payer: Medicare Other | Admitting: Physician Assistant

## 2017-05-21 VITALS — BP 128/50 | HR 68 | Ht 66.0 in | Wt 119.8 lb

## 2017-05-21 DIAGNOSIS — I10 Essential (primary) hypertension: Secondary | ICD-10-CM

## 2017-05-21 DIAGNOSIS — N183 Chronic kidney disease, stage 3 unspecified: Secondary | ICD-10-CM

## 2017-05-21 DIAGNOSIS — I5042 Chronic combined systolic (congestive) and diastolic (congestive) heart failure: Secondary | ICD-10-CM

## 2017-05-21 DIAGNOSIS — I4821 Permanent atrial fibrillation: Secondary | ICD-10-CM

## 2017-05-21 DIAGNOSIS — I340 Nonrheumatic mitral (valve) insufficiency: Secondary | ICD-10-CM | POA: Diagnosis not present

## 2017-05-21 DIAGNOSIS — I482 Chronic atrial fibrillation: Secondary | ICD-10-CM | POA: Diagnosis not present

## 2017-05-21 DIAGNOSIS — I351 Nonrheumatic aortic (valve) insufficiency: Secondary | ICD-10-CM

## 2017-05-21 NOTE — Progress Notes (Signed)
Cardiology Office Note    Date:  05/21/2017  ID:  Eddie Park, DOB 11/27/1926, MRN [id number] PCP:  Leighton Ruff, MD  Cardiologist: Dr. Irish Lack   Chief Complaint: f/u CAD, afib  History of Present Illness:  Eddie Park is a 83 y.o. male with history of hearing loss (VERY hard of hearing) CAD s/p MI 1995 (further details unknown), HTN, mitral valve regurgitation (previously severe, but mild in 2017), moderate aortic regurgitation as of 2017, permanent atrial fib with tachy/brady syndrome (initial implant 2007, last gen change 2013 with BSX dual chamber PPM), prostate CA, seizure disorder, chronic combined CHF, CKD stage III, hyponatremia, chronic anemia who presents for 6 month follow-up. Nuc 2010 was abnormal with mostly fixed inf wall defect c/w prior infarction, subtle peri-infarct ischemia, EF down to 40% - managed medically due to elevated Cr and subtle finding. Last echo 07/2016 showed mild LVH, EF 40-45%, moderate AI, mild MR, severe LAE, mild RV dilation, moderate TR, PASP 86mmHg, small pericardial effusion. Last labs 10/2016 K 4.3, Cr 1.62, Hgb 8.8 (baseline around 8.7-9.9), plt 130. At last OV 10/2016 with Dr. Irish Lack, he did not think the patient would be a candidate for any type of surgical repair.  He presents back for follow-up with [name], his wife. Overall he has been doing stable. He's not had any falls since last November. He does have rare fleeting dizziness but nothing sustained. This is about once a week. No CP, SOB, orthopnea, LEE, PND. His wife wants to get into [employer] but he does not like dealing with smelling sweaty people on the track at the gym. He occasionally uses a walker but often foregoes it. No bleeding. Does not have a nephrologist.  Past Medical History:  Diagnosis Date  . Anticoagulated on Coumadin   . Anxiety   . Aortic insufficiency    a. mod-severe by echo 2014. b. mod by echo 2017.  [name] Chronic anemia   . Chronic combined systolic and  diastolic CHF (congestive heart failure) ([place])   . CKD (chronic kidney disease), stage III   . Coronary artery disease    a. s/p MI 1995.  [name] Hard of hearing   . Hypertension   . Hyponatremia   . Mitral regurgitation    a. prev severe, more recently mild in 2017 -> not felt to be candidate for any kind of surgical repair.  [name] Permanent atrial fibrillation ([place])   . Prostate cancer ([place]) 2002  . Seizure disorder ([place])   . Tachycardia-bradycardia syndrome ([place])    a. w/ sycope - initial implant 2007, last gen change 2013 with BSX dual chamber PPM.    Past Surgical History:  Procedure Laterality Date  . artificial urinary sphincter    . PACEMAKER INSERTION  04/29/06   Generator change (BSc) by Dr Rayann Heman 06/21/12  . PERMANENT PACEMAKER GENERATOR CHANGE N/A 06/08/2012   Procedure: PERMANENT PACEMAKER GENERATOR CHANGE;  Surgeon: Thompson Grayer, MD;  Location: [facility] CATH LAB;  Service: Cardiovascular;  Laterality: N/A;  . PROSTATE SURGERY      Current Medications: Current Meds  Medication Sig  . cholecalciferol (VITAMIN D) 1000 UNITS tablet Take 2,000 Units by mouth daily.  . fish oil-omega-3 fatty acids 1000 MG capsule Take 2 g by mouth daily.  [name] JANTOVEN 5 MG tablet TAKE AS DIRECTED BY COUMADIN CLINIC  . lisinopril (PRINIVIL,ZESTRIL) 2.5 MG tablet TAKE 1 TABLET BY MOUTH DAILY  . Multiple Vitamin (MULTIVITAMIN WITH MINERALS) TABS Take 1 tablet by mouth daily.  [name]  PHENobarbital (LUMINAL) 97.2 MG tablet Take 97.2 mg by mouth at bedtime.  . sertraline (ZOLOFT) 100 MG tablet Take 100 mg by mouth daily.  [name] torsemide (DEMADEX) 20 MG tablet Take 1 tablet (20 mg total) by mouth daily.  . vitamin B-12 (CYANOCOBALAMIN) 1000 MCG tablet Take 1,000 mcg by mouth daily.     Allergies:   Patient has no known allergies.   Social History   Social History  . Marital status: Married    Spouse name: N/A  . Number of children: N/A  . Years of education: N/A   Social History Main Topics  . Smoking status:  Never Smoker  . Smokeless tobacco: Never Used  . Alcohol use No  . Drug use: No  . Sexual activity: Not Asked   Other Topics Concern  . None   Social History Narrative   Lives in [place]   Retired Furniture conservator/restorer     Family History:  Family History  Problem Relation Age of Onset  . Colon polyps Brother   . Heart attack Father   . Heart attack Brother     ROS:   Please see the history of present illness.  All other systems are reviewed and otherwise negative.    PHYSICAL EXAM:   VS:  BP (!) 128/50   Pulse 68   Ht 5\' 6"  (1.676 m)   Wt 119 lb 12.8 oz (54.3 kg)   SpO2 99%   BMI 19.34 kg/m   BMI: Body mass index is 19.34 kg/m. GEN: Frail elderly appearing WM, in no acute distress  HEENT: normocephalic, atraumatic Neck: no JVD, carotid bruits, or masses Cardiac: mostly regular with rare ectopy; no murmurs, rubs, or gallops, no edema  Respiratory: clear to auscultation bilaterally, normal work of breathing GI: soft, nontender, nondistended, + BS MS: no deformity or atrophy  Skin: warm and dry, no rash Neuro:  Alert and Oriented x 3, Strength and sensation are intact, follows commands Psych: euthymic mood, full affect  Wt Readings from Last 3 Encounters:  05/21/17 119 lb 12.8 oz (54.3 kg)  11/14/16 115 lb 12.8 oz (52.5 kg)  08/12/16 111 lb 12.8 oz (50.7 kg)      Studies/Labs Reviewed:   EKG:  EKG was ordered today and personally reviewed by me and demonstrates irregular rhythm question underlying atrial fib, demand V pacing, occasional PVC, LBBB   Recent Labs: 07/18/2016: TSH 0.92 07/22/2016: ALT 15 07/26/2016: B Natriuretic Peptide 1,450.6 11/14/2016: BUN 40; Creatinine, Ser 1.62; Hemoglobin 8.8; Platelets 130; Potassium 4.3; Sodium 137   Lipid Panel   Additional studies/ records that were reviewed today include: Summarized above   ASSESSMENT & PLAN:   1. Permanent atrial fibrillation - rate controlled. F/u CBC today since he is maintained on chronic  anticoagulation. He has not fallen since last fall. However, we discussed his frailty and I told him I think it may be a good idea given his history of gait instability to consider having an assistive device handy when he's up and about just in case to steady himself, such as his walker. He would like to get into an exercise program but he and his wife just haven't gotten the urge to get started yet. They declined home health PT today but I told them to let me know if it's something he wants to pursue to increase endurance and strength. 2. Chronic combined CHF - appears euvolemic on exam. He feels like he's doing much better this year than last, having found a happy  balance with his CHF. Would continue current regimen for now but instructed patient/wife to notify office if he begins having any more frequent dizziness/unsteadiness at which time I would consider stopping ACEI altogether since his diastolic pressure is a little low. 3. Essential HTN - follow as above. 4. Mitral regurgitation/aortic insufficiency - follow clinically. Asymptomatic at this time. 5. CKD III -  I feel he would benefit from longterm follow-up for nephrology as he might benefit from further management of his anemia of chronic disease and general disease maintenance implicated in CKD. Will refer.  Disposition: F/u with Dr. Irish Lack in 6 months.  Medication Adjustments/Labs and Tests Ordered: Current medicines are reviewed at length with the patient today.  Concerns regarding medicines are outlined above. Medication changes, Labs and Tests ordered today are summarized above and listed in the Patient Instructions accessible in Encounters.   Signed, Charlie Pitter, PA-C  05/21/2017 3:40 PM    [facility] [address], [place], [place]  [address] Phone: [phone]; Fax: [phone]

## 2017-05-21 NOTE — Patient Instructions (Signed)
Medication Instructions:  Your physician recommends that you continue on your current medications as directed. Please refer to the Current Medication list given to you today.   Labwork: TODAY;  BMET & CBC  Testing/Procedures: None ordered  Follow-Up: Your physician wants you to follow-up in: [place] DR.  VARANASI   You will receive a reminder letter in the mail two months in advance. If you don't receive a letter, please call our office to schedule the follow-up appointment.  You have been referred to NEPHROLOGY.  They will call you with an appointment.   Any Other Special Instructions Will Be Listed Below (If Applicable).     If you need a refill on your cardiac medications before your next appointment, please call your pharmacy.

## 2017-05-22 LAB — BASIC METABOLIC PANEL
BUN/Creatinine Ratio: 25 — ABNORMAL HIGH (ref 10–24)
BUN: 35 mg/dL — AB (ref 8–27)
CALCIUM: 9 mg/dL (ref 8.6–10.2)
CHLORIDE: 94 mmol/L — AB (ref 96–106)
CO2: 26 mmol/L (ref 20–29)
Creatinine, Ser: 1.41 mg/dL — ABNORMAL HIGH (ref 0.76–1.27)
GFR calc Af Amer: 51 mL/min/{1.73_m2} — ABNORMAL LOW (ref >59)
GFR calc non Af Amer: 44 mL/min/{1.73_m2} — ABNORMAL LOW (ref >59)
GLUCOSE: 76 mg/dL (ref 65–99)
POTASSIUM: 4 mmol/L (ref 3.5–5.2)
SODIUM: 136 mmol/L (ref 134–144)

## 2017-05-22 LAB — CBC
Hematocrit: 28.7 % — ABNORMAL LOW (ref 37.5–51.0)
Hemoglobin: 9.5 g/dL — ABNORMAL LOW (ref 13.0–17.7)
MCH: 30.3 pg (ref 26.6–33.0)
MCHC: 33.1 g/dL (ref 31.5–35.7)
MCV: 91 fL (ref 79–97)
Platelets: 131 10*3/uL — ABNORMAL LOW (ref 150–379)
RBC: 3.14 x10E6/uL — AB (ref 4.14–5.80)
RDW: 13.2 % (ref 12.3–15.4)
WBC: 5 10*3/uL (ref 3.4–10.8)

## 2017-05-22 NOTE — Addendum Note (Signed)
Addended by: Briant Cedar on: 05/22/2017 12:19 PM   Modules accepted: Orders

## 2017-05-26 ENCOUNTER — Other Ambulatory Visit: Payer: Self-pay | Admitting: Interventional Cardiology

## 2017-05-28 DIAGNOSIS — D1801 Hemangioma of skin and subcutaneous tissue: Secondary | ICD-10-CM | POA: Diagnosis not present

## 2017-05-28 DIAGNOSIS — L821 Other seborrheic keratosis: Secondary | ICD-10-CM | POA: Diagnosis not present

## 2017-05-28 DIAGNOSIS — Z85828 Personal history of other malignant neoplasm of skin: Secondary | ICD-10-CM | POA: Diagnosis not present

## 2017-05-28 DIAGNOSIS — D170 Benign lipomatous neoplasm of skin and subcutaneous tissue of head, face and neck: Secondary | ICD-10-CM | POA: Diagnosis not present

## 2017-05-28 DIAGNOSIS — D225 Melanocytic nevi of trunk: Secondary | ICD-10-CM | POA: Diagnosis not present

## 2017-05-28 DIAGNOSIS — L814 Other melanin hyperpigmentation: Secondary | ICD-10-CM | POA: Diagnosis not present

## 2017-06-09 ENCOUNTER — Telehealth: Payer: Self-pay | Admitting: Interventional Cardiology

## 2017-06-09 NOTE — Telephone Encounter (Signed)
New message   Jonelle Sidle is a home health SALES REPRESENTIVE from [facility] is calling to speak to rn  About an order

## 2017-06-09 NOTE — Telephone Encounter (Signed)
Tiffany from [facility] called and asking if we received any paperwork that she faxed last week. I let her know we did not. She states that she is going to re-fax the information.

## 2017-06-22 ENCOUNTER — Ambulatory Visit (INDEPENDENT_AMBULATORY_CARE_PROVIDER_SITE_OTHER): Payer: Medicare Other | Admitting: Internal Medicine

## 2017-06-22 ENCOUNTER — Ambulatory Visit (INDEPENDENT_AMBULATORY_CARE_PROVIDER_SITE_OTHER): Payer: Medicare Other | Admitting: *Deleted

## 2017-06-22 ENCOUNTER — Encounter: Payer: Self-pay | Admitting: Internal Medicine

## 2017-06-22 VITALS — BP 122/58 | HR 62 | Ht 66.0 in | Wt 119.4 lb

## 2017-06-22 DIAGNOSIS — I482 Chronic atrial fibrillation: Secondary | ICD-10-CM

## 2017-06-22 DIAGNOSIS — I495 Sick sinus syndrome: Secondary | ICD-10-CM

## 2017-06-22 DIAGNOSIS — I4891 Unspecified atrial fibrillation: Secondary | ICD-10-CM | POA: Diagnosis not present

## 2017-06-22 DIAGNOSIS — I10 Essential (primary) hypertension: Secondary | ICD-10-CM

## 2017-06-22 DIAGNOSIS — I442 Atrioventricular block, complete: Secondary | ICD-10-CM

## 2017-06-22 DIAGNOSIS — I4821 Permanent atrial fibrillation: Secondary | ICD-10-CM

## 2017-06-22 DIAGNOSIS — Z7901 Long term (current) use of anticoagulants: Secondary | ICD-10-CM

## 2017-06-22 DIAGNOSIS — I5042 Chronic combined systolic (congestive) and diastolic (congestive) heart failure: Secondary | ICD-10-CM

## 2017-06-22 DIAGNOSIS — Z5181 Encounter for therapeutic drug level monitoring: Secondary | ICD-10-CM

## 2017-06-22 LAB — CUP PACEART INCLINIC DEVICE CHECK
Date Time Interrogation Session: 20181001040000
Implantable Lead Implant Date: 20130917
Implantable Lead Model: 4456
Implantable Lead Model: 4469
Implantable Lead Serial Number: 448722
Lead Channel Impedance Value: 374 Ohm
Lead Channel Setting Pacing Amplitude: 2.4 V
Lead Channel Setting Pacing Pulse Width: 0.4 ms
Lead Channel Setting Sensing Sensitivity: 2.5 mV
MDC IDC LEAD IMPLANT DT: 20130917
MDC IDC LEAD LOCATION: 753859
MDC IDC LEAD LOCATION: 753860
MDC IDC LEAD SERIAL: 484382
MDC IDC MSMT LEADCHNL RA IMPEDANCE VALUE: 353 Ohm
MDC IDC MSMT LEADCHNL RV PACING THRESHOLD AMPLITUDE: 0.7 V
MDC IDC MSMT LEADCHNL RV PACING THRESHOLD PULSEWIDTH: 0.4 ms
MDC IDC PG IMPLANT DT: 20130917
Pulse Gen Serial Number: 111635

## 2017-06-22 LAB — POCT INR: INR: 2.5

## 2017-06-22 NOTE — Progress Notes (Signed)
PCP: Leighton Ruff, MD Primary Cardiologist:  Dr Irish Lack Primary EP: Dr Rayann Heman  Eddie Park is a 83 y.o. male who presents today for routine electrophysiology followup.  Since last being seen in our clinic, the patient reports doing very well.  Today, he denies symptoms of palpitations, chest pain, shortness of breath,  lower extremity edema, dizziness, presyncope, syncope or bleeding.  The patient is otherwise without complaint today.   Past Medical History:  Diagnosis Date  . Anticoagulated on Coumadin   . Anxiety   . Aortic insufficiency    a. mod-severe by echo 2014. b. mod by echo 2017.  Eddie Park Chronic anemia   . Chronic combined systolic and diastolic CHF (congestive heart failure) ([place])   . CKD (chronic kidney disease), stage III ([place])   . Coronary artery disease    a. s/p MI 1995.  Eddie Park Hard of hearing   . Hypertension   . Hyponatremia   . Mitral regurgitation    a. prev severe, more recently mild in 2017 -> not felt to be candidate for any kind of surgical repair.  Eddie Park Permanent atrial fibrillation ([place])   . Prostate cancer ([place]) 2002  . Seizure disorder ([place])   . Tachycardia-bradycardia syndrome ([place])    a. w/ sycope - initial implant 2007, last gen change 2013 with BSX dual chamber PPM.   Past Surgical History:  Procedure Laterality Date  . artificial urinary sphincter    . PACEMAKER INSERTION  04/29/06   Generator change (BSc) by Dr Rayann Heman 06/21/12  . PERMANENT PACEMAKER GENERATOR CHANGE N/A 06/08/2012   Procedure: PERMANENT PACEMAKER GENERATOR CHANGE;  Surgeon: Thompson Grayer, MD;  Location: [facility] CATH LAB;  Service: Cardiovascular;  Laterality: N/A;  . PROSTATE SURGERY      ROS- all systems are reviewed and negative except as per HPI above  Current Outpatient Prescriptions  Medication Sig Dispense Refill  . cholecalciferol (VITAMIN D) 1000 UNITS tablet Take 2,000 Units by mouth daily.    . fish oil-omega-3 fatty acids 1000 MG capsule Take 2 g by mouth daily.    Eddie Park  JANTOVEN 5 MG tablet take as directed by coumadin clinic 180 tablet 1  . lisinopril (PRINIVIL,ZESTRIL) 2.5 MG tablet TAKE 1 TABLET BY MOUTH DAILY 90 tablet 2  . Multiple Vitamin (MULTIVITAMIN WITH MINERALS) TABS Take 1 tablet by mouth daily.    Eddie Park PHENobarbital (LUMINAL) 97.2 MG tablet Take 97.2 mg by mouth at bedtime.    . sertraline (ZOLOFT) 100 MG tablet Take 100 mg by mouth daily.    Eddie Park torsemide (DEMADEX) 20 MG tablet Take 1 tablet (20 mg total) by mouth daily. 90 tablet 1  . vitamin B-12 (CYANOCOBALAMIN) 1000 MCG tablet Take 1,000 mcg by mouth daily.     No current facility-administered medications for this visit.     Physical Exam: Vitals:   06/22/17 1019  BP: (!) 122/58  Pulse: 62  SpO2: 96%  Weight: 119 lb 6.4 oz (54.2 kg)  Height: 5\' 6"  (1.676 m)    GEN- The patient is elderly appearing, alert and oriented x 3 today.   Head- normocephalic, atraumatic Eyes-  Sclera clear, conjunctiva pink Ears- hearing intact Oropharynx- clear Lungs- Clear to ausculation bilaterally, normal work of breathing Chest- PPM pocket is well healed Heart- Regular rate and rhythm, no murmurs, rubs or gallops, PMI not laterally displaced GI- soft, NT, ND, + BS Extremities- no clubbing, cyanosis, or edema  PPM interrogation- reviewed in detail today,  See PACEART report  Assessment and Plan:  1.  Complete heart block Stable on an appropriate medical regimen Normal PPM function See Pace Art report No changes today  2. Permanent afib On coumadin No changes  3. HTN Stable No change required today  4. Chronic combined systolic and diastolic dysfunction Stable No change required today  [place] every 3 months Return to see EP NP every year Follow-up with Dr Irish Lack as scheduled.  Thompson Grayer MD, [facility] 06/22/2017 10:41 AM

## 2017-06-22 NOTE — Patient Instructions (Signed)
Medication Instructions:  Your physician recommends that you continue on your current medications as directed. Please refer to the Current Medication list given to you today.   Labwork: None ordered   Testing/Procedures: None ordered   Follow-Up: Remote monitoring is used to monitor your Pacemaker  from home. This monitoring reduces the number of office visits required to check your device to one time per year. It allows [place] to keep an eye on the functioning of your device to ensure it is working properly. You are scheduled for a device check from home on 07/01/17. You may send your transmission at any time that day. If you have a wireless device, the transmission will be sent automatically. After your physician reviews your transmission, you will receive a postcard with your next transmission date.  Your physician wants you to follow-up in: 12 months with Chanetta Marshall, NP You will receive a reminder letter in the mail two months in advance. If you don't receive a letter, please call our office to schedule the follow-up appointment.     Any Other Special Instructions Will Be Listed Below (If Applicable).     If you need a refill on your cardiac medications before your next appointment, please call your pharmacy.

## 2017-06-23 ENCOUNTER — Telehealth: Payer: Self-pay | Admitting: Interventional Cardiology

## 2017-06-23 NOTE — Telephone Encounter (Signed)
Follow Up:    Eddie Park checking on the status of the [facility] referral form.

## 2017-06-23 NOTE — Telephone Encounter (Signed)
Tiffany states that she will fax over the paperwork tomorrow.

## 2017-06-29 NOTE — Telephone Encounter (Signed)
Called Eddie Park and made her aware that the paperwork has been received and has been signed by Dr. Irish Lack and was given to medical records to fax back.

## 2017-07-01 ENCOUNTER — Ambulatory Visit (INDEPENDENT_AMBULATORY_CARE_PROVIDER_SITE_OTHER): Payer: Medicare Other | Admitting: *Deleted

## 2017-07-01 DIAGNOSIS — I495 Sick sinus syndrome: Secondary | ICD-10-CM

## 2017-07-01 NOTE — Progress Notes (Signed)
Remote pacemaker transmission.   

## 2017-07-02 ENCOUNTER — Telehealth: Payer: Self-pay | Admitting: Internal Medicine

## 2017-07-02 NOTE — Telephone Encounter (Signed)
New Message  Marlowe Kays from [facility] rehab called requesting to speak with RN about getting an order to go to pts home for Monday 10/15. She states pts wife is declining. Please call back to discuss

## 2017-07-02 NOTE — Telephone Encounter (Signed)
Left message for Connie to call back

## 2017-07-03 ENCOUNTER — Encounter: Payer: Self-pay | Admitting: Cardiology

## 2017-07-07 ENCOUNTER — Telehealth: Payer: Self-pay | Admitting: Interventional Cardiology

## 2017-07-07 DIAGNOSIS — R2689 Other abnormalities of gait and mobility: Secondary | ICD-10-CM | POA: Diagnosis not present

## 2017-07-07 DIAGNOSIS — N183 Chronic kidney disease, stage 3 (moderate): Secondary | ICD-10-CM | POA: Diagnosis not present

## 2017-07-07 DIAGNOSIS — I5022 Chronic systolic (congestive) heart failure: Secondary | ICD-10-CM | POA: Diagnosis not present

## 2017-07-07 DIAGNOSIS — I4891 Unspecified atrial fibrillation: Secondary | ICD-10-CM | POA: Diagnosis not present

## 2017-07-07 DIAGNOSIS — I2510 Atherosclerotic heart disease of native coronary artery without angina pectoris: Secondary | ICD-10-CM | POA: Diagnosis not present

## 2017-07-07 DIAGNOSIS — I130 Hypertensive heart and chronic kidney disease with heart failure and stage 1 through stage 4 chronic kidney disease, or unspecified chronic kidney disease: Secondary | ICD-10-CM | POA: Diagnosis not present

## 2017-07-07 NOTE — Telephone Encounter (Signed)
Spoke with Eddie Park and made her aware that Dr. Irish Lack has already signed orders and we faxed them back to them on 06/29/17. Eddie Park verbalized understanding.

## 2017-07-07 NOTE — Telephone Encounter (Signed)
New message    Nurse from [facility] is calling asking for a call back. She said she needs orders for start of care and to go over some drug interactions from his medications. Please call.

## 2017-07-08 DIAGNOSIS — Z23 Encounter for immunization: Secondary | ICD-10-CM | POA: Diagnosis not present

## 2017-07-10 DIAGNOSIS — R2689 Other abnormalities of gait and mobility: Secondary | ICD-10-CM | POA: Diagnosis not present

## 2017-07-10 DIAGNOSIS — I130 Hypertensive heart and chronic kidney disease with heart failure and stage 1 through stage 4 chronic kidney disease, or unspecified chronic kidney disease: Secondary | ICD-10-CM | POA: Diagnosis not present

## 2017-07-10 DIAGNOSIS — G40909 Epilepsy, unspecified, not intractable, without status epilepticus: Secondary | ICD-10-CM | POA: Diagnosis not present

## 2017-07-10 DIAGNOSIS — I4891 Unspecified atrial fibrillation: Secondary | ICD-10-CM | POA: Diagnosis not present

## 2017-07-10 DIAGNOSIS — I5042 Chronic combined systolic (congestive) and diastolic (congestive) heart failure: Secondary | ICD-10-CM | POA: Diagnosis not present

## 2017-07-10 DIAGNOSIS — N183 Chronic kidney disease, stage 3 (moderate): Secondary | ICD-10-CM | POA: Diagnosis not present

## 2017-07-10 DIAGNOSIS — I5022 Chronic systolic (congestive) heart failure: Secondary | ICD-10-CM | POA: Diagnosis not present

## 2017-07-10 DIAGNOSIS — I129 Hypertensive chronic kidney disease with stage 1 through stage 4 chronic kidney disease, or unspecified chronic kidney disease: Secondary | ICD-10-CM | POA: Diagnosis not present

## 2017-07-10 DIAGNOSIS — I2510 Atherosclerotic heart disease of native coronary artery without angina pectoris: Secondary | ICD-10-CM | POA: Diagnosis not present

## 2017-07-13 ENCOUNTER — Telehealth: Payer: Self-pay | Admitting: Interventional Cardiology

## 2017-07-13 DIAGNOSIS — N183 Chronic kidney disease, stage 3 (moderate): Secondary | ICD-10-CM | POA: Diagnosis not present

## 2017-07-13 DIAGNOSIS — I2510 Atherosclerotic heart disease of native coronary artery without angina pectoris: Secondary | ICD-10-CM | POA: Diagnosis not present

## 2017-07-13 DIAGNOSIS — I5022 Chronic systolic (congestive) heart failure: Secondary | ICD-10-CM | POA: Diagnosis not present

## 2017-07-13 DIAGNOSIS — R2689 Other abnormalities of gait and mobility: Secondary | ICD-10-CM | POA: Diagnosis not present

## 2017-07-13 DIAGNOSIS — I130 Hypertensive heart and chronic kidney disease with heart failure and stage 1 through stage 4 chronic kidney disease, or unspecified chronic kidney disease: Secondary | ICD-10-CM | POA: Diagnosis not present

## 2017-07-13 DIAGNOSIS — I4891 Unspecified atrial fibrillation: Secondary | ICD-10-CM | POA: Diagnosis not present

## 2017-07-13 NOTE — Telephone Encounter (Signed)
Left a detailed message on Remo Lipps, PT with [facility] that Dr. Irish Lack has already signed [facility] orders for the patient to receive PT, OT, and skilled nursing. Instructed for her to call back with any questions.

## 2017-07-13 NOTE — Telephone Encounter (Signed)
New message    Remo Lipps from [facility] is calling for start of care orders for pt. Please call.

## 2017-07-15 DIAGNOSIS — I4891 Unspecified atrial fibrillation: Secondary | ICD-10-CM | POA: Diagnosis not present

## 2017-07-15 DIAGNOSIS — I5022 Chronic systolic (congestive) heart failure: Secondary | ICD-10-CM | POA: Diagnosis not present

## 2017-07-15 DIAGNOSIS — I2510 Atherosclerotic heart disease of native coronary artery without angina pectoris: Secondary | ICD-10-CM | POA: Diagnosis not present

## 2017-07-15 DIAGNOSIS — N183 Chronic kidney disease, stage 3 (moderate): Secondary | ICD-10-CM | POA: Diagnosis not present

## 2017-07-15 DIAGNOSIS — I130 Hypertensive heart and chronic kidney disease with heart failure and stage 1 through stage 4 chronic kidney disease, or unspecified chronic kidney disease: Secondary | ICD-10-CM | POA: Diagnosis not present

## 2017-07-15 DIAGNOSIS — R2689 Other abnormalities of gait and mobility: Secondary | ICD-10-CM | POA: Diagnosis not present

## 2017-07-15 LAB — CUP PACEART REMOTE DEVICE CHECK
Battery Remaining Longevity: 90 mo
Implantable Lead Implant Date: 20130917
Implantable Lead Location: 753860
Implantable Lead Model: 4456
Implantable Lead Model: 4469
Implantable Lead Serial Number: 448722
Implantable Lead Serial Number: 484382
Implantable Pulse Generator Implant Date: 20130917
Lead Channel Impedance Value: 385 Ohm
Lead Channel Setting Pacing Amplitude: 2.4 V
Lead Channel Setting Pacing Pulse Width: 0.4 ms
MDC IDC LEAD IMPLANT DT: 20130917
MDC IDC LEAD LOCATION: 753859
MDC IDC MSMT BATTERY REMAINING PERCENTAGE: 94 %
MDC IDC MSMT LEADCHNL RV IMPEDANCE VALUE: 375 Ohm
MDC IDC SESS DTM: 20181010044100
MDC IDC SET LEADCHNL RV SENSING SENSITIVITY: 2.5 mV
MDC IDC STAT BRADY RA PERCENT PACED: 0 %
MDC IDC STAT BRADY RV PERCENT PACED: 92 %
Pulse Gen Serial Number: 111635

## 2017-07-16 DIAGNOSIS — I4891 Unspecified atrial fibrillation: Secondary | ICD-10-CM | POA: Diagnosis not present

## 2017-07-16 DIAGNOSIS — R2689 Other abnormalities of gait and mobility: Secondary | ICD-10-CM | POA: Diagnosis not present

## 2017-07-16 DIAGNOSIS — I5022 Chronic systolic (congestive) heart failure: Secondary | ICD-10-CM | POA: Diagnosis not present

## 2017-07-16 DIAGNOSIS — I130 Hypertensive heart and chronic kidney disease with heart failure and stage 1 through stage 4 chronic kidney disease, or unspecified chronic kidney disease: Secondary | ICD-10-CM | POA: Diagnosis not present

## 2017-07-16 DIAGNOSIS — N183 Chronic kidney disease, stage 3 (moderate): Secondary | ICD-10-CM | POA: Diagnosis not present

## 2017-07-16 DIAGNOSIS — I2510 Atherosclerotic heart disease of native coronary artery without angina pectoris: Secondary | ICD-10-CM | POA: Diagnosis not present

## 2017-07-17 ENCOUNTER — Encounter: Payer: Self-pay | Admitting: Cardiology

## 2017-07-17 DIAGNOSIS — I130 Hypertensive heart and chronic kidney disease with heart failure and stage 1 through stage 4 chronic kidney disease, or unspecified chronic kidney disease: Secondary | ICD-10-CM | POA: Diagnosis not present

## 2017-07-17 DIAGNOSIS — R2689 Other abnormalities of gait and mobility: Secondary | ICD-10-CM | POA: Diagnosis not present

## 2017-07-17 DIAGNOSIS — I5022 Chronic systolic (congestive) heart failure: Secondary | ICD-10-CM | POA: Diagnosis not present

## 2017-07-17 DIAGNOSIS — I4891 Unspecified atrial fibrillation: Secondary | ICD-10-CM | POA: Diagnosis not present

## 2017-07-17 DIAGNOSIS — I2510 Atherosclerotic heart disease of native coronary artery without angina pectoris: Secondary | ICD-10-CM | POA: Diagnosis not present

## 2017-07-17 DIAGNOSIS — N183 Chronic kidney disease, stage 3 (moderate): Secondary | ICD-10-CM | POA: Diagnosis not present

## 2017-07-20 DIAGNOSIS — R2689 Other abnormalities of gait and mobility: Secondary | ICD-10-CM | POA: Diagnosis not present

## 2017-07-20 DIAGNOSIS — I130 Hypertensive heart and chronic kidney disease with heart failure and stage 1 through stage 4 chronic kidney disease, or unspecified chronic kidney disease: Secondary | ICD-10-CM | POA: Diagnosis not present

## 2017-07-20 DIAGNOSIS — I4891 Unspecified atrial fibrillation: Secondary | ICD-10-CM | POA: Diagnosis not present

## 2017-07-20 DIAGNOSIS — N183 Chronic kidney disease, stage 3 (moderate): Secondary | ICD-10-CM | POA: Diagnosis not present

## 2017-07-20 DIAGNOSIS — I2510 Atherosclerotic heart disease of native coronary artery without angina pectoris: Secondary | ICD-10-CM | POA: Diagnosis not present

## 2017-07-20 DIAGNOSIS — I5022 Chronic systolic (congestive) heart failure: Secondary | ICD-10-CM | POA: Diagnosis not present

## 2017-07-21 DIAGNOSIS — N183 Chronic kidney disease, stage 3 (moderate): Secondary | ICD-10-CM | POA: Diagnosis not present

## 2017-07-21 DIAGNOSIS — I130 Hypertensive heart and chronic kidney disease with heart failure and stage 1 through stage 4 chronic kidney disease, or unspecified chronic kidney disease: Secondary | ICD-10-CM | POA: Diagnosis not present

## 2017-07-21 DIAGNOSIS — I5022 Chronic systolic (congestive) heart failure: Secondary | ICD-10-CM | POA: Diagnosis not present

## 2017-07-21 DIAGNOSIS — R2689 Other abnormalities of gait and mobility: Secondary | ICD-10-CM | POA: Diagnosis not present

## 2017-07-21 DIAGNOSIS — I4891 Unspecified atrial fibrillation: Secondary | ICD-10-CM | POA: Diagnosis not present

## 2017-07-21 DIAGNOSIS — I2510 Atherosclerotic heart disease of native coronary artery without angina pectoris: Secondary | ICD-10-CM | POA: Diagnosis not present

## 2017-07-23 DIAGNOSIS — D649 Anemia, unspecified: Secondary | ICD-10-CM | POA: Diagnosis not present

## 2017-07-23 DIAGNOSIS — E7800 Pure hypercholesterolemia, unspecified: Secondary | ICD-10-CM | POA: Diagnosis not present

## 2017-07-23 DIAGNOSIS — Z950 Presence of cardiac pacemaker: Secondary | ICD-10-CM | POA: Diagnosis not present

## 2017-07-23 DIAGNOSIS — D631 Anemia in chronic kidney disease: Secondary | ICD-10-CM | POA: Diagnosis not present

## 2017-07-23 DIAGNOSIS — N183 Chronic kidney disease, stage 3 (moderate): Secondary | ICD-10-CM | POA: Diagnosis not present

## 2017-07-23 DIAGNOSIS — G40909 Epilepsy, unspecified, not intractable, without status epilepticus: Secondary | ICD-10-CM | POA: Diagnosis not present

## 2017-07-23 DIAGNOSIS — I10 Essential (primary) hypertension: Secondary | ICD-10-CM | POA: Diagnosis not present

## 2017-07-23 DIAGNOSIS — I4891 Unspecified atrial fibrillation: Secondary | ICD-10-CM | POA: Diagnosis not present

## 2017-07-23 DIAGNOSIS — I2510 Atherosclerotic heart disease of native coronary artery without angina pectoris: Secondary | ICD-10-CM | POA: Diagnosis not present

## 2017-07-23 DIAGNOSIS — I5022 Chronic systolic (congestive) heart failure: Secondary | ICD-10-CM | POA: Diagnosis not present

## 2017-07-23 DIAGNOSIS — I351 Nonrheumatic aortic (valve) insufficiency: Secondary | ICD-10-CM | POA: Diagnosis not present

## 2017-07-23 DIAGNOSIS — E0590 Thyrotoxicosis, unspecified without thyrotoxic crisis or storm: Secondary | ICD-10-CM | POA: Diagnosis not present

## 2017-07-23 DIAGNOSIS — Z23 Encounter for immunization: Secondary | ICD-10-CM | POA: Diagnosis not present

## 2017-07-28 DIAGNOSIS — I130 Hypertensive heart and chronic kidney disease with heart failure and stage 1 through stage 4 chronic kidney disease, or unspecified chronic kidney disease: Secondary | ICD-10-CM | POA: Diagnosis not present

## 2017-07-28 DIAGNOSIS — I2510 Atherosclerotic heart disease of native coronary artery without angina pectoris: Secondary | ICD-10-CM | POA: Diagnosis not present

## 2017-07-28 DIAGNOSIS — I5022 Chronic systolic (congestive) heart failure: Secondary | ICD-10-CM | POA: Diagnosis not present

## 2017-07-28 DIAGNOSIS — I4891 Unspecified atrial fibrillation: Secondary | ICD-10-CM | POA: Diagnosis not present

## 2017-07-28 DIAGNOSIS — R2689 Other abnormalities of gait and mobility: Secondary | ICD-10-CM | POA: Diagnosis not present

## 2017-07-28 DIAGNOSIS — N183 Chronic kidney disease, stage 3 (moderate): Secondary | ICD-10-CM | POA: Diagnosis not present

## 2017-07-29 DIAGNOSIS — N183 Chronic kidney disease, stage 3 (moderate): Secondary | ICD-10-CM | POA: Diagnosis not present

## 2017-07-29 DIAGNOSIS — I130 Hypertensive heart and chronic kidney disease with heart failure and stage 1 through stage 4 chronic kidney disease, or unspecified chronic kidney disease: Secondary | ICD-10-CM | POA: Diagnosis not present

## 2017-07-29 DIAGNOSIS — I2510 Atherosclerotic heart disease of native coronary artery without angina pectoris: Secondary | ICD-10-CM | POA: Diagnosis not present

## 2017-07-29 DIAGNOSIS — I5022 Chronic systolic (congestive) heart failure: Secondary | ICD-10-CM | POA: Diagnosis not present

## 2017-07-29 DIAGNOSIS — I4891 Unspecified atrial fibrillation: Secondary | ICD-10-CM | POA: Diagnosis not present

## 2017-07-29 DIAGNOSIS — R2689 Other abnormalities of gait and mobility: Secondary | ICD-10-CM | POA: Diagnosis not present

## 2017-08-05 ENCOUNTER — Ambulatory Visit (INDEPENDENT_AMBULATORY_CARE_PROVIDER_SITE_OTHER): Payer: Medicare Other | Admitting: *Deleted

## 2017-08-05 DIAGNOSIS — Z7901 Long term (current) use of anticoagulants: Secondary | ICD-10-CM

## 2017-08-05 DIAGNOSIS — I482 Chronic atrial fibrillation: Secondary | ICD-10-CM | POA: Diagnosis not present

## 2017-08-05 DIAGNOSIS — Z5181 Encounter for therapeutic drug level monitoring: Secondary | ICD-10-CM | POA: Diagnosis not present

## 2017-08-05 DIAGNOSIS — I4821 Permanent atrial fibrillation: Secondary | ICD-10-CM

## 2017-08-05 DIAGNOSIS — I4891 Unspecified atrial fibrillation: Secondary | ICD-10-CM

## 2017-08-05 LAB — POCT INR: INR: 1.9

## 2017-08-05 NOTE — Patient Instructions (Signed)
Today Nov 14 take 2.5 tablets (12.5mg ) then continue taking same dose of Coumadin 10 mg daily except 7.5mg  on Sundays, Tuesdays, and Thursdays.  Recheck INR in 6 weeks.  Call us with any concerns or medication changes # [phone] [facility], Main # [phone]

## 2017-08-21 DIAGNOSIS — N183 Chronic kidney disease, stage 3 (moderate): Secondary | ICD-10-CM | POA: Diagnosis not present

## 2017-08-21 DIAGNOSIS — D631 Anemia in chronic kidney disease: Secondary | ICD-10-CM | POA: Diagnosis not present

## 2017-09-09 ENCOUNTER — Other Ambulatory Visit: Payer: Self-pay | Admitting: *Deleted

## 2017-09-09 ENCOUNTER — Encounter: Payer: Self-pay | Admitting: Neurology

## 2017-09-09 ENCOUNTER — Ambulatory Visit (INDEPENDENT_AMBULATORY_CARE_PROVIDER_SITE_OTHER): Payer: Medicare Other | Admitting: Neurology

## 2017-09-09 VITALS — BP 128/58 | HR 59 | Ht 66.0 in | Wt 115.5 lb

## 2017-09-09 DIAGNOSIS — M4807 Spinal stenosis, lumbosacral region: Secondary | ICD-10-CM | POA: Diagnosis not present

## 2017-09-09 DIAGNOSIS — F0390 Unspecified dementia without behavioral disturbance: Secondary | ICD-10-CM | POA: Diagnosis not present

## 2017-09-09 DIAGNOSIS — R569 Unspecified convulsions: Secondary | ICD-10-CM

## 2017-09-09 DIAGNOSIS — R269 Unspecified abnormalities of gait and mobility: Secondary | ICD-10-CM

## 2017-09-09 MED ORDER — LEVETIRACETAM 500 MG PO TABS
500.0000 mg | ORAL_TABLET | Freq: Two times a day (BID) | ORAL | 11 refills | Status: DC
Start: 2017-09-09 — End: 2017-10-01

## 2017-09-09 MED ORDER — LEVETIRACETAM 500 MG PO TABS: 500.0000 mg | ORAL_TABLET | Freq: Two times a day (BID) | ORAL | 11 refills | Status: DC

## 2017-09-09 NOTE — Progress Notes (Signed)
PATIENT: Eddie Park DOB: 03/11/1927  Chief Complaint  Patient presents with  . Seizures    He is here with his wife, [name]. He was diagnosed in 1999 and was started on phenobarbital.  He has not had a seizure in about 20 years.  There is a concern that this medication is affecting his kidneys.  [name] PCP    Leighton Ruff, MD  . Nephrology    Madelon Lips, MD - referring MD     HISTORICAL  DAYLN TUGWELL is a 83 year old male, seen in refer by his nephrologist Dr. Madelon Lips and his primary care physician Dr. Leighton Ruff for evaluation of seizure, initial evaluation was on September 09, 2017.  I reviewed and summarized the referring note, he has history of chronic renal disease, atrial fibrillation, coronary artery disease, hypertension, prostate cancer, anemia related to chronic renal failure, seizure disorder,  He reported history of seizures since 1999, was treated with phenobarbital 97.2 mg since then as well, he has 2 different kind of seizure, generalized tonic-clonic seizure without warning signs, or staring spells, last seizure was in 1999,  Chronic kidney disease has been under the care of his primary care for many years, was recently referred to nephrologist Dr. Madelon Lips, who is wondering if his long-term phenobarbital use contributed to his kidney disease,  I looked up Micromedex, there was no evidence to suggest that low-dose long-term phenobarbital use is related to kidney disease, but he has history of atrial fibrillation, taking Coumadin, phenobarbital is a enzyme inducer, is not a good antiepileptic medication in this scenario.  He is a retired Furniture conservator/restorer, since 2015, he was noted to have gradual onset memory loss, gait abnormality, I fell in 2017, also had urinary urgency, occasional incontinence, which he attributed to his prostate problem.  Laboratory evaluations, INR 2.5, CBC showed hemoglobin of 9.5, creatinine 1.4 1,  I personally  reviewed a CT head in October 2017, chronic right cerebellar infarction, small vessel disease, mild cervical degenerative changes,   REVIEW OF SYSTEMS: Full 14 system review of systems performed and notable only for memory loss, gait abnormality, confusion, snoring, seizure, anxiety, anemia, incontinence, impotence, hearing loss  ALLERGIES: No Known Allergies  HOME MEDICATIONS: Current Outpatient Medications  Medication Sig Dispense Refill  . cholecalciferol (VITAMIN D) 1000 UNITS tablet Take 2,000 Units by mouth daily.    . fish oil-omega-3 fatty acids 1000 MG capsule Take 2 g by mouth daily.    [name] JANTOVEN 5 MG tablet take as directed by coumadin clinic 180 tablet 1  . lisinopril (PRINIVIL,ZESTRIL) 2.5 MG tablet TAKE 1 TABLET BY MOUTH DAILY 90 tablet 2  . Multiple Vitamin (MULTIVITAMIN WITH MINERALS) TABS Take 1 tablet by mouth daily.    [name] PHENobarbital (LUMINAL) 97.2 MG tablet Take 97.2 mg by mouth at bedtime.    . sertraline (ZOLOFT) 100 MG tablet Take 100 mg by mouth daily.    . vitamin B-12 (CYANOCOBALAMIN) 1000 MCG tablet Take 1,000 mcg by mouth daily.    [name] torsemide (DEMADEX) 20 MG tablet Take 1 tablet (20 mg total) by mouth daily. 90 tablet 1   No current facility-administered medications for this visit.     PAST MEDICAL HISTORY: Past Medical History:  Diagnosis Date  . Anticoagulated on Coumadin   . Anxiety   . Aortic insufficiency    a. mod-severe by echo 2014. b. mod by echo 2017.  [name] Chronic anemia   . Chronic combined systolic and diastolic CHF (congestive heart  failure) ([place])   . CKD (chronic kidney disease), stage III ([place])   . Coronary artery disease    a. s/p MI 1995.  [name] Hard of hearing   . Hypertension   . Hyponatremia   . Mitral regurgitation    a. prev severe, more recently mild in 2017 -> not felt to be candidate for any kind of surgical repair.  [name] Permanent atrial fibrillation ([place])   . Prostate cancer ([place]) 2002  . Seizure disorder ([place])   .  Tachycardia-bradycardia syndrome ([place])    a. w/ sycope - initial implant 2007, last gen change 2013 with BSX dual chamber PPM.    PAST SURGICAL HISTORY: Past Surgical History:  Procedure Laterality Date  . artificial urinary sphincter    . HERNIA REPAIR  2002  . PACEMAKER INSERTION  04/29/06   Generator change (BSc) by Dr Rayann Heman 06/21/12  . PERMANENT PACEMAKER GENERATOR CHANGE N/A 06/08/2012   Procedure: PERMANENT PACEMAKER GENERATOR CHANGE;  Surgeon: Thompson Grayer, MD;  Location: [facility] CATH LAB;  Service: Cardiovascular;  Laterality: N/A;  . PROSTATE SURGERY    . PROSTATECTOMY  2002    FAMILY HISTORY: Family History  Problem Relation Age of Onset  . Colon polyps Brother   . Heart disease Mother   . Heart attack Mother   . Heart attack Father   . Heart disease Father   . Diabetes Father        Also, three of his brothers had diabetes.  [name] Heart attack Brother     SOCIAL HISTORY:  Social History   Socioeconomic History  . Marital status: Married    Spouse name: Not on file  . Number of children: 2  . Years of education: 8th grade  . Highest education level: Not on file  Social Needs  . Financial resource strain: Not on file  . Food insecurity - worry: Not on file  . Food insecurity - inability: Not on file  . Transportation needs - medical: Not on file  . Transportation needs - non-medical: Not on file  Occupational History  . Occupation: Retired  Tobacco Use  . Smoking status: Never Smoker  . Smokeless tobacco: Never Used  Substance and Sexual Activity  . Alcohol use: No  . Drug use: No  . Sexual activity: Not on file  Other Topics Concern  . Not on file  Social History Narrative   Lives in [place] with his wife.   Right-handed.   Retired Furniture conservator/restorer.   No caffeine use.     PHYSICAL EXAM   Vitals:   09/09/17 1031  BP: (!) 128/58  Pulse: (!) 59  Weight: 115 lb 8 oz (52.4 kg)  Height: 5\' 6"  (1.676 m)    Not recorded      Body mass index is 18.64  kg/m.  PHYSICAL EXAMNIATION:  Gen: NAD, conversant, well nourised, obese, well groomed                     Cardiovascular: Regular rate rhythm, no peripheral edema, warm, nontender. Eyes: Conjunctivae clear without exudates or hemorrhage Neck: Supple, no carotid bruits. Pulmonary: Clear to auscultation bilaterally   NEUROLOGICAL EXAM:  MMSE - Mini Mental State Exam 09/09/2017  Orientation to time 0  Orientation to Place 3  Registration 3  Attention/ Calculation 2  Recall 0  Language- name 2 objects 2  Language- repeat 1  Language- follow 3 step command 2  Language- read & follow direction 1  Write a sentence  1  Copy design 0  Total score 15       CRANIAL NERVES: decreased facial expression CN II: Visual fields are full to confrontation.   . Pupils are round equal and briskly reactive to light. CN III, IV, VI: extraocular movement are normal. No ptosis. CN V: Facial sensation is intact to pinprick in all 3 divisions bilaterally. Corneal responses are intact.  CN VII: Face is symmetric with normal eye closure and smile. CN VIII: Hearing is normal to rubbing fingers CN IX, X: Palate elevates symmetrically. Phonation is normal. CN XI: Head turning and shoulder shrug are intact CN XII: Tongue is midline with normal movements and no atrophy.  MOTOR: He has mild to moderate limb rigidity, right worse than left, in addition, he has moderate bilateral ankle dorsiflexion weakness.  Bradykinesia on rapid wrist opening closing, foot tapping  REFLEXES: Reflexes are hypoactive and symmetric at the biceps, triceps, knees, and ankles. Plantar responses are flexor.  SENSORY: Length dependent decrease to light touch, pinprick, and vibratory sensation to ankle level  COORDINATION: Rapid alternating movements and fine finger movements are intact. There is no dysmetria on finger-to-nose and heel-knee-shin.    GAIT/STANCE: He needs pushed up to get up from seated position, bilateral  foot drop, unsteady cautious  DIAGNOSTIC DATA (LABS, IMAGING, TESTING) - I reviewed patient records, labs, notes, testing and imaging myself where available.   ASSESSMENT AND PLAN  AFSHIN CHRYSTAL is a 83 y.o. male   Epilepsy Chronic atrial fibrillation on Coumadin  Phenobarbital is an enzyme inducer, it is not a good option for patients taking Coumadin, he has been seizure-free for many years, will repeat EEG, tapering off phenobarbital slowly, starting Keppra 500 mg twice a day  But I do not think phenobarbital contributed to his chronic kidney disease  gait abnormality  Multifactorial, mild parkinsonian features, need to rule out lumbosacral radiculopathy  MRI of lumbar  Dementia  Mini-Mental status examination is 15 out of 30 today  Eddie Park, M.D. Ph.D.  [facility] [address], [place], [place] [address] Ph: [phone] Fax: [phone]  CC: Leighton Ruff, MD, Madelon Lips, MD

## 2017-09-09 NOTE — Patient Instructions (Signed)
Weeks Phenobarbital 97.2mg  Levetiracetam 500mg   1st week Every M, W, F, Sunday One tab twice a day  2nd week Every M, W, F, Sunday One tab twice a day  3rd week Every M, W, F One tab twice a day  4th week Every M.W, F One tab twice a day  5th week Every M, F One tab twice a day  6th week Every M One tab twice a day  7th week Every M One tab twice a day  8th week Stop

## 2017-09-17 ENCOUNTER — Ambulatory Visit (INDEPENDENT_AMBULATORY_CARE_PROVIDER_SITE_OTHER): Payer: Medicare Other | Admitting: *Deleted

## 2017-09-17 DIAGNOSIS — Z7901 Long term (current) use of anticoagulants: Secondary | ICD-10-CM

## 2017-09-17 DIAGNOSIS — Z5181 Encounter for therapeutic drug level monitoring: Secondary | ICD-10-CM | POA: Diagnosis not present

## 2017-09-17 DIAGNOSIS — I482 Chronic atrial fibrillation: Secondary | ICD-10-CM | POA: Diagnosis not present

## 2017-09-17 DIAGNOSIS — I4891 Unspecified atrial fibrillation: Secondary | ICD-10-CM | POA: Diagnosis not present

## 2017-09-17 DIAGNOSIS — I4821 Permanent atrial fibrillation: Secondary | ICD-10-CM

## 2017-09-17 LAB — POCT INR: INR: 2.8

## 2017-09-17 NOTE — Patient Instructions (Signed)
Description   Continue taking same dose of Coumadin 10 mg daily except 7.5mg  on Sundays, Tuesdays, and Thursdays.  Recheck INR in 2 weeks (tapering phenobarbital & starting keppra).  Call us with any concerns or medication changes # [phone] [facility], Main # [phone].

## 2017-09-29 ENCOUNTER — Other Ambulatory Visit: Payer: Self-pay | Admitting: Physician Assistant

## 2017-09-30 ENCOUNTER — Ambulatory Visit (INDEPENDENT_AMBULATORY_CARE_PROVIDER_SITE_OTHER): Payer: Medicare Other | Admitting: *Deleted

## 2017-09-30 DIAGNOSIS — I495 Sick sinus syndrome: Secondary | ICD-10-CM

## 2017-09-30 NOTE — Progress Notes (Signed)
Remote pacemaker transmission.   

## 2017-10-01 ENCOUNTER — Ambulatory Visit (INDEPENDENT_AMBULATORY_CARE_PROVIDER_SITE_OTHER): Payer: Medicare Other | Admitting: *Deleted

## 2017-10-01 ENCOUNTER — Encounter: Payer: Self-pay | Admitting: Cardiology

## 2017-10-01 DIAGNOSIS — Z5181 Encounter for therapeutic drug level monitoring: Secondary | ICD-10-CM | POA: Diagnosis not present

## 2017-10-01 DIAGNOSIS — Z7901 Long term (current) use of anticoagulants: Secondary | ICD-10-CM

## 2017-10-01 DIAGNOSIS — I4891 Unspecified atrial fibrillation: Secondary | ICD-10-CM

## 2017-10-01 DIAGNOSIS — I482 Chronic atrial fibrillation: Secondary | ICD-10-CM

## 2017-10-01 DIAGNOSIS — I4821 Permanent atrial fibrillation: Secondary | ICD-10-CM

## 2017-10-01 LAB — POCT INR: INR: 2.4

## 2017-10-01 NOTE — Patient Instructions (Signed)
Description   Continue taking same dose of Coumadin 10 mg daily except 7.5mg  on Sundays, Tuesdays, and Thursdays.  Recheck INR in 3 weeks (tapering phenobarbital & continuing  keppra).  Call us with any concerns or medication changes # [phone] [facility], Main # [phone]. Phenobarb will be decreased to 2 times a week in 2 weeks

## 2017-10-08 LAB — CUP PACEART REMOTE DEVICE CHECK
Brady Statistic RV Percent Paced: 90 %
Implantable Lead Implant Date: 20130917
Implantable Lead Model: 4469
Implantable Lead Serial Number: 448722
Lead Channel Impedance Value: 386 Ohm
Lead Channel Setting Pacing Amplitude: 2.4 V
Lead Channel Setting Pacing Pulse Width: 0.4 ms
Lead Channel Setting Sensing Sensitivity: 2.5 mV
MDC IDC LEAD IMPLANT DT: 20130917
MDC IDC LEAD LOCATION: 753859
MDC IDC LEAD LOCATION: 753860
MDC IDC LEAD SERIAL: 484382
MDC IDC MSMT BATTERY REMAINING LONGEVITY: 90 mo
MDC IDC MSMT BATTERY REMAINING PERCENTAGE: 93 %
MDC IDC MSMT LEADCHNL RV IMPEDANCE VALUE: 375 Ohm
MDC IDC PG IMPLANT DT: 20130917
MDC IDC SESS DTM: 20190109054100
MDC IDC STAT BRADY RA PERCENT PACED: 0 %
Pulse Gen Serial Number: 111635

## 2017-10-20 ENCOUNTER — Encounter: Payer: Self-pay | Admitting: Neurology

## 2017-10-21 ENCOUNTER — Telehealth: Payer: Self-pay | Admitting: Neurology

## 2017-10-21 ENCOUNTER — Other Ambulatory Visit: Payer: BLUE CROSS/BLUE SHIELD

## 2017-10-21 NOTE — Telephone Encounter (Signed)
Spoke to patient's wife on HIPAA - she is aware of Dr. Rhea Belton response below and verbalized understanding.

## 2017-10-21 NOTE — Telephone Encounter (Signed)
Please call patient, is in the process of tapering down phenobarbital, from email, he has missed 1 days dose, it is okay to continue the tapering schedule without taking extra phenobarbital that he has missed.

## 2017-10-22 ENCOUNTER — Ambulatory Visit (INDEPENDENT_AMBULATORY_CARE_PROVIDER_SITE_OTHER): Payer: Medicare Other

## 2017-10-22 DIAGNOSIS — Z5181 Encounter for therapeutic drug level monitoring: Secondary | ICD-10-CM

## 2017-10-22 DIAGNOSIS — Z7901 Long term (current) use of anticoagulants: Secondary | ICD-10-CM

## 2017-10-22 DIAGNOSIS — I4891 Unspecified atrial fibrillation: Secondary | ICD-10-CM | POA: Diagnosis not present

## 2017-10-22 DIAGNOSIS — I4821 Permanent atrial fibrillation: Secondary | ICD-10-CM

## 2017-10-22 DIAGNOSIS — I482 Chronic atrial fibrillation: Secondary | ICD-10-CM

## 2017-10-22 LAB — POCT INR: INR: 2.2

## 2017-10-22 NOTE — Patient Instructions (Signed)
Description   Continue taking same dose of Coumadin 10 mg daily except 7.5mg  on Sundays, Tuesdays, and Thursdays.  Recheck INR in 3 weeks (tapering phenobarbital & continuing  keppra).  Call us with any concerns or medication changes # [phone] [facility], Main # [phone]. Phenobarb will be decreased to once weekly x 2 weeks, then Off.

## 2017-10-28 ENCOUNTER — Ambulatory Visit (INDEPENDENT_AMBULATORY_CARE_PROVIDER_SITE_OTHER): Payer: Medicare Other | Admitting: Neurology

## 2017-10-28 DIAGNOSIS — R569 Unspecified convulsions: Secondary | ICD-10-CM | POA: Diagnosis not present

## 2017-10-28 DIAGNOSIS — M4807 Spinal stenosis, lumbosacral region: Secondary | ICD-10-CM

## 2017-10-28 DIAGNOSIS — R269 Unspecified abnormalities of gait and mobility: Secondary | ICD-10-CM

## 2017-10-30 NOTE — Procedures (Signed)
   HISTORY: 84 year old male with history of seizure, was treated with phenobarbital in the past,  TECHNIQUE:  16 channel EEG was performed based on standard 10-16 international system. One channel was dedicated to EKG, which has demonstrates normal sinus rhythm of 60 beats per minutes.  Upon awakening, the posterior background activity was mildly dysrhythmic, 6-7 Hz, reactive to eye opening and closure.  There was no evidence of epileptiform discharge.  Photic stimulation was performed, which induced a symmetric photic driving.  Hyperventilation was not performedn  No sleep was achieved.  CONCLUSION: This is an abnormal EEG.  There is electrodiagnostic evidence of mild generalized slowing, indicating bi-hemisphere malfunction, there is no evidence of epileptiform discharge.  Marcial Pacas, M.D. Ph.D.  [facility] [place], [place] [address] Phone: [phone] Fax:      [phone]

## 2017-11-11 ENCOUNTER — Ambulatory Visit: Payer: Medicare Other | Admitting: Neurology

## 2017-11-12 ENCOUNTER — Ambulatory Visit (INDEPENDENT_AMBULATORY_CARE_PROVIDER_SITE_OTHER): Payer: Medicare Other | Admitting: Neurology

## 2017-11-12 ENCOUNTER — Ambulatory Visit (INDEPENDENT_AMBULATORY_CARE_PROVIDER_SITE_OTHER): Payer: Medicare Other | Admitting: *Deleted

## 2017-11-12 ENCOUNTER — Other Ambulatory Visit: Payer: Self-pay

## 2017-11-12 ENCOUNTER — Encounter: Payer: Self-pay | Admitting: Neurology

## 2017-11-12 VITALS — BP 117/50 | HR 60 | Resp 20 | Ht 66.0 in | Wt 112.0 lb

## 2017-11-12 DIAGNOSIS — F0390 Unspecified dementia without behavioral disturbance: Secondary | ICD-10-CM | POA: Diagnosis not present

## 2017-11-12 DIAGNOSIS — I482 Chronic atrial fibrillation: Secondary | ICD-10-CM | POA: Diagnosis not present

## 2017-11-12 DIAGNOSIS — I4821 Permanent atrial fibrillation: Secondary | ICD-10-CM

## 2017-11-12 DIAGNOSIS — Z7901 Long term (current) use of anticoagulants: Secondary | ICD-10-CM | POA: Diagnosis not present

## 2017-11-12 DIAGNOSIS — Z5181 Encounter for therapeutic drug level monitoring: Secondary | ICD-10-CM

## 2017-11-12 DIAGNOSIS — R569 Unspecified convulsions: Secondary | ICD-10-CM | POA: Diagnosis not present

## 2017-11-12 DIAGNOSIS — I4891 Unspecified atrial fibrillation: Secondary | ICD-10-CM | POA: Diagnosis not present

## 2017-11-12 LAB — POCT INR: INR: 3.3

## 2017-11-12 NOTE — Patient Instructions (Addendum)
  Description   Do not take coumadin today Feb 21st then continue taking same dose of Coumadin 10 mg daily except 7.5mg  on Sundays, Tuesdays, and Thursdays.  Recheck INR in 2 weeks .  Call us with any concerns or medication changes # [phone] [facility], Main # [phone]. Phenobarb has been discontinued Keppra continues Try to do couple servings of greens a week

## 2017-11-12 NOTE — Progress Notes (Addendum)
PATIENT: Eddie Park DOB: September 19, 1927  Chief Complaint  Patient presents with  . Seizures    Rm. 4. Denies sz. since last ov and sts is compliant with Keppra. MMSE today 18/30 plus 6 animals/fim  . Dementia     HISTORICAL  Eddie Park is a 84 year old male, seen in refer by his nephrologist Dr. Madelon Lips and his primary care physician Dr. Leighton Ruff for evaluation of seizure, initial evaluation was on September 09, 2017.  I reviewed and summarized the referring note, he has history of chronic renal disease, atrial fibrillation, coronary artery disease, hypertension, prostate cancer, anemia related to chronic renal failure, seizure disorder,  He reported history of seizures since 1999, was treated with phenobarbital 97.2 mg since then as well, he has 2 different kind of seizure, generalized tonic-clonic seizure without warning signs, or staring spells, last seizure was in 1999,  Chronic kidney disease has been under the care of his primary care for many years, was recently referred to nephrologist Dr. Madelon Lips, who is wondering if his long-term phenobarbital use contributed to his kidney disease,  I looked up Micromedex, there was no evidence to suggest that low-dose long-term phenobarbital use is related to kidney disease, but he has history of atrial fibrillation, taking Coumadin, phenobarbital is a enzyme inducer, is not a good antiepileptic medication in this scenario.  He is a retired Furniture conservator/restorer, since 2015, he was noted to have gradual onset memory loss, gait abnormality, I fell in 2017, also had urinary urgency, occasional incontinence, which he attributed to his prostate problem.  Laboratory evaluations, INR 2.5, CBC showed hemoglobin of 9.5, creatinine 1.4 1,  I personally reviewed a CT head in October 2017, chronic right cerebellar infarction, small vessel disease, mild cervical degenerative changes,  UPDATE Nov 12 2017: He was tapered off  phenobarbital, now taking Keppra 500mg  bid, no significant side effect. He continues to have mild memory loss, gait abnormality, was noted to have distal weakness, urinary frequency, previously ordered MRI of lumbar spine to evaluate for possible lumbar radiculopathy, but wife declined,  REVIEW OF SYSTEMS: Full 14 system review of systems performed and notable only for as above  ALLERGIES: No Known Allergies  HOME MEDICATIONS: Current Outpatient Medications  Medication Sig Dispense Refill  . cholecalciferol (VITAMIN D) 1000 UNITS tablet Take 2,000 Units by mouth daily.    . fish oil-omega-3 fatty acids 1000 MG capsule Take 2 g by mouth daily.    [name] JANTOVEN 5 MG tablet take as directed by coumadin clinic 180 tablet 1  . levETIRAcetam (KEPPRA) 500 MG tablet Take 1 tablet (500 mg total) by mouth 2 (two) times daily. 60 tablet 11  . lisinopril (PRINIVIL,ZESTRIL) 2.5 MG tablet TAKE 1 TABLET BY MOUTH DAILY 90 tablet 2  . Multiple Vitamin (MULTIVITAMIN WITH MINERALS) TABS Take 1 tablet by mouth daily.    . sertraline (ZOLOFT) 100 MG tablet Take 100 mg by mouth daily.    [name] torsemide (DEMADEX) 20 MG tablet TAKE ONE TABLET BY MOUTH ONE TIME DAILY  90 tablet 1  . vitamin B-12 (CYANOCOBALAMIN) 1000 MCG tablet Take 1,000 mcg by mouth daily.    [name] PHENobarbital (LUMINAL) 97.2 MG tablet Take 97.2 mg by mouth at bedtime.     No current facility-administered medications for this visit.     PAST MEDICAL HISTORY: Past Medical History:  Diagnosis Date  . Anticoagulated on Coumadin   . Anxiety   . Aortic insufficiency    a. mod-severe by  echo 2014. b. mod by echo 2017.  [name] Chronic anemia   . Chronic combined systolic and diastolic CHF (congestive heart failure) ([place])   . CKD (chronic kidney disease), stage III ([place])   . Coronary artery disease    a. s/p MI 1995.  [name] Hard of hearing   . Hypertension   . Hyponatremia   . Mitral regurgitation    a. prev severe, more recently mild in 2017 -> not felt to  be candidate for any kind of surgical repair.  [name] Permanent atrial fibrillation ([place])   . Prostate cancer ([place]) 2002  . Seizure disorder ([place])   . Tachycardia-bradycardia syndrome ([place])    a. w/ sycope - initial implant 2007, last gen change 2013 with BSX dual chamber PPM.    PAST SURGICAL HISTORY: Past Surgical History:  Procedure Laterality Date  . artificial urinary sphincter    . HERNIA REPAIR  2002  . PACEMAKER INSERTION  04/29/06   Generator change (BSc) by Dr Rayann Heman 06/21/12  . PERMANENT PACEMAKER GENERATOR CHANGE N/A 06/08/2012   Procedure: PERMANENT PACEMAKER GENERATOR CHANGE;  Surgeon: Thompson Grayer, MD;  Location: [facility] CATH LAB;  Service: Cardiovascular;  Laterality: N/A;  . PROSTATE SURGERY    . PROSTATECTOMY  2002    FAMILY HISTORY: Family History  Problem Relation Age of Onset  . Colon polyps Brother   . Heart disease Mother   . Heart attack Mother   . Heart attack Father   . Heart disease Father   . Diabetes Father        Also, three of his brothers had diabetes.  [name] Heart attack Brother     SOCIAL HISTORY:  Social History   Socioeconomic History  . Marital status: Married    Spouse name: Not on file  . Number of children: 2  . Years of education: 8th grade  . Highest education level: Not on file  Social Needs  . Financial resource strain: Not on file  . Food insecurity - worry: Not on file  . Food insecurity - inability: Not on file  . Transportation needs - medical: Not on file  . Transportation needs - non-medical: Not on file  Occupational History  . Occupation: Retired  Tobacco Use  . Smoking status: Never Smoker  . Smokeless tobacco: Never Used  Substance and Sexual Activity  . Alcohol use: No  . Drug use: No  . Sexual activity: Not on file  Other Topics Concern  . Not on file  Social History Narrative   Lives in [place] with his wife.   Right-handed.   Retired Furniture conservator/restorer.   No caffeine use.     PHYSICAL EXAM   Vitals:   11/12/17  0729  BP: (!) 117/50  Pulse: 60  Resp: 20  Weight: 112 lb (50.8 kg)  Height: 5\' 6"  (1.676 m)    Not recorded      Body mass index is 18.08 kg/m.  PHYSICAL EXAMNIATION:  Gen: NAD, conversant, well nourised, obese, well groomed                     Cardiovascular: Regular rate rhythm, no peripheral edema, warm, nontender. Eyes: Conjunctivae clear without exudates or hemorrhage Neck: Supple, no carotid bruits. Pulmonary: Clear to auscultation bilaterally   NEUROLOGICAL EXAM:  MMSE - Mini Mental State Exam 11/12/2017 09/09/2017  Orientation to time 3 0  Orientation to Place 3 3  Registration 2 3  Attention/ Calculation 0 2  Recall 2 0  Language- name 2 objects 2 2  Language- repeat 1 1  Language- follow 3 step command 3 2  Language- read & follow direction 1 1  Write a sentence 1 1  Copy design 0 0  Total score 18 15   Animal naming 6.   CRANIAL NERVES: decreased facial expression CN II: Visual fields are full to confrontation.   . Pupils are round equal and briskly reactive to light. CN III, IV, VI: extraocular movement are normal. No ptosis. CN V: Facial sensation is intact to pinprick in all 3 divisions bilaterally. Corneal responses are intact.  CN VII: Face is symmetric with normal eye closure and smile. CN VIII: Hearing is normal to rubbing fingers CN IX, X: Palate elevates symmetrically. Phonation is normal. CN XI: Head turning and shoulder shrug are intact CN XII: Tongue is midline with normal movements and no atrophy.  MOTOR: He has mild to moderate bilateral ankle dorsiflexion weakness  REFLEXES: Reflexes are hypoactive and symmetric at the biceps, triceps, knees, and ankles. Plantar responses are flexor.  SENSORY: Length dependent decrease to light touch, pinprick, and vibratory sensation to ankle level  COORDINATION: Rapid alternating movements and fine finger movements are intact. There is no dysmetria on finger-to-nose and heel-knee-shin.     GAIT/STANCE: He needs pushed up to get up from seated position, bilateral foot drop, unsteady cautious  DIAGNOSTIC DATA (LABS, IMAGING, TESTING) - I reviewed patient records, labs, notes, testing and imaging myself where available.   ASSESSMENT AND PLAN  DAVARIUS RIDENER is a 84 y.o. male   Epilepsy Chronic atrial fibrillation on Coumadin  Phenobarbital is an enzyme inducer, it is not a good option for patients taking Coumadin, he has been seizure-free for many years,   Repeat EEG showed generalized slowing, no evidence of epileptiform discharge,  He was able to successfully tapering off phenobarbital slowly since December 2018, tolerating  Keppra 500 mg twice a day well  gait abnormality  Multifactorial, mild parkinsonian features, possible lumbosacral radiculopathy  Dementia    Marcial Pacas, M.D. Ph.D.  [facility] [address], [place], [place] [address] Ph: [phone] Fax: [phone]  CC: Leighton Ruff, MD, Madelon Lips, MD  Marcial Pacas, M.D. Ph.D.  [facility] [place], [place] [address] Phone: [phone] Fax:      [phone]

## 2017-11-23 ENCOUNTER — Encounter: Payer: Self-pay | Admitting: Interventional Cardiology

## 2017-11-23 ENCOUNTER — Ambulatory Visit (INDEPENDENT_AMBULATORY_CARE_PROVIDER_SITE_OTHER): Payer: Medicare Other | Admitting: Interventional Cardiology

## 2017-11-23 ENCOUNTER — Ambulatory Visit (INDEPENDENT_AMBULATORY_CARE_PROVIDER_SITE_OTHER): Payer: Medicare Other | Admitting: *Deleted

## 2017-11-23 ENCOUNTER — Ambulatory Visit: Payer: Medicare Other | Admitting: Interventional Cardiology

## 2017-11-23 VITALS — BP 100/48 | HR 61 | Ht 66.0 in | Wt 110.0 lb

## 2017-11-23 DIAGNOSIS — Z7901 Long term (current) use of anticoagulants: Secondary | ICD-10-CM | POA: Diagnosis not present

## 2017-11-23 DIAGNOSIS — I482 Chronic atrial fibrillation: Secondary | ICD-10-CM

## 2017-11-23 DIAGNOSIS — I340 Nonrheumatic mitral (valve) insufficiency: Secondary | ICD-10-CM | POA: Diagnosis not present

## 2017-11-23 DIAGNOSIS — I4891 Unspecified atrial fibrillation: Secondary | ICD-10-CM | POA: Diagnosis not present

## 2017-11-23 DIAGNOSIS — Z5181 Encounter for therapeutic drug level monitoring: Secondary | ICD-10-CM | POA: Diagnosis not present

## 2017-11-23 DIAGNOSIS — I5042 Chronic combined systolic (congestive) and diastolic (congestive) heart failure: Secondary | ICD-10-CM | POA: Diagnosis not present

## 2017-11-23 DIAGNOSIS — I4821 Permanent atrial fibrillation: Secondary | ICD-10-CM

## 2017-11-23 LAB — POCT INR: INR: 4.8

## 2017-11-23 NOTE — Patient Instructions (Signed)
Description   Skip today and tomorrow's dose, then start taking 1.5 tablets everyday except 2 tablets on  Wednesdays and Saturdays.  Recheck INR in 10 days.   Call us with any concerns or medication changes # [phone] Coumadin Clinic, Main # [phone]. Phenobarb has been discontinued Keppra continues.

## 2017-11-23 NOTE — Progress Notes (Signed)
Cardiology Office Note   Date:  11/23/2017   ID:  Eddie Park, DOB [birth year], MRN [id number]  PCP:  Leighton Ruff, MD    No chief complaint on file. mitral regurgitation   Wt Readings from Last 3 Encounters:  11/23/17 110 lb (49.9 kg)  11/12/17 112 lb (50.8 kg)  09/09/17 115 lb 8 oz (52.4 kg)       History of Present Illness: Eddie Park is a 84 y.o. male   with AFib, mitral regurgitation, cardiomyopathy, and pacer dependent. He is now in AFib 100% of the time by pacer check. EF 45% in 2011.  Echo in 11/17 showed: Left ventricle: The cavity size was normal. There was mild concentric hypertrophy. Systolic function was mildly to moderately reduced. The estimated ejection fraction was in the range of 40% to 45%. Mild diffuse hypokinesis with no identifiable regional variations. The study is not technically sufficient to allow evaluation of LV diastolic function. - Ventricular septum: Septal motion showed paradox. These changes are consistent with right ventricular pacing. - Aortic valve: There was moderate regurgitation directed centrally in the LVOT. - Mitral valve: There was mild regurgitation directed centrally. - Left atrium: The atrium was severely dilated. - Right ventricle: The cavity size was mildly dilated. - Tricuspid valve: There was moderate regurgitation. - Pulmonary arteries: Systolic pressure was mildly increased. PA peak pressure: 39 mm Hg (S). - Pericardium, extracardiac: A small pericardial effusion was identified posterior to the heart. The fluid had no internal echoes.There was no evidence of hemodynamic compromise. There was a left pleural effusion.  In the past, he has had issues with falls, but this decreased with less Lasix.  He uses a cane.    Still taking Coumadin.  No other bleeding issues.    Fluid management has been stable of late.  In 10/17, he required increased diuretics for LE edema and more  SHOB.  He was hospitalized in 11/17 and diuresed 3.6 L.   Today, INR was 4.8 after medication was changed to Keppra.  He has had some nasal congestion.    Denies : Chest pain. Dizziness. Leg edema. Nitroglycerin use. Orthopnea. Palpitations. Paroxysmal nocturnal dyspnea. Shortness of breath. Syncope. No recent falls.  He gets around slowly. He only walks with a family member.      Past Medical History:  Diagnosis Date  . Anticoagulated on Coumadin   . Anxiety   . Aortic insufficiency    a. mod-severe by echo 2014. b. mod by echo 2017.  [name] Chronic anemia   . Chronic combined systolic and diastolic CHF (congestive heart failure) ([place])   . CKD (chronic kidney disease), stage III ([place])   . Coronary artery disease    a. s/p MI 1995.  [name] Hard of hearing   . Hypertension   . Hyponatremia   . Mitral regurgitation    a. prev severe, more recently mild in 2017 -> not felt to be candidate for any kind of surgical repair.  [name] Permanent atrial fibrillation ([place])   . Prostate cancer ([place]) 2002  . Seizure disorder ([place])   . Tachycardia-bradycardia syndrome ([place])    a. w/ sycope - initial implant 2007, last gen change 2013 with BSX dual chamber PPM.    Past Surgical History:  Procedure Laterality Date  . artificial urinary sphincter    . HERNIA REPAIR  2002  . PACEMAKER INSERTION  04/29/06   Generator change (BSc) by Dr Rayann Heman 06/21/12  . PERMANENT PACEMAKER GENERATOR CHANGE N/A  06/08/2012   Procedure: PERMANENT PACEMAKER GENERATOR CHANGE;  Surgeon: Thompson Grayer, MD;  Location[facility] CATH LAB;  Service: Cardiovascular;  Laterality: N/A;  . PROSTATE SURGERY    . PROSTATECTOMY  2002     Current Outpatient Medications  Medication Sig Dispense Refill  . cholecalciferol (VITAMIN D) 1000 UNITS tablet Take 2,000 Units by mouth daily.    . fish oil-omega-3 fatty acids 1000 MG capsule Take 2 g by mouth daily.    [name] JANTOVEN 5 MG tablet take as directed by coumadin clinic 180 tablet 1  . levETIRAcetam  (KEPPRA) 500 MG tablet Take 1 tablet (500 mg total) by mouth 2 (two) times daily. 60 tablet 11  . lisinopril (PRINIVIL,ZESTRIL) 2.5 MG tablet TAKE 1 TABLET BY MOUTH DAILY 90 tablet 2  . Multiple Vitamin (MULTIVITAMIN WITH MINERALS) TABS Take 1 tablet by mouth daily.    . sertraline (ZOLOFT) 100 MG tablet Take 100 mg by mouth daily.    [name] torsemide (DEMADEX) 20 MG tablet TAKE ONE TABLET BY MOUTH ONE TIME DAILY  90 tablet 1  . vitamin B-12 (CYANOCOBALAMIN) 1000 MCG tablet Take 1,000 mcg by mouth daily.     No current facility-administered medications for this visit.     Allergies:   Patient has no known allergies.    Social History:  The patient  reports that  has never smoked. he has never used smokeless tobacco. He reports that he does not drink alcohol or use drugs.   Family History:  The patient's family history includes Colon polyps in his brother; Diabetes in his father; Heart attack in his brother, father, and mother; Heart disease in his father and mother.    ROS:  Please see the history of present illness.   Otherwise, review of systems are positive for general unsteadiness.   All other systems are reviewed and negative.    PHYSICAL EXAM: VS:  BP (!) 100/48   Pulse 61   Ht 5\' 6"  (1.676 m)   Wt 110 lb (49.9 kg)   SpO2 98%   BMI 17.75 kg/m  , BMI Body mass index is 17.75 kg/m. GEN: Well nourished, well developed, in no acute distress  HEENT: normal  Neck: no JVD, carotid bruits, or masses Cardiac: RRR; no murmurs, rubs, or gallops,no edema  Respiratory:  clear to auscultation bilaterally, normal work of breathing GI: soft, nontender, nondistended, + BS MS: no deformity or atrophy  Skin: warm and dry, no rash Neuro:  Strength and sensation are intact Psych: euthymic mood, full affect   EKG:   The ekg in 8/18 today demonstrates paced rhythm   Recent Labs: 05/21/2017: BUN 35; Creatinine, Ser 1.41; Hemoglobin 9.5; Platelets 131; Potassium 4.0; Sodium 136   Lipid  Panel    Component Value Date/Time   CHOL  09/26/2009 2344    142        ATP III CLASSIFICATION:  <200     mg/dL   Desirable  200-239  mg/dL   Borderline High  >=240    mg/dL   High          TRIG 65 09/26/2009 2344   HDL 42 09/26/2009 2344   CHOLHDL 3.4 09/26/2009 2344   VLDL 13 09/26/2009 2344   LDLCALC  09/26/2009 2344    87        Total Cholesterol/HDL:CHD Risk Coronary Heart Disease Risk Table                     Men  Women  1/2 Average Risk   3.4   3.3  Average Risk       5.0   4.4  2 X Average Risk   9.6   7.1  3 X Average Risk  23.4   11.0        Use the calculated Patient Ratio above and the CHD Risk Table to determine the patient's CHD Risk.        ATP III CLASSIFICATION (LDL):  <100     mg/dL   Optimal  100-129  mg/dL   Near or Above                    Optimal  130-159  mg/dL   Borderline  160-189  mg/dL   High  >190     mg/dL   Very High     Other studies Reviewed: Additional studies/ records that were reviewed today with results demonstrating: LDL 104 in 11/18   ASSESSMENT AND PLAN:  1. AFib: Coumadin for stroke prevention.  TOlerating this well.  Coumadin dose being adjusted.  2. HTN: Controlled. COntinue current meds.  Systolic around 354 mm Hg.   3. Chronic systolic/ diastolic heart failure: Appears euvolemic. 4. Mitral regurgitation: No sign of CHF.  Briefly discussed mitraclip, but not interested given lack of symptoms.  5. He needs to avoid falls.    Current medicines are reviewed at length with the patient today.  The patient concerns regarding his medicines were addressed.  The following changes have been made:  No change  Labs/ tests ordered today include:  No orders of the defined types were placed in this encounter.   Recommend 150 minutes/week of aerobic exercise Low fat, low carb, high fiber diet recommended  Disposition:   FU in 1 year   Signed, Larae Grooms, MD  11/23/2017 3:51 PM    [facility] [place], [place], [place]  [address] Phone: [phone]; Fax: [phone]

## 2017-11-23 NOTE — Patient Instructions (Signed)

## 2017-11-24 ENCOUNTER — Other Ambulatory Visit: Payer: Self-pay | Admitting: Interventional Cardiology

## 2017-12-03 ENCOUNTER — Ambulatory Visit (INDEPENDENT_AMBULATORY_CARE_PROVIDER_SITE_OTHER): Payer: Medicare Other | Admitting: Pharmacist

## 2017-12-03 DIAGNOSIS — I482 Chronic atrial fibrillation: Secondary | ICD-10-CM | POA: Diagnosis not present

## 2017-12-03 DIAGNOSIS — Z7901 Long term (current) use of anticoagulants: Secondary | ICD-10-CM | POA: Diagnosis not present

## 2017-12-03 DIAGNOSIS — I4891 Unspecified atrial fibrillation: Secondary | ICD-10-CM

## 2017-12-03 DIAGNOSIS — Z5181 Encounter for therapeutic drug level monitoring: Secondary | ICD-10-CM | POA: Diagnosis not present

## 2017-12-03 DIAGNOSIS — I4821 Permanent atrial fibrillation: Secondary | ICD-10-CM

## 2017-12-03 LAB — POCT INR: INR: 4

## 2017-12-03 NOTE — Patient Instructions (Signed)
Description   Skip today's dose, then start taking 1.5 tablets everyday except 2 tablets on  Wednesdays.  Recheck INR in 10 days.   Call us with any concerns or medication changes # [phone] [facility], Main # [phone]. Phenobarb has been discontinued Keppra continues.

## 2017-12-10 ENCOUNTER — Telehealth: Payer: Self-pay | Admitting: Neurology

## 2017-12-10 ENCOUNTER — Other Ambulatory Visit: Payer: Self-pay | Admitting: *Deleted

## 2017-12-10 MED ORDER — LEVETIRACETAM 500 MG PO TABS: 500.0000 mg | ORAL_TABLET | Freq: Two times a day (BID) | ORAL | 3 refills | Status: DC

## 2017-12-10 NOTE — Telephone Encounter (Signed)
Pt wife(on DPR) has asked for a call to discuss levETIRAcetam (KEPPRA) 500 MG tablet

## 2017-12-10 NOTE — Telephone Encounter (Signed)
The cost of the patient's medication has gone up at [facility].  She would like a 90-day supply sent to [facility] ($29.40 w/ goodrx coupon).  New rx sent to [facility].

## 2017-12-15 ENCOUNTER — Ambulatory Visit (INDEPENDENT_AMBULATORY_CARE_PROVIDER_SITE_OTHER): Payer: Medicare Other | Admitting: *Deleted

## 2017-12-15 DIAGNOSIS — I4891 Unspecified atrial fibrillation: Secondary | ICD-10-CM

## 2017-12-15 DIAGNOSIS — I482 Chronic atrial fibrillation: Secondary | ICD-10-CM | POA: Diagnosis not present

## 2017-12-15 DIAGNOSIS — Z5181 Encounter for therapeutic drug level monitoring: Secondary | ICD-10-CM | POA: Diagnosis not present

## 2017-12-15 DIAGNOSIS — I4821 Permanent atrial fibrillation: Secondary | ICD-10-CM

## 2017-12-15 DIAGNOSIS — Z7901 Long term (current) use of anticoagulants: Secondary | ICD-10-CM | POA: Diagnosis not present

## 2017-12-15 LAB — POCT INR: INR: 5.5

## 2017-12-15 NOTE — Patient Instructions (Addendum)
Description   Skip today and tomorrow's dose take take 1/2 tablet on Thursday then  start taking 1.5 tablets everyday.  Recheck INR in 10 days. Start eating 2 dark green leafy veggies twice a week consistently.  Call us with any concerns or medication changes # [phone] [facility], Main # [phone]. Phenobarb has been discontinued Keppra continues.

## 2017-12-21 ENCOUNTER — Other Ambulatory Visit: Payer: Self-pay | Admitting: Interventional Cardiology

## 2017-12-24 ENCOUNTER — Ambulatory Visit (INDEPENDENT_AMBULATORY_CARE_PROVIDER_SITE_OTHER): Payer: Medicare Other | Admitting: *Deleted

## 2017-12-24 DIAGNOSIS — I482 Chronic atrial fibrillation: Secondary | ICD-10-CM

## 2017-12-24 DIAGNOSIS — Z7901 Long term (current) use of anticoagulants: Secondary | ICD-10-CM

## 2017-12-24 DIAGNOSIS — Z5181 Encounter for therapeutic drug level monitoring: Secondary | ICD-10-CM

## 2017-12-24 DIAGNOSIS — I4821 Permanent atrial fibrillation: Secondary | ICD-10-CM

## 2017-12-24 DIAGNOSIS — I4891 Unspecified atrial fibrillation: Secondary | ICD-10-CM

## 2017-12-24 LAB — POCT INR: INR: 2.9

## 2017-12-24 NOTE — Patient Instructions (Signed)
Description   Continue  taking 1.5 tablets everyday.  Recheck INR in 2 weeks Start eating  dark green leafy veggies three times a  week consistently.  Call us with any concerns or medication changes # [phone] [facility], Main # [phone]. Phenobarb has been discontinued Keppra continues.

## 2017-12-30 ENCOUNTER — Ambulatory Visit (INDEPENDENT_AMBULATORY_CARE_PROVIDER_SITE_OTHER): Payer: Medicare Other | Admitting: *Deleted

## 2017-12-30 DIAGNOSIS — I495 Sick sinus syndrome: Secondary | ICD-10-CM | POA: Diagnosis not present

## 2017-12-30 NOTE — Progress Notes (Signed)
Remote pacemaker transmission.   

## 2017-12-31 ENCOUNTER — Encounter: Payer: Self-pay | Admitting: Cardiology

## 2018-01-04 ENCOUNTER — Other Ambulatory Visit: Payer: Self-pay

## 2018-01-04 ENCOUNTER — Encounter: Payer: Self-pay | Admitting: Physician Assistant

## 2018-01-04 MED ORDER — TORSEMIDE 20 MG PO TABS: 20.0000 mg | ORAL_TABLET | Freq: Every day | ORAL | 1 refills | Status: DC

## 2018-01-07 ENCOUNTER — Encounter: Payer: Self-pay | Admitting: Neurology

## 2018-01-07 ENCOUNTER — Telehealth: Payer: Self-pay | Admitting: Neurology

## 2018-01-07 ENCOUNTER — Ambulatory Visit (INDEPENDENT_AMBULATORY_CARE_PROVIDER_SITE_OTHER): Payer: Medicare Other | Admitting: *Deleted

## 2018-01-07 DIAGNOSIS — I482 Chronic atrial fibrillation: Secondary | ICD-10-CM

## 2018-01-07 DIAGNOSIS — I4891 Unspecified atrial fibrillation: Secondary | ICD-10-CM | POA: Diagnosis not present

## 2018-01-07 DIAGNOSIS — Z5181 Encounter for therapeutic drug level monitoring: Secondary | ICD-10-CM

## 2018-01-07 DIAGNOSIS — I4821 Permanent atrial fibrillation: Secondary | ICD-10-CM

## 2018-01-07 DIAGNOSIS — Z7901 Long term (current) use of anticoagulants: Secondary | ICD-10-CM | POA: Diagnosis not present

## 2018-01-07 LAB — POCT INR: INR: 4.1

## 2018-01-07 NOTE — Telephone Encounter (Signed)
Patient has been treated with Keppra since December 2018 to replace the phenobarbital as antiepileptic medications,  I am not sure the above-mentioned symptoms are related to his Keppra use,  Possibility also including aging, deconditioning, other medication side effect,

## 2018-01-07 NOTE — Telephone Encounter (Signed)
Pts wife requesting a call to discuss recent s/e as sent in the [place] message. [name] has gotten very concerned stating the pt is dizzy, constipated, and loss of appetite

## 2018-01-07 NOTE — Telephone Encounter (Signed)
Spoke to patient's wife - she is aware that Dr. Krista Blue received her email and does not feel his symptoms are related to [place].  He has been on Keppra since December 2018 and his symptoms have just started over the last month.  His last visit with his PCP was in January.  She is going to call to have him further evaluated by his PCP.

## 2018-01-07 NOTE — Patient Instructions (Signed)
Description   Skip today's dose, then start taking 1.5 tablets everyday except 1 tablet on Mondays and Fridays.   Recheck INR in 11 days.  Continue eating your dark green leafy veggies three times a  week consistently.  Call us with any concerns or medication changes # [phone] [facility], Main # [phone]. Phenobarb has been discontinued Keppra continues.

## 2018-01-11 DIAGNOSIS — I10 Essential (primary) hypertension: Secondary | ICD-10-CM | POA: Diagnosis not present

## 2018-01-11 DIAGNOSIS — R531 Weakness: Secondary | ICD-10-CM | POA: Diagnosis not present

## 2018-01-11 DIAGNOSIS — I5022 Chronic systolic (congestive) heart failure: Secondary | ICD-10-CM | POA: Diagnosis not present

## 2018-01-11 DIAGNOSIS — F0390 Unspecified dementia without behavioral disturbance: Secondary | ICD-10-CM | POA: Diagnosis not present

## 2018-01-11 DIAGNOSIS — I351 Nonrheumatic aortic (valve) insufficiency: Secondary | ICD-10-CM | POA: Diagnosis not present

## 2018-01-11 DIAGNOSIS — I4891 Unspecified atrial fibrillation: Secondary | ICD-10-CM | POA: Diagnosis not present

## 2018-01-11 DIAGNOSIS — D649 Anemia, unspecified: Secondary | ICD-10-CM | POA: Diagnosis not present

## 2018-01-11 DIAGNOSIS — G40909 Epilepsy, unspecified, not intractable, without status epilepticus: Secondary | ICD-10-CM | POA: Diagnosis not present

## 2018-01-11 DIAGNOSIS — Z950 Presence of cardiac pacemaker: Secondary | ICD-10-CM | POA: Diagnosis not present

## 2018-01-11 DIAGNOSIS — E0590 Thyrotoxicosis, unspecified without thyrotoxic crisis or storm: Secondary | ICD-10-CM | POA: Diagnosis not present

## 2018-01-11 DIAGNOSIS — N183 Chronic kidney disease, stage 3 (moderate): Secondary | ICD-10-CM | POA: Diagnosis not present

## 2018-01-11 DIAGNOSIS — I2510 Atherosclerotic heart disease of native coronary artery without angina pectoris: Secondary | ICD-10-CM | POA: Diagnosis not present

## 2018-01-14 DIAGNOSIS — I10 Essential (primary) hypertension: Secondary | ICD-10-CM | POA: Diagnosis not present

## 2018-01-14 DIAGNOSIS — N183 Chronic kidney disease, stage 3 (moderate): Secondary | ICD-10-CM | POA: Diagnosis not present

## 2018-01-14 DIAGNOSIS — E0590 Thyrotoxicosis, unspecified without thyrotoxic crisis or storm: Secondary | ICD-10-CM | POA: Diagnosis not present

## 2018-01-18 ENCOUNTER — Ambulatory Visit (INDEPENDENT_AMBULATORY_CARE_PROVIDER_SITE_OTHER): Payer: Medicare Other | Admitting: *Deleted

## 2018-01-18 DIAGNOSIS — Z7901 Long term (current) use of anticoagulants: Secondary | ICD-10-CM | POA: Diagnosis not present

## 2018-01-18 DIAGNOSIS — I4821 Permanent atrial fibrillation: Secondary | ICD-10-CM

## 2018-01-18 DIAGNOSIS — I4891 Unspecified atrial fibrillation: Secondary | ICD-10-CM | POA: Diagnosis not present

## 2018-01-18 DIAGNOSIS — Z5181 Encounter for therapeutic drug level monitoring: Secondary | ICD-10-CM | POA: Diagnosis not present

## 2018-01-18 DIAGNOSIS — I482 Chronic atrial fibrillation: Secondary | ICD-10-CM

## 2018-01-18 DIAGNOSIS — E871 Hypo-osmolality and hyponatremia: Secondary | ICD-10-CM | POA: Diagnosis not present

## 2018-01-18 LAB — POCT INR: INR: 4.4

## 2018-01-18 NOTE — Patient Instructions (Signed)
Description   Do not take coumadin today April 29th and no coumadin on April 30th then change dose of coumadin to  1 tablet everyday except 1 and 1/2  tablets on Sundays Tuesdays and Thursdays Recheck INR in 9 days.  Continue eating your dark green leafy veggies three times a  week consistently.  Call us with any concerns or medication changes # [phone] [facility], Main # [phone]. Phenobarb has been discontinued Keppra continues.

## 2018-01-27 ENCOUNTER — Ambulatory Visit (INDEPENDENT_AMBULATORY_CARE_PROVIDER_SITE_OTHER): Payer: Medicare Other | Admitting: Pharmacist

## 2018-01-27 DIAGNOSIS — I482 Chronic atrial fibrillation: Secondary | ICD-10-CM | POA: Diagnosis not present

## 2018-01-27 DIAGNOSIS — Z7901 Long term (current) use of anticoagulants: Secondary | ICD-10-CM | POA: Diagnosis not present

## 2018-01-27 DIAGNOSIS — I4891 Unspecified atrial fibrillation: Secondary | ICD-10-CM

## 2018-01-27 DIAGNOSIS — Z5181 Encounter for therapeutic drug level monitoring: Secondary | ICD-10-CM | POA: Diagnosis not present

## 2018-01-27 DIAGNOSIS — I4821 Permanent atrial fibrillation: Secondary | ICD-10-CM

## 2018-01-27 LAB — POCT INR: INR: 2.6

## 2018-01-27 NOTE — Patient Instructions (Signed)
Description   Continue taking 1 tablet every day except 1 and 1/2  tablets on Sundays, Tuesdays, and Thursdays. Recheck INR in 3 weeks. Call us with any concerns or medication changes # [phone] [facility], Main # [phone].

## 2018-02-02 ENCOUNTER — Encounter: Payer: Self-pay | Admitting: Skilled Nursing Facility1

## 2018-02-02 ENCOUNTER — Encounter: Payer: Medicare Other | Attending: Family Medicine | Admitting: Skilled Nursing Facility1

## 2018-02-02 DIAGNOSIS — Z681 Body mass index (BMI) 19 or less, adult: Secondary | ICD-10-CM | POA: Diagnosis not present

## 2018-02-02 DIAGNOSIS — N183 Chronic kidney disease, stage 3 unspecified: Secondary | ICD-10-CM

## 2018-02-02 DIAGNOSIS — Z713 Dietary counseling and surveillance: Secondary | ICD-10-CM | POA: Insufficient documentation

## 2018-02-02 NOTE — Progress Notes (Signed)
  Assessment:  Primary concerns today: feeding issues/under weight.  Pt arrived with his wife and daughter who spoke on the pts behalf due to his Dementia. Pts wife states he chews well for having dentures. Pts wife has been feeding the pt very healthy foods and worries about his sodium intake due to his kidney disease and heart disease. Pt states sometimes he has problems with dry foods and swallowing. Pts wife states he does not like anything cold or sweet. Pts wife states the pt may be starting to like sweet now. Pts wife states he needs variety every day ad will not eat the same foods too often.   MEDICATIONS: See List   DIETARY INTAKE:  Usual eating pattern includes 3 meals and 0 snacks per day.  Everyday foods include none stated.  Avoided foods include none stated.    24-hr recall:  B ( AM): oatmeal with flaxmeal with almonds butter with whole milk and prunes  Snk ( AM):  L ( PM): sandwich or vegetable and meat Snk ( PM):  D ( PM): sandwich or vegetable and meat Snk ( PM): Beverages:   Usual physical activity: ADL's  Estimated energy needs: 1800 calories 200 g carbohydrates 135 g protein 50 g fat  Progress Towards Goal(s):  In progress.   Nutritional Diagnosis:  [place]-3.2 Unintentional weight loss As related to dementia.  As evidenced by pts wife reporting, referral.    Intervention:  Nutrition counseling. Dietitian educated the pts wife and daughter on strategies to increase the pts caloric intake Goals: -Add cheese sauce, alfredo, gravies -Try rotisserie chicken wrapped in cheese for snack -Try fruit cup in heavy syrup -Keep boost/ensure/other supplement drinks in the house -Keep an assortment of snacks out for him to try -Keep frozen meals for him to microwave  -focus on canned vegetables as they are softer  -Add a lot of seasonings for palatability  -Offer him something to eat every hour -Stimulate him throughout the day: pack and unpack luggage  -Try car models  for him to tinker with  Teaching Method Utilized:  Visual Auditory Hands on  Handouts given during visit include:  Low sodium seasonings  Barriers to learning/adherence to lifestyle change: none identified   Demonstrated degree of understanding via:  Teach Back   Monitoring/Evaluation:  Dietary intake, exercise,and body weight prn.

## 2018-02-05 LAB — CUP PACEART REMOTE DEVICE CHECK
Battery Remaining Longevity: 84 mo
Implantable Lead Implant Date: 20130917
Implantable Lead Location: 753860
Implantable Lead Model: 4456
Implantable Lead Model: 4469
Implantable Lead Serial Number: 484382
Lead Channel Impedance Value: 408 Ohm
Lead Channel Impedance Value: 414 Ohm
Lead Channel Setting Pacing Amplitude: 2.4 V
Lead Channel Setting Pacing Pulse Width: 0.4 ms
MDC IDC LEAD IMPLANT DT: 20130917
MDC IDC LEAD LOCATION: 753859
MDC IDC LEAD SERIAL: 448722
MDC IDC MSMT BATTERY REMAINING PERCENTAGE: 90 %
MDC IDC PG IMPLANT DT: 20130917
MDC IDC SESS DTM: 20190410045200
MDC IDC SET LEADCHNL RV SENSING SENSITIVITY: 2.5 mV
MDC IDC STAT BRADY RA PERCENT PACED: 0 %
MDC IDC STAT BRADY RV PERCENT PACED: 92 %
Pulse Gen Serial Number: 111635

## 2018-02-08 DIAGNOSIS — N453 Epididymo-orchitis: Secondary | ICD-10-CM | POA: Diagnosis not present

## 2018-02-08 DIAGNOSIS — N3942 Incontinence without sensory awareness: Secondary | ICD-10-CM | POA: Diagnosis not present

## 2018-02-09 ENCOUNTER — Telehealth: Payer: Self-pay | Admitting: *Deleted

## 2018-02-09 NOTE — Telephone Encounter (Signed)
Wife called to inform us that the pt started Keflex twice a day for 14 days and started on 02/08/18; advised the med does not interact with Coumadin and she verbalizes understanding.

## 2018-02-17 ENCOUNTER — Ambulatory Visit (INDEPENDENT_AMBULATORY_CARE_PROVIDER_SITE_OTHER): Payer: Medicare Other | Admitting: *Deleted

## 2018-02-17 DIAGNOSIS — I482 Chronic atrial fibrillation: Secondary | ICD-10-CM | POA: Diagnosis not present

## 2018-02-17 DIAGNOSIS — Z7901 Long term (current) use of anticoagulants: Secondary | ICD-10-CM | POA: Diagnosis not present

## 2018-02-17 DIAGNOSIS — I4821 Permanent atrial fibrillation: Secondary | ICD-10-CM

## 2018-02-17 DIAGNOSIS — I4891 Unspecified atrial fibrillation: Secondary | ICD-10-CM

## 2018-02-17 DIAGNOSIS — Z5181 Encounter for therapeutic drug level monitoring: Secondary | ICD-10-CM

## 2018-02-17 LAB — POCT INR: INR: 8 — AB (ref 2.0–3.0)

## 2018-02-17 LAB — PROTIME-INR
INR: 6.4 (ref 0.8–1.2)
PROTHROMBIN TIME: 68 s — AB (ref 9.1–12.0)

## 2018-02-24 ENCOUNTER — Ambulatory Visit (INDEPENDENT_AMBULATORY_CARE_PROVIDER_SITE_OTHER): Payer: Medicare Other | Admitting: *Deleted

## 2018-02-24 DIAGNOSIS — I482 Chronic atrial fibrillation: Secondary | ICD-10-CM

## 2018-02-24 DIAGNOSIS — I4891 Unspecified atrial fibrillation: Secondary | ICD-10-CM | POA: Diagnosis not present

## 2018-02-24 DIAGNOSIS — Z7901 Long term (current) use of anticoagulants: Secondary | ICD-10-CM

## 2018-02-24 DIAGNOSIS — Z5181 Encounter for therapeutic drug level monitoring: Secondary | ICD-10-CM

## 2018-02-24 DIAGNOSIS — I4821 Permanent atrial fibrillation: Secondary | ICD-10-CM

## 2018-02-24 LAB — POCT INR: INR: 2.9 (ref 2.0–3.0)

## 2018-02-24 NOTE — Patient Instructions (Signed)
Description   Change your dose to 1 tablet daily.  Recheck INR in 12 days. Call us with any concerns or medication changes # [phone] [facility], Main # [phone].

## 2018-03-02 DIAGNOSIS — T83111D Breakdown (mechanical) of urinary sphincter implant, subsequent encounter: Secondary | ICD-10-CM | POA: Diagnosis not present

## 2018-03-02 DIAGNOSIS — N3942 Incontinence without sensory awareness: Secondary | ICD-10-CM | POA: Diagnosis not present

## 2018-03-03 ENCOUNTER — Other Ambulatory Visit: Payer: Self-pay | Admitting: Urology

## 2018-03-03 ENCOUNTER — Telehealth: Payer: Self-pay | Admitting: Interventional Cardiology

## 2018-03-03 NOTE — Telephone Encounter (Signed)
New Message:          [facility] Pre-operative Risk Assessment    Request for surgical clearance:  1. What type of surgery is being performed? Removal of a artifical urinary sphinter  2. When is this surgery scheduled? 03/24/18   3. What type of clearance is required (medical clearance vs. Pharmacy clearance to hold med vs. Both)? Pharmacy  4. Are there any medications that need to be held prior to surgery and how long? Warfarin  5. Practice name and name of physician performing surgery? Dr. Cornelia Copa Bell/[facility]   6. What is your office phone number [phone]   7.   What is your office fax number [phone]  8.   Anesthesia type (None, local, MAC, general) ? General   Maricela Bo Pulliam 03/03/2018, 3:03 PM  _________________________________________________________________   (provider comments below)

## 2018-03-04 NOTE — Telephone Encounter (Signed)
S/w pt's wife per [facility]) is aware to hold coumadin 5 days prior to procedure on 7/3.

## 2018-03-04 NOTE — Telephone Encounter (Signed)
Patient with diagnosis of afib on warfarin for anticoagulation.    Procedure: removal of artificial urinary sphincter Date of procedure: 03/24/18  CHADS2-VASc score of  5 (CHF, HTN, AGE, DM2, stroke/tia x 2, CAD, AGE, male)  Per office protocol, patient can hold warfarin for 5 days prior to procedure.   Patient will not need bridging with Lovenox (enoxaparin) around procedure.

## 2018-03-05 ENCOUNTER — Inpatient Hospital Stay (HOSPITAL_COMMUNITY)
Admission: AD | Admit: 2018-03-05 | Discharge: 2018-03-11 | DRG: 672 | Disposition: A | Discharge status: Home Health | Payer: Medicare Other | Source: Ambulatory Visit | Attending: Urology | Admitting: Urology

## 2018-03-05 ENCOUNTER — Other Ambulatory Visit: Payer: Self-pay

## 2018-03-05 ENCOUNTER — Encounter (HOSPITAL_COMMUNITY): Payer: Self-pay

## 2018-03-05 DIAGNOSIS — Z833 Family history of diabetes mellitus: Secondary | ICD-10-CM | POA: Diagnosis not present

## 2018-03-05 DIAGNOSIS — I4891 Unspecified atrial fibrillation: Secondary | ICD-10-CM | POA: Diagnosis present

## 2018-03-05 DIAGNOSIS — Z8249 Family history of ischemic heart disease and other diseases of the circulatory system: Secondary | ICD-10-CM

## 2018-03-05 DIAGNOSIS — I10 Essential (primary) hypertension: Secondary | ICD-10-CM | POA: Diagnosis present

## 2018-03-05 DIAGNOSIS — T83121A Displacement of urinary sphincter implant, initial encounter: Secondary | ICD-10-CM | POA: Diagnosis present

## 2018-03-05 DIAGNOSIS — D649 Anemia, unspecified: Secondary | ICD-10-CM | POA: Diagnosis present

## 2018-03-05 DIAGNOSIS — Z7901 Long term (current) use of anticoagulants: Secondary | ICD-10-CM | POA: Diagnosis not present

## 2018-03-05 DIAGNOSIS — Z950 Presence of cardiac pacemaker: Secondary | ICD-10-CM | POA: Diagnosis not present

## 2018-03-05 DIAGNOSIS — N50819 Testicular pain, unspecified: Secondary | ICD-10-CM | POA: Diagnosis not present

## 2018-03-05 DIAGNOSIS — I2510 Atherosclerotic heart disease of native coronary artery without angina pectoris: Secondary | ICD-10-CM | POA: Diagnosis present

## 2018-03-05 DIAGNOSIS — I130 Hypertensive heart and chronic kidney disease with heart failure and stage 1 through stage 4 chronic kidney disease, or unspecified chronic kidney disease: Secondary | ICD-10-CM | POA: Diagnosis not present

## 2018-03-05 DIAGNOSIS — N183 Chronic kidney disease, stage 3 (moderate): Secondary | ICD-10-CM | POA: Diagnosis not present

## 2018-03-05 DIAGNOSIS — I5043 Acute on chronic combined systolic (congestive) and diastolic (congestive) heart failure: Secondary | ICD-10-CM | POA: Diagnosis not present

## 2018-03-05 DIAGNOSIS — N3942 Incontinence without sensory awareness: Secondary | ICD-10-CM | POA: Diagnosis not present

## 2018-03-05 DIAGNOSIS — Y733 Surgical instruments, materials and gastroenterology and urology devices (including sutures) associated with adverse incidents: Secondary | ICD-10-CM | POA: Diagnosis present

## 2018-03-05 DIAGNOSIS — T83191A Other mechanical complication of urinary sphincter implant, initial encounter: Secondary | ICD-10-CM | POA: Diagnosis not present

## 2018-03-05 DIAGNOSIS — I080 Rheumatic disorders of both mitral and aortic valves: Secondary | ICD-10-CM | POA: Diagnosis present

## 2018-03-05 DIAGNOSIS — T83111A Breakdown (mechanical) of urinary sphincter implant, initial encounter: Secondary | ICD-10-CM

## 2018-03-05 LAB — CBC WITH DIFFERENTIAL/PLATELET
Basophils Absolute: 0 10*3/uL (ref 0.0–0.1)
Basophils Relative: 0 %
EOS PCT: 2 %
Eosinophils Absolute: 0.2 10*3/uL (ref 0.0–0.7)
HEMATOCRIT: 25.8 % — AB (ref 39.0–52.0)
HEMOGLOBIN: 8.4 g/dL — AB (ref 13.0–17.0)
LYMPHS ABS: 1.4 10*3/uL (ref 0.7–4.0)
LYMPHS PCT: 19 %
MCH: 30.7 pg (ref 26.0–34.0)
MCHC: 32.6 g/dL (ref 30.0–36.0)
MCV: 94.2 fL (ref 78.0–100.0)
Monocytes Absolute: 0.5 10*3/uL (ref 0.1–1.0)
Monocytes Relative: 7 %
NEUTROS ABS: 5.1 10*3/uL (ref 1.7–7.7)
Neutrophils Relative %: 72 %
PLATELETS: 169 10*3/uL (ref 150–400)
RBC: 2.74 MIL/uL — ABNORMAL LOW (ref 4.22–5.81)
RDW: 13.6 % (ref 11.5–15.5)
WBC: 7.1 10*3/uL (ref 4.0–10.5)

## 2018-03-05 LAB — BASIC METABOLIC PANEL
Anion gap: 9 (ref 5–15)
BUN: 59 mg/dL — AB (ref 6–20)
CHLORIDE: 101 mmol/L (ref 101–111)
CO2: 30 mmol/L (ref 22–32)
Calcium: 8.9 mg/dL (ref 8.9–10.3)
Creatinine, Ser: 1.69 mg/dL — ABNORMAL HIGH (ref 0.61–1.24)
GFR calc Af Amer: 40 mL/min — ABNORMAL LOW (ref >60)
GFR calc non Af Amer: 34 mL/min — ABNORMAL LOW (ref >60)
Glucose, Bld: 105 mg/dL — ABNORMAL HIGH (ref 65–99)
POTASSIUM: 3.9 mmol/L (ref 3.5–5.1)
SODIUM: 140 mmol/L (ref 135–145)

## 2018-03-05 LAB — PROTIME-INR
INR: 3.31
Prothrombin Time: 33.4 seconds — ABNORMAL HIGH (ref 11.4–15.2)

## 2018-03-05 MED ORDER — SERTRALINE HCL 100 MG PO TABS
100.0000 mg | ORAL_TABLET | Freq: Every day | ORAL | Status: DC
  Administered 2018-03-06 – 2018-03-11 (×4): 100 mg via ORAL
  Filled 2018-03-05 (×5): qty 1

## 2018-03-05 MED ORDER — ORAL CARE MOUTH RINSE
15.0000 mL | Freq: Two times a day (BID) | OROMUCOSAL | Status: DC
  Administered 2018-03-05 – 2018-03-10 (×6): 15 mL via OROMUCOSAL

## 2018-03-05 MED ORDER — CEFAZOLIN SODIUM-DEXTROSE 1-4 GM/50ML-% IV SOLN
1.0000 g | Freq: Three times a day (TID) | INTRAVENOUS | Status: DC
  Administered 2018-03-05: 1 g via INTRAVENOUS
  Filled 2018-03-05 (×2): qty 50

## 2018-03-05 MED ORDER — CHLORHEXIDINE GLUCONATE 0.12 % MT SOLN
15.0000 mL | Freq: Two times a day (BID) | OROMUCOSAL | Status: DC
  Administered 2018-03-05 – 2018-03-11 (×10): 15 mL via OROMUCOSAL
  Filled 2018-03-05 (×11): qty 15

## 2018-03-05 MED ORDER — LISINOPRIL 5 MG PO TABS
2.5000 mg | ORAL_TABLET | Freq: Every day | ORAL | Status: DC
  Administered 2018-03-08 – 2018-03-11 (×3): 2.5 mg via ORAL
  Filled 2018-03-05 (×5): qty 1

## 2018-03-05 MED ORDER — CEFAZOLIN SODIUM-DEXTROSE 1-4 GM/50ML-% IV SOLN
1.0000 g | Freq: Two times a day (BID) | INTRAVENOUS | Status: DC
  Administered 2018-03-06: 1 g via INTRAVENOUS
  Filled 2018-03-05: qty 50

## 2018-03-05 MED ORDER — DOCUSATE SODIUM 100 MG PO CAPS
100.0000 mg | ORAL_CAPSULE | Freq: Two times a day (BID) | ORAL | Status: DC
  Administered 2018-03-05 – 2018-03-11 (×11): 100 mg via ORAL
  Filled 2018-03-05 (×11): qty 1

## 2018-03-05 MED ORDER — ONDANSETRON HCL 4 MG/2ML IJ SOLN: 4.0000 mg | INTRAMUSCULAR | Status: DC | PRN

## 2018-03-05 MED ORDER — MORPHINE SULFATE (PF) 2 MG/ML IV SOLN: 2.0000 mg | INTRAVENOUS | Status: DC | PRN

## 2018-03-05 MED ORDER — DIPHENHYDRAMINE HCL 50 MG/ML IJ SOLN: 12.5000 mg | Freq: Four times a day (QID) | INTRAMUSCULAR | Status: DC | PRN

## 2018-03-05 MED ORDER — LEVETIRACETAM 500 MG PO TABS
500.0000 mg | ORAL_TABLET | Freq: Two times a day (BID) | ORAL | Status: DC
  Administered 2018-03-05 – 2018-03-11 (×10): 500 mg via ORAL
  Filled 2018-03-05 (×11): qty 1

## 2018-03-05 MED ORDER — VITAMIN B-12 1000 MCG PO TABS
1000.0000 ug | ORAL_TABLET | Freq: Every day | ORAL | Status: DC
  Administered 2018-03-06 – 2018-03-11 (×4): 1000 ug via ORAL
  Filled 2018-03-05 (×6): qty 1

## 2018-03-05 MED ORDER — ACETAMINOPHEN 325 MG PO TABS: 650.0000 mg | ORAL_TABLET | ORAL | Status: DC | PRN

## 2018-03-05 MED ORDER — DIPHENHYDRAMINE HCL 12.5 MG/5ML PO ELIX: 12.5000 mg | ORAL_SOLUTION | Freq: Four times a day (QID) | ORAL | Status: DC | PRN

## 2018-03-05 MED ORDER — TORSEMIDE 20 MG PO TABS
20.0000 mg | ORAL_TABLET | Freq: Every day | ORAL | Status: DC
  Administered 2018-03-06 – 2018-03-11 (×4): 20 mg via ORAL
  Filled 2018-03-05 (×6): qty 1

## 2018-03-05 MED ORDER — PHYTONADIONE 5 MG PO TABS
5.0000 mg | ORAL_TABLET | Freq: Once | ORAL | Status: AC
Start: 2018-03-05 — End: 2018-03-05
  Administered 2018-03-05: 5 mg via ORAL
  Filled 2018-03-05: qty 1

## 2018-03-05 MED ORDER — SENNA 8.6 MG PO TABS
1.0000 | ORAL_TABLET | Freq: Two times a day (BID) | ORAL | Status: DC
  Administered 2018-03-05 – 2018-03-11 (×10): 8.6 mg via ORAL
  Filled 2018-03-05 (×11): qty 1

## 2018-03-05 MED ORDER — OXYCODONE HCL 5 MG PO TABS: 5.0000 mg | ORAL_TABLET | ORAL | Status: DC | PRN

## 2018-03-05 NOTE — H&P (Signed)
CC/HPI: CC: Testicular pain  HPI: 84 year old male who had a artificial sphincter placed in 2005. It has not worked for quite some time. He is now completely incontinent and wears pads. The has some vague complaints about testicular pain/groin pain. He states that this has been going on for quite some time but has recently worsened. He denies any fevers, other pain, hematuria. Still emptying his bladder well. He is on warfarin for atrial fibrillation.   03/02/18  Patient returns after taking Keflex for 2 weeks. He continues to have pain mostly in the scrotum but also in his right upper quadrant. Examination is unchanged.   03/05/18  The patient's wife examine his scrotum this morning and noted some tubing protruded from his scrotum. He therefore presents as an urgent work in. He denies any fever or change in pain. He denies any significant drainage.   ALLERGIES: No Allergies   MEDICATIONS: Daily Vitamin tablet Oral  Fish Oil 120 mg-180 mg capsule Oral  Jantoven 5 mg tablet  Keppra 500 mg tablet  Sertraline HCl - 100 MG Oral Tablet Oral  Torsemide 20 mg tablet  Vitamin B-12 2,000 mcg tablet, extended release Oral  Vitamin D TABS Oral    Notes: Dr. Irish Lack (cardio)   GU PSH: No GU PSH      PSH Notes: Implantation Of Diaphragmatic Pacemaker, Inguinal Hernia Repair, Prostate Surgery   NON-GU PSH: Hernia Repair   GU PMH: Epididymo-orchitis - 02/08/2018 Enuresis, Enuresis not due to substance or known physiological condition - 2014 Incontinence w/o Sensation, Urinary incontinence without sensory awareness - 2014 Mixed incontinence, Urge and stress incontinence - 2014 Nocturia, Nocturia - 2014 Prostate Cancer, Prostate cancer - 2014 Stress Incontinence, Male stress incontinence - 2014 Urinary Retention, Unspec, Incomplete bladder emptying - 2014     PMH Notes:  1898-09-22 00:00:00 - Note: Normal Routine History And Physical Senior Citizen [phone]  2006-10-02 10:12:03 - Note: Acute  Myocardial Infarction  2006-10-02 10:12:03 - Note: Cardiac Catheterization (Diagnostic)   NON-GU PMH: Breakdown (mechanical) of urinary sphincter implant, subsequent encounter - 03/02/2018 Personal history of nicotine dependence, Former Smoker - 2014 Personal history of other diseases of the circulatory system, History of hypertension - 2014 Anxiety Arrhythmia Heart disease, unspecified Liver Disease   FAMILY HISTORY: 1 Daughter - Other 1 son - Other Coronary Artery Disease - Runs In Family Diabetes - Runs In Family Family Health Status Number - Runs In Family Hypertension - Runs In Family Urologic Disorder - Runs In Family   SOCIAL HISTORY: Marital Status: Married Preferred Language: English; Race: White Current Smoking Status: Patient has never smoked.   Tobacco Use Assessment Completed:  Used Tobacco in last 30 days?  Does not drink caffeine.    Notes: Death In The Family Father, Death In The Family Mother   REVIEW OF SYSTEMS:    GU Review Male:   Patient denies frequent urination, hard to postpone urination, burning/ pain with urination, get up at night to urinate, leakage of urine, stream starts and stops, trouble starting your stream, have to strain to urinate , erection problems, and penile pain.  Gastrointestinal (Upper):   Patient denies nausea, vomiting, and indigestion/ heartburn.  Gastrointestinal (Lower):   Patient denies diarrhea and constipation.  Constitutional:   Patient denies fever, night sweats, weight loss, and fatigue.  Skin:   Patient denies skin rash/ lesion and itching.  Eyes:   Patient denies blurred vision and double vision.  Ears/ Nose/ Throat:   Patient denies sore throat and sinus  problems.  Hematologic/Lymphatic:   Patient denies swollen glands and easy bruising.  Cardiovascular:   Patient denies chest pains and leg swelling.  Respiratory:   Patient denies cough and shortness of breath.  Endocrine:   Patient denies excessive thirst.   Musculoskeletal:   Patient denies back pain and joint pain.  Neurological:   Patient denies headaches and dizziness.  Psychologic:   Patient denies depression and anxiety.   VITAL SIGNS:      03/05/2018 11:26 AM  BP 108/47 mmHg  Heart Rate 64 /min  Temperature 96.7 F / 35.9 C   GU PHYSICAL EXAMINATION:    Testes: The entire pump of the AUS is protruding from the scrotum. There is some overlying erythema but no gross fluctuance. Testicles are descended bilaterally without any tenderness. There is no drainage from the scrotal opening.    MULTI-SYSTEM PHYSICAL EXAMINATION:    Constitutional: Elderly male in no acute distress  Neck: Neck symmetrical, not swollen. Normal tracheal position.  Respiratory: No labored breathing, no use of accessory muscles.   Cardiovascular: Normal temperature, adequate perfusion of extremities  Skin: No paleness, no jaundice  Neurologic / Psychiatric: Oriented to time, oriented to place, oriented to person. No depression, no anxiety, no agitation.  Gastrointestinal: No mass, no tenderness, no rigidity, non obese abdomen.  Eyes: Normal conjunctivae. Normal eyelids.  Musculoskeletal: Normal gait and station of head and neck.    PAST DATA REVIEWED:  Source Of History:  Patient   01/04/09 10/08/07 10/02/06 10/02/05 03/13/05 08/23/04 02/26/04 08/25/03  PSA  Total PSA 0.40  0.23  0.16  0.07  0.04  0.02  0.02  0.00    PROCEDURES:         Urinalysis w/Scope Dipstick Dipstick Cont'd Micro  Color: Yellow Bilirubin: Neg WBC/hpf: 6 - 10/hpf  Appearance: Clear Ketones: Neg RBC/hpf: NS (Not Seen)  Specific Gravity: 1.015 Blood: Neg Bacteria: Few (10-25/hpf)  pH: 6.0 Protein: Neg Cystals: NS (Not Seen)  Glucose: Neg Urobilinogen: 0.2 Casts: Hyaline    Nitrites: Neg Trichomonas: Not Present    Leukocyte Esterase: 1+ Mucous: Not Present      Epithelial Cells: 0 - 5/hpf      Yeast: NS (Not Seen)      Sperm: Not Present   ASSESSMENT:      ICD-10 Details  1  NON-GU:   Breakdown (mechanical) of urinary sphincter implant, subsequent encounter - T83.111D    PLAN:          Document Letter(s):  Created for Patient: Clinical Summary        Notes:   Admit to the hospital. Start IV antibiotics. Stop Coumadin. I will obtain labs and determine his INR today. Once his INR is within an acceptable level, can proceed with AUS excision.        Next Appointment:      Next Appointment: 03/24/2018 10:45 AM    Appointment Type: Surgery     Location: [facility] [phone]    Provider: Link Snuffer, III, M.D.    Reason for Visit: OBS WL REMOVAL OF AUS     Signed by Link Snuffer, III, M.D. on 03/05/18 at 3:31 PM (EDT

## 2018-03-05 NOTE — Progress Notes (Signed)
PHARMACY NOTE:  ANTIMICROBIAL RENAL DOSAGE ADJUSTMENT  Current antimicrobial regimen includes a mismatch between antimicrobial dosage and estimated renal function. As per policy approved by the Pharmacy & Therapeutics and Medical Executive Committees, the antimicrobial dosage will be adjusted accordingly.  Current antimicrobial and dosage:  Ancef 1g mg q8 hr  Indication: wound infection  Renal Function:   Estimated Creatinine Clearance: 18.2 mL/min (A) (by C-G formula based on SCr of 1.69 mg/dL (H)). []      On intermittent HD, scheduled: []      On CRRT    Antimicrobial dosage has been changed to:  1g IV q12 hr   Additional Comments: n/a   Thank you for allowing pharmacy to be a part of this patient's care.  Reuel Boom, PharmD, BCPS [phone] 03/05/2018, 5:21 PM

## 2018-03-06 LAB — ABO/RH: ABO/RH(D): O POS

## 2018-03-06 LAB — TYPE AND SCREEN
ABO/RH(D): O POS
Antibody Screen: NEGATIVE

## 2018-03-06 LAB — PROTIME-INR
INR: 2.44
INR: 1.75
Prothrombin Time: 26.3 seconds — ABNORMAL HIGH (ref 11.4–15.2)
Prothrombin Time: 20.3 seconds — ABNORMAL HIGH (ref 11.4–15.2)

## 2018-03-06 MED ORDER — SODIUM CHLORIDE 0.9 % IV SOLN
INTRAVENOUS | Status: DC
  Administered 2018-03-06: 22:00:00 via INTRAVENOUS

## 2018-03-06 MED ORDER — PHYTONADIONE 5 MG PO TABS
2.5000 mg | ORAL_TABLET | Freq: Once | ORAL | Status: AC
  Administered 2018-03-06: 2.5 mg via ORAL
  Filled 2018-03-06: qty 1

## 2018-03-06 MED ORDER — VANCOMYCIN HCL IN DEXTROSE 1-5 GM/200ML-% IV SOLN
1000.0000 mg | Freq: Once | INTRAVENOUS | Status: AC
  Administered 2018-03-06: 1000 mg via INTRAVENOUS
  Filled 2018-03-06: qty 200

## 2018-03-06 MED ORDER — SODIUM CHLORIDE 0.9 % IV SOLN
1.0000 g | INTRAVENOUS | Status: DC
  Administered 2018-03-06 – 2018-03-10 (×4): 1 g via INTRAVENOUS
  Filled 2018-03-06 (×6): qty 1

## 2018-03-06 MED ORDER — VANCOMYCIN HCL 500 MG IV SOLR
500.0000 mg | INTRAVENOUS | Status: DC
  Administered 2018-03-08 – 2018-03-10 (×2): 500 mg via INTRAVENOUS
  Filled 2018-03-06 (×2): qty 500

## 2018-03-06 NOTE — Progress Notes (Signed)
Patient ID: Eddie Park, male   DOB: Jan 25, 1927, 84 y.o.   MRN: [id number]    Subjective: Pt doing well.  No complaints today.  Objective: Vital signs in last 24 hours: Temp:  [97.4 F (36.3 C)-98.1 F (36.7 C)] 98.1 F (36.7 C) (06/15 0544) Pulse Rate:  [59-60] 60 (06/15 0544) Resp:  [14-16] 16 (06/15 0544) BP: (118)/(43-72) 118/72 (06/15 0544) SpO2:  [98 %-100 %] 98 % (06/15 0544) Weight:  [43.4 kg (95 lb 10.9 oz)] 43.4 kg (95 lb 10.9 oz) (06/14 1434)  Intake/Output from previous day: 06/14 0701 - 06/15 0700 In: 70 [P.O.:20; IV Piggyback:50] Out: 500 [Urine:500] Intake/Output this shift: No intake/output data recorded.  Physical Exam:  General: Alert and oriented GU: AUS pump is completely extruded out of lateral scrotum but no erythema, purulent drainage, or tenderness.  No erythema or tenderness over pump or cuff sites.  Lab Results: Recent Labs    03/05/18 1450  HGB 8.4*  HCT 25.8*   BMET Recent Labs    03/05/18 1450  NA 140  K 3.9  CL 101  CO2 30  GLUCOSE 105*  BUN 59*  CREATININE 1.69*  CALCIUM 8.9   INR 2.5  Studies/Results: No results found.  Assessment/Plan: Eroded AUS: Will continue to administer Vitamin K and recheck INR in AM with plans to proceed with removal of AUS if INR is below 1.5.   Will broaden antibiotic coverage to prevent clinical infection in the meantime with Vancomycin and Cefepime.     LOS: 1 day   Olen Eaves,LES 03/06/2018, 8:28 AM

## 2018-03-06 NOTE — Progress Notes (Addendum)
Pharmacy Antibiotic Note  Eddie Park is a 84 y.o. male admitted on 03/05/2018 with eroded artificial sphincter.  Pharmacy has been consulted for vancomycin and cefepime dosing.  Plan: Cefepime 1 gm IV q24 Vancomycin 1 gm IV x 1 loading dose followed by vancomycin 500 mg IV q48 for AUC 455 using SCr 1.69 9 (TBW 2nd TBW > IBW) F/u renal function, WBC, temp, culture data Vancomycin levels as needed  Height: 5\' 6"  (167.6 cm) Weight: 95 lb 10.9 oz (43.4 kg) IBW/kg (Calculated) : 63.8  Temp (24hrs), Avg:97.8 F (36.6 C), Min:97.4 F (36.3 C), Max:98.1 F (36.7 C)  Recent Labs  Lab 03/05/18 1450  WBC 7.1  CREATININE 1.69*    Estimated Creatinine Clearance: 18.2 mL/min (A) (by C-G formula based on SCr of 1.69 mg/dL (H)).    No Known Allergies  Antimicrobials this admission: 6/14 cefazolin>> 6/15 6/15 vanc >> 6/15 cefepime >> Dose adjustments this admission: 6/14 cefazolin 1 gm q8 >>q12  Microbiology results: None  Thank you for allowing pharmacy to be a part of this patient's care.  Eudelia Bunch, Pharm.D. [phone] 03/06/2018 8:56 AM

## 2018-03-06 NOTE — Progress Notes (Signed)
Patient had uneventful day, urinated several times as well as a bowel movement. Handed off patient to nurse with wife in room, SCD's on, comfortable and AOx4. LM

## 2018-03-07 ENCOUNTER — Inpatient Hospital Stay (HOSPITAL_COMMUNITY): Payer: Medicare Other | Admitting: Registered Nurse

## 2018-03-07 ENCOUNTER — Encounter (HOSPITAL_COMMUNITY): Payer: Self-pay | Admitting: *Deleted

## 2018-03-07 ENCOUNTER — Encounter (HOSPITAL_COMMUNITY)
Admission: AD | Disposition: A | Discharge status: Home Health | Payer: Self-pay | Source: Ambulatory Visit | Attending: Urology

## 2018-03-07 DIAGNOSIS — I130 Hypertensive heart and chronic kidney disease with heart failure and stage 1 through stage 4 chronic kidney disease, or unspecified chronic kidney disease: Secondary | ICD-10-CM | POA: Diagnosis not present

## 2018-03-07 DIAGNOSIS — N183 Chronic kidney disease, stage 3 (moderate): Secondary | ICD-10-CM | POA: Diagnosis not present

## 2018-03-07 DIAGNOSIS — I5043 Acute on chronic combined systolic (congestive) and diastolic (congestive) heart failure: Secondary | ICD-10-CM | POA: Diagnosis not present

## 2018-03-07 DIAGNOSIS — T83191A Other mechanical complication of urinary sphincter implant, initial encounter: Secondary | ICD-10-CM | POA: Diagnosis not present

## 2018-03-07 HISTORY — PX: CYSTOSCOPY: SHX5120

## 2018-03-07 HISTORY — PX: URINARY SPHINCTER IMPLANT: SHX2624

## 2018-03-07 LAB — BASIC METABOLIC PANEL
Anion gap: 8 (ref 5–15)
BUN: 47 mg/dL — AB (ref 6–20)
CALCIUM: 8.4 mg/dL — AB (ref 8.9–10.3)
CO2: 30 mmol/L (ref 22–32)
CREATININE: 1.44 mg/dL — AB (ref 0.61–1.24)
Chloride: 102 mmol/L (ref 101–111)
GFR calc non Af Amer: 42 mL/min — ABNORMAL LOW (ref >60)
GFR, EST AFRICAN AMERICAN: 48 mL/min — AB (ref >60)
Glucose, Bld: 89 mg/dL (ref 65–99)
Potassium: 3.4 mmol/L — ABNORMAL LOW (ref 3.5–5.1)
SODIUM: 140 mmol/L (ref 135–145)

## 2018-03-07 LAB — SURGICAL PCR SCREEN
MRSA, PCR: NEGATIVE
STAPHYLOCOCCUS AUREUS: NEGATIVE

## 2018-03-07 LAB — CBC
HEMATOCRIT: 23.6 % — AB (ref 39.0–52.0)
Hemoglobin: 7.7 g/dL — ABNORMAL LOW (ref 13.0–17.0)
MCH: 30.7 pg (ref 26.0–34.0)
MCHC: 32.6 g/dL (ref 30.0–36.0)
MCV: 94 fL (ref 78.0–100.0)
Platelets: 171 10*3/uL (ref 150–400)
RBC: 2.51 MIL/uL — ABNORMAL LOW (ref 4.22–5.81)
RDW: 13.7 % (ref 11.5–15.5)
WBC: 5.7 10*3/uL (ref 4.0–10.5)

## 2018-03-07 LAB — PROTIME-INR
INR: 1.71
INR: 1.65
PROTHROMBIN TIME: 19.9 s — AB (ref 11.4–15.2)
Prothrombin Time: 19.3 seconds — ABNORMAL HIGH (ref 11.4–15.2)

## 2018-03-07 SURGERY — INSERTION, ARTIFICIAL URINARY SPHINCTER
Anesthesia: General | Site: Urethra

## 2018-03-07 MED ORDER — ONDANSETRON HCL 4 MG/2ML IJ SOLN
INTRAMUSCULAR | Status: AC
  Filled 2018-03-07: qty 2

## 2018-03-07 MED ORDER — FENTANYL CITRATE (PF) 100 MCG/2ML IJ SOLN
INTRAMUSCULAR | Status: AC
  Filled 2018-03-07: qty 2

## 2018-03-07 MED ORDER — SODIUM CHLORIDE 0.9 % IV SOLN: Freq: Once | INTRAVENOUS | Status: DC

## 2018-03-07 MED ORDER — SODIUM CHLORIDE 0.9 % IV SOLN
INTRAVENOUS | Status: AC
  Filled 2018-03-07: qty 500000

## 2018-03-07 MED ORDER — FENTANYL CITRATE (PF) 100 MCG/2ML IJ SOLN
INTRAMUSCULAR | Status: DC | PRN
  Administered 2018-03-07 (×2): 25 ug via INTRAVENOUS

## 2018-03-07 MED ORDER — SODIUM CHLORIDE 0.9 % IR SOLN
Status: DC | PRN
  Administered 2018-03-07: 3000 mL

## 2018-03-07 MED ORDER — BUPIVACAINE HCL (PF) 0.25 % IJ SOLN
INTRAMUSCULAR | Status: AC
  Filled 2018-03-07: qty 30

## 2018-03-07 MED ORDER — ONDANSETRON HCL 4 MG/2ML IJ SOLN
INTRAMUSCULAR | Status: DC | PRN
  Administered 2018-03-07: 4 mg via INTRAVENOUS

## 2018-03-07 MED ORDER — PROPOFOL 10 MG/ML IV BOLUS
INTRAVENOUS | Status: AC
  Filled 2018-03-07: qty 20

## 2018-03-07 MED ORDER — LIDOCAINE HCL (CARDIAC) PF 50 MG/5ML IV SOSY
PREFILLED_SYRINGE | INTRAVENOUS | Status: DC | PRN
  Administered 2018-03-07: 80 mg via INTRAVENOUS

## 2018-03-07 MED ORDER — FENTANYL CITRATE (PF) 100 MCG/2ML IJ SOLN: 25.0000 ug | INTRAMUSCULAR | Status: DC | PRN

## 2018-03-07 MED ORDER — PROPOFOL 10 MG/ML IV BOLUS
INTRAVENOUS | Status: DC | PRN
  Administered 2018-03-07: 20 mg via INTRAVENOUS

## 2018-03-07 MED ORDER — PROMETHAZINE HCL 25 MG/ML IJ SOLN: 6.2500 mg | INTRAMUSCULAR | Status: DC | PRN

## 2018-03-07 MED ORDER — DEXAMETHASONE SODIUM PHOSPHATE 10 MG/ML IJ SOLN
INTRAMUSCULAR | Status: AC
  Filled 2018-03-07: qty 1

## 2018-03-07 MED ORDER — LACTATED RINGERS IV SOLN
INTRAVENOUS | Status: DC | PRN
  Administered 2018-03-07: 09:00:00 via INTRAVENOUS

## 2018-03-07 MED ORDER — ETOMIDATE 2 MG/ML IV SOLN
INTRAVENOUS | Status: DC | PRN
  Administered 2018-03-07: 8 mg via INTRAVENOUS

## 2018-03-07 MED ORDER — LACTATED RINGERS IV SOLN
INTRAVENOUS | Status: DC
  Administered 2018-03-07: 09:00:00 via INTRAVENOUS

## 2018-03-07 MED ORDER — SODIUM CHLORIDE 0.9 % IV SOLN
INTRAVENOUS | Status: DC | PRN
  Administered 2018-03-07: 500 mL

## 2018-03-07 SURGICAL SUPPLY — 52 items
ADH SKN CLS APL DERMABOND .7 (GAUZE/BANDAGES/DRESSINGS) ×4
BAG DECANTER FOR FLEXI CONT (MISCELLANEOUS) ×4 IMPLANT
BAG URINE DRAINAGE (UROLOGICAL SUPPLIES) ×4 IMPLANT
BLADE SURG 15 STRL LF DISP TIS (BLADE) ×4 IMPLANT
BLADE SURG 15 STRL SS (BLADE) ×8
BNDG GAUZE ELAST 4 BULKY (GAUZE/BANDAGES/DRESSINGS) ×4 IMPLANT
BRIEF STRETCH FOR OB PAD LRG (UNDERPADS AND DIAPERS) ×4 IMPLANT
CATH FOLEY 2W COUNCIL 5CC 16FR (CATHETERS) ×2 IMPLANT
CATH FOLEY 2WAY SLVR  5CC 14FR (CATHETERS) ×2
CATH FOLEY 2WAY SLVR 5CC 14FR (CATHETERS) ×2 IMPLANT
COVER MAYO STAND STRL (DRAPES) ×4 IMPLANT
DECANTER SPIKE VIAL GLASS SM (MISCELLANEOUS) ×12 IMPLANT
DERMABOND ADVANCED (GAUZE/BANDAGES/DRESSINGS) ×4
DERMABOND ADVANCED .7 DNX12 (GAUZE/BANDAGES/DRESSINGS) ×4 IMPLANT
DISSECTOR ROUND CHERRY 3/8 STR (MISCELLANEOUS) ×4 IMPLANT
DRAPE SHEET LG 3/4 BI-LAMINATE (DRAPES) IMPLANT
DRSG TELFA 3X8 NADH (GAUZE/BANDAGES/DRESSINGS) ×4 IMPLANT
ELECT PENCIL ROCKER SW 15FT (MISCELLANEOUS) ×4 IMPLANT
ELECT REM PT RETURN 15FT ADLT (MISCELLANEOUS) ×4 IMPLANT
GAUZE 4X4 16PLY RFD (DISPOSABLE) ×8 IMPLANT
GLOVE BIO SURGEON STRL SZ 6.5 (GLOVE) ×3 IMPLANT
GLOVE BIO SURGEONS STRL SZ 6.5 (GLOVE) ×1
GLOVE BIOGEL M STRL SZ7.5 (GLOVE) ×4 IMPLANT
GOWN STRL REUS W/TWL XL LVL3 (GOWN DISPOSABLE) ×4 IMPLANT
KIT BASIN OR (CUSTOM PROCEDURE TRAY) ×4 IMPLANT
LOOP VESSEL MAXI BLUE (MISCELLANEOUS) ×4 IMPLANT
PACK CYSTO (CUSTOM PROCEDURE TRAY) ×4 IMPLANT
PAD DRESSING TELFA 3X8 NADH (GAUZE/BANDAGES/DRESSINGS) ×2 IMPLANT
PLUG CATH AND CAP STER (CATHETERS) ×4 IMPLANT
POSITIONER SURGICAL ARM (MISCELLANEOUS) ×4 IMPLANT
SHEET LAVH (DRAPES) ×4 IMPLANT
SUT CHROMIC 3 0 SH 27 (SUTURE) ×2 IMPLANT
SUT MNCRL AB 4-0 PS2 18 (SUTURE) ×8 IMPLANT
SUT SILK 0 FSL (SUTURE) ×4 IMPLANT
SUT SILK 0 SH 30 (SUTURE) ×4 IMPLANT
SUT VIC AB 0 CT1 27 (SUTURE) ×4
SUT VIC AB 0 CT1 27XBRD ANTBC (SUTURE) ×2 IMPLANT
SUT VIC AB 3-0 SH 27 (SUTURE) ×20
SUT VIC AB 3-0 SH 27X BRD (SUTURE) ×8 IMPLANT
SUT VIC AB 3-0 SH 27XBRD (SUTURE) IMPLANT
SUT VIC AB 4-0 SH 27 (SUTURE) ×12
SUT VIC AB 4-0 SH 27XBRD (SUTURE) IMPLANT
SWAB COLLECTION DEVICE MRSA (MISCELLANEOUS) ×4 IMPLANT
SWAB CULTURE ESWAB REG 1ML (MISCELLANEOUS) ×4 IMPLANT
SYR 10ML LL (SYRINGE) ×8 IMPLANT
SYR 20CC LL (SYRINGE) ×4 IMPLANT
SYR 30ML LL (SYRINGE) ×4 IMPLANT
TOWEL OR 17X26 10 PK STRL BLUE (TOWEL DISPOSABLE) ×8 IMPLANT
TOWEL OR NON WOVEN STRL DISP B (DISPOSABLE) ×4 IMPLANT
TUBING CONNECTING 10 (TUBING) ×3 IMPLANT
TUBING CONNECTING 10' (TUBING) ×1
YANKAUER SUCT BULB TIP 10FT TU (MISCELLANEOUS) ×4 IMPLANT

## 2018-03-07 NOTE — Anesthesia Postprocedure Evaluation (Signed)
Anesthesia Post Note  Patient: Eddie Park  Procedure(s) Performed: ARTIFICIAL URINARY SPHINCTER REMOVAL (N/A Abdomen) CYSTOSCOPY FLEXIBLE (N/A Urethra)     Patient location during evaluation: PACU Anesthesia Type: General Level of consciousness: awake and alert Pain management: pain level controlled Vital Signs Assessment: post-procedure vital signs reviewed and stable Respiratory status: spontaneous breathing, nonlabored ventilation, respiratory function stable and patient connected to nasal cannula oxygen Cardiovascular status: blood pressure returned to baseline and stable Postop Assessment: no apparent nausea or vomiting Anesthetic complications: no    Last Vitals:  Vitals:   03/07/18 1130 03/07/18 1145  BP: (!) 136/56   Pulse:  66  Resp:    Temp:    SpO2:      Last Pain:  Vitals:   03/07/18 1145  TempSrc:   PainSc: Asleep                 [name]

## 2018-03-07 NOTE — Anesthesia Preprocedure Evaluation (Signed)
Anesthesia Evaluation  Patient identified by MRN, date of birth, ID band Patient awake    Reviewed: Allergy & Precautions, NPO status , Patient's Chart, lab work & pertinent test results  Airway Mallampati: II  TM Distance: >3 FB Neck ROM: Limited    Dental no notable dental hx.    Pulmonary neg pulmonary ROS,    Pulmonary exam normal breath sounds clear to auscultation       Cardiovascular hypertension, + CAD  + dysrhythmias Atrial Fibrillation + pacemaker + Valvular Problems/Murmurs AI and MR  Rhythm:Regular Rate:Normal + Systolic murmurs    Neuro/Psych negative neurological ROS  negative psych ROS   GI/Hepatic negative GI ROS, Neg liver ROS,   Endo/Other  negative endocrine ROS  Renal/GU negative Renal ROS  negative genitourinary   Musculoskeletal negative musculoskeletal ROS (+)   Abdominal   Peds negative pediatric ROS (+)  Hematology  (+) anemia ,   Anesthesia Other Findings   Reproductive/Obstetrics negative OB ROS                             Anesthesia Physical Anesthesia Plan  ASA: IV and emergent  Anesthesia Plan: General   Post-op Pain Management:    Induction: Intravenous  PONV Risk Score and Plan: 2 and Ondansetron and Dexamethasone  Airway Management Planned: LMA and Oral ETT  Additional Equipment:   Intra-op Plan:   Post-operative Plan: Extubation in OR  Informed Consent: I have reviewed the patients History and Physical, chart, labs and discussed the procedure including the risks, benefits and alternatives for the proposed anesthesia with the patient or authorized representative who has indicated his/her understanding and acceptance.   Dental advisory given  Plan Discussed with: CRNA and Surgeon  Anesthesia Plan Comments:         Anesthesia Quick Evaluation

## 2018-03-07 NOTE — Op Note (Signed)
Preoperative diagnosis: Eroded artificial urinary sphincter  Postoperative diagnosis: Eroded artificial urinary sphincter  Procedure: Removal of artificial urinary sphincter, complex urethral catheter placement  Surgeon: Pryor Curia MD  Resident: Dr. Fredrik Rigger  Anesthesia: General  Complications: None  EBL: Less than 50 cc  Specimens: 1.  Culture from perineal incision 2.  Culture from scrotal opening  Disposition of specimens: Microbiology lab  Indication: Eddie Park is an 84 year old gentleman who had an artificial urinary sphincter placed approximately 12 years ago at [facility].  He recently presented and was found to have findings consistent with erosion of his artificial urinary sphincter.  Due to the fact that he was anticoagulated at the time, he was admitted to the hospital for IV antibiotics and his anticoagulation was reversed.  He presents today for removal of his artificial urinary sphincter after correction of his anticoagulation.  I have reviewed the potential risks, complications, and the expected recovery process of this procedure with the patient, his wife, and his daughter.  They give informed consent to proceed.  Description of procedure: The patient was taken to the operating room and a general anesthetic was administered.  He was maintained on broad-spectrum IV antibiotics.  He was prepped and draped in usual sterile fashion after being placed in the dorsal lithotomy position.  A preoperative timeout was performed.  Flexible cystourethroscopy was performed.  The anterior urethra appeared normal up to the bulbar urethra where erosion of the cuff was noted.  The flexible cystoscope was able to be navigated past eroded cuff again into the bladder.  A 0.38 sensor guidewire was then advanced over the wire and a 16 [place] council tip catheter was placed over the wire into the bladder.  The bladder was drained.  Tacking sutures were then placed in the  scrotum to help assist exposing the perineum.  A midline perineal incision was then made between the scrotum and the rectum measuring approximately 3 cm.  Dissection then proceeded through the subcutaneous tissues.  The bulbospongiosus muscle was not easily identified due to overlying induration where the cuff was easily palpable.  Dissection proceeded straight down onto the cuff.  The latch to the cup was identified and was able to be cut thereby releasing the cuff.  The cuff was then able to be removed from around the urethra.  Attention then turned to the scrotum.  The patient's scrotal pump had already completely extruded and was easily identified.  At this point aerobic and anaerobic cultures were taken from both the scrotal wound and the perineal wound disease were the 2 sites that were noted to have erosion.  Attention then turned to the right lower quadrant of the abdomen.  An incision was made over the patient's prior incision and carried down through the subcutaneous tissues with electrocautery.  The tubing was easily palpable and dissection proceeded directly onto the tubing.  Both tubes were identified and were able to be brought into the incision.  Dissection then continued on top of the tubing down to the reservoir.  The reservoir was able to be removed.  Both pieces of tubing were then cut in the scrotal pump and urethral cuff were able to be removed with the device intact.  The reservoir was also removed.  All incision sites were then copiously irrigated with antibiotic solution.  The perineal incision was closed in multiple layers after hemostasis was achieved.  This included the bulbospongiosus and subcutaneous layers with Vicryl suture and the skin was reapproximated with  4-0 Monocryl suture.  Dermabond was applied to the skin.  Similarly, the small right lower quadrant incision was closed with a running 3-0 Vicryl fascial layer followed by a 4-0 Monocryl skin layer.  Dermabond was also applied to  this incision.  The scrotal incision was slightly extended.  No gross purulent material was identified.  After this is been irrigated, a single chromic suture was used to reapproximate the skin but this incision was otherwise left open to drain.  A fluff dressing was applied to the scrotum.  Patient stacking sutures were removed.  He tolerated the procedure well without complications.  He was able to be transferred to the recovery unit after reversal of anesthesia.

## 2018-03-07 NOTE — Progress Notes (Signed)
Patient ID: Eddie Park, male   DOB: 06/15/1927, 84 y.o.   MRN: [id number]  Day of Surgery Subjective: Pt doing well.  No pain  INR 1.7 this morning after Vit K yesterday   Objective: Vital signs in last 24 hours: Temp:  [97.6 F (36.4 C)-98.8 F (37.1 C)] 98.8 F (37.1 C) (06/16 0523) Pulse Rate:  [59-64] 61 (06/16 0523) Resp:  [16-18] 18 (06/16 0523) BP: (108-116)/(43-74) 108/74 (06/16 0523) SpO2:  [95 %-100 %] 95 % (06/16 0523)  Intake/Output from previous day: 06/15 0701 - 06/16 0700 In: 558.8 [P.O.:180; I.V.:278.8; IV Piggyback:100] Out: 375 [Urine:375] Intake/Output this shift: No intake/output data recorded.  Physical Exam:  General: Alert and oriented GU: AUS pump is completely extruded out of lateral scrotum but no erythema, purulent drainage, or tenderness.     Lab Results: Recent Labs    03/05/18 1450 03/07/18 0414  HGB 8.4* 7.7*  HCT 25.8* 23.6*   BMET Recent Labs    03/05/18 1450 03/07/18 0414  NA 140 140  K 3.9 3.4*  CL 101 102  CO2 30 30  GLUCOSE 105* 89  BUN 59* 47*  CREATININE 1.69* 1.44*  CALCIUM 8.9 8.4*   Component     Latest Ref Rng & Units 03/06/2018 03/06/2018 03/07/2018 03/07/2018         4:06 AM  7:41 PM  4:14 AM  6:31 AM  INR      2.44 1.75 1.71 1.65    Studies/Results: No results found.  Assessment/Plan: Eroded AUS: Will give FFP this morning to ensure INR is appropriate. Plan for remove of all components of AUS this morning. Continue broad spectrum coverage during this period w ith Vancomycin and Cefepime.     LOS: 2 days   Alla Feeling, MD 03/07/2018, 7:28 AM

## 2018-03-07 NOTE — Anesthesia Procedure Notes (Signed)
Procedure Name: LMA Insertion Date/Time: 03/07/2018 9:48 AM Performed by: Lissa Morales, CRNA Pre-anesthesia Checklist: Patient identified, Emergency Drugs available, Suction available and Patient being monitored Patient Re-evaluated:Patient Re-evaluated prior to induction Oxygen Delivery Method: Circle system utilized Preoxygenation: Pre-oxygenation with 100% oxygen Induction Type: IV induction LMA: LMA with gastric port inserted LMA Size: 4.0 Tube type: Oral Number of attempts: 1 Airway Equipment and Method: Oral airway Placement Confirmation: positive ETCO2 Tube secured with: Tape Dental Injury: Teeth and Oropharynx as per pre-operative assessment

## 2018-03-07 NOTE — Transfer of Care (Addendum)
Immediate Anesthesia Transfer of Care Note  Patient: Eddie Park  Procedure(s) Performed: ARTIFICIAL URINARY SPHINCTER REMOVAL (N/A Abdomen) CYSTOSCOPY FLEXIBLE (N/A Urethra)  Patient Location: PACU  Anesthesia Type:General  Level of Consciousness: awake and patient cooperative  Airway & Oxygen Therapy: Patient Spontanous Breathing and Patient connected to face mask oxygen  Post-op Assessment: Report given to RN, Post -op Vital signs reviewed and stable and Patient moving all extremities X 4  Post vital signs: stable  Last Vitals:  Vitals Value Taken Time  BP 133/50 03/07/2018 11:05 AM  Temp    Pulse 52 03/07/2018 11:09 AM  Resp 13 03/07/2018 11:09 AM  SpO2 100 % 03/07/2018 11:09 AM  Vitals shown include unvalidated device data.  Last Pain:  Vitals:   03/07/18 0915  TempSrc: Oral  PainSc:          Complications: No apparent anesthesia complications  Hearing aid in left ear as preop

## 2018-03-08 ENCOUNTER — Encounter (HOSPITAL_COMMUNITY): Payer: Self-pay | Admitting: Urology

## 2018-03-08 LAB — CBC
HCT: 24 % — ABNORMAL LOW (ref 39.0–52.0)
Hemoglobin: 7.6 g/dL — ABNORMAL LOW (ref 13.0–17.0)
MCH: 30.4 pg (ref 26.0–34.0)
MCHC: 31.7 g/dL (ref 30.0–36.0)
MCV: 96 fL (ref 78.0–100.0)
Platelets: 154 10*3/uL (ref 150–400)
RBC: 2.5 MIL/uL — AB (ref 4.22–5.81)
RDW: 13.6 % (ref 11.5–15.5)
WBC: 7.5 10*3/uL (ref 4.0–10.5)

## 2018-03-08 LAB — BPAM FFP
BLOOD PRODUCT EXPIRATION DATE: 201906212359
Blood Product Expiration Date: 201906212359
ISSUE DATE / TIME: 201906160923
ISSUE DATE / TIME: 201906160825
UNIT TYPE AND RH: 5100
Unit Type and Rh: 5100

## 2018-03-08 LAB — PREPARE FRESH FROZEN PLASMA
UNIT DIVISION: 0
Unit division: 0

## 2018-03-08 LAB — PROTIME-INR
INR: 1.44
Prothrombin Time: 17.4 seconds — ABNORMAL HIGH (ref 11.4–15.2)

## 2018-03-08 MED ORDER — WARFARIN SODIUM 5 MG PO TABS
5.0000 mg | ORAL_TABLET | Freq: Every day | ORAL | Status: DC
  Administered 2018-03-08 – 2018-03-10 (×3): 5 mg via ORAL
  Filled 2018-03-08 (×3): qty 1

## 2018-03-08 MED ORDER — WARFARIN - PHYSICIAN DOSING INPATIENT
Freq: Every day | Status: DC
Start: 2018-03-08 — End: 2018-03-11
  Administered 2018-03-10: 18:00:00

## 2018-03-08 NOTE — Progress Notes (Signed)
Patient ID: Eddie Park, male   DOB: [birth year], 84 y.o.   MRN: [id number]  1 Day Post-Op Subjective: S/P removal of AUS yesterday.  No issues overnight.  Pain controlled.  Objective: Vital signs in last 24 hours: Temp:  [97.4 F (36.3 C)-99.3 F (37.4 C)] 99.3 F (37.4 C) (06/17 0500) Pulse Rate:  [58-66] 60 (06/17 0524) Resp:  [12-18] 18 (06/17 0500) BP: (103-136)/(41-56) 114/44 (06/17 0543) SpO2:  [93 %-100 %] 94 % (06/17 0500)  Intake/Output from previous day: 06/16 0701 - 06/17 0700 In: 1473 [P.O.:240; I.V.:700; Blood:533] Out: 405 [Urine:400; Blood:5] Intake/Output this shift: No intake/output data recorded.  Physical Exam:  General: Alert and oriented CV: RRR Lungs: Clear Abdomen: Soft, ND, incision clean and intact GU: Scrotal opening with mild erythema, no purulent drainage, perineal incision clean and intact, Foley in place with blood around catheter at meatus Ext: NT, No erythema  Lab Results: Recent Labs    03/05/18 1450 03/07/18 0414  HGB 8.4* 7.7*  HCT 25.8* 23.6*   CBC Latest Ref Rng & Units 03/07/2018 03/05/2018 05/21/2017  WBC 4.0 - 10.5 K/uL 5.7 7.1 5.0  Hemoglobin 13.0 - 17.0 g/dL 7.7(L) 8.4(L) 9.5(L)  Hematocrit 39.0 - 52.0 % 23.6(L) 25.8(L) 28.7(L)  Platelets 150 - 400 K/uL 171 169 131(L)   Labs for today pending.  BMET Recent Labs    03/05/18 1450 03/07/18 0414  NA 140 140  K 3.9 3.4*  CL 101 102  CO2 30 30  GLUCOSE 105* 89  BUN 59* 47*  CREATININE 1.69* 1.44*  CALCIUM 8.9 8.4*   INR 1.44  Studies/Results: No results found.  Assessment/Plan: Eroded AUS s/p removal POD # 1 - Continue IV antibiotics with cultures pending from OR.  Will tailor antibiotic therapy once cultures return. - Will consider restarting warfarin tonight if no bleeding concerns   LOS: 3 days   Naylee Frankowski,LES 03/08/2018, 7:18 AM

## 2018-03-09 LAB — PROTIME-INR
INR: 1.42
Prothrombin Time: 17.3 seconds — ABNORMAL HIGH (ref 11.4–15.2)

## 2018-03-09 NOTE — Progress Notes (Signed)
Patient ID: Eddie Park, male   DOB: 22-Aug-1927, 84 y.o.   MRN: [id number]  2 Days Post-Op Subjective: No new complaints.  Pain is minimal.   He was able to get out of bed to a chair yesterday with assistance.  Objective: Vital signs in last 24 hours: Temp:  [97.6 F (36.4 C)-99.3 F (37.4 C)] 99.3 F (37.4 C) (06/18 0406) Pulse Rate:  [59-65] 59 (06/18 0406) Resp:  [16-17] 16 (06/18 0406) BP: (102-119)/(44-53) 108/53 (06/18 0406) SpO2:  [95 %-99 %] 95 % (06/18 0406)  Intake/Output from previous day: 06/17 0701 - 06/18 0700 In: 5970 [P.O.:760; IV Piggyback:5210] Out: 1350 [Urine:1350] Intake/Output this shift: No intake/output data recorded.  Physical Exam:  General: Alert and oriented Abd: Incision C/D/I GU: Scrotal induration is improved.  Minimal drainage.  Perineal incision is clean dry and intact.  Lab Results: Recent Labs    03/07/18 0414 03/08/18 0324  HGB 7.7* 7.6*  HCT 23.6* 24.0*   CBC Latest Ref Rng & Units 03/08/2018 03/07/2018 03/05/2018  WBC 4.0 - 10.5 K/uL 7.5 5.7 7.1  Hemoglobin 13.0 - 17.0 g/dL 7.6(L) 7.7(L) 8.4(L)  Hematocrit 39.0 - 52.0 % 24.0(L) 23.6(L) 25.8(L)  Platelets 150 - 400 K/uL 154 171 169     BMET Recent Labs    03/07/18 0414  NA 140  K 3.4*  CL 102  CO2 30  GLUCOSE 89  BUN 47*  CREATININE 1.44*  CALCIUM 8.4*     Studies/Results: No results found.  Assessment/Plan:   1. Eroded artificial urinary sphincter status post removal postoperative day 2:  Continuing IV antibiotics pending cultures from the operating room.  He will be evaluated by physical therapy today to begin discharge planning.  He has restarted his anticoagulation as of last evening.  There are no concerns for bleeding issues.   LOS: 4 days   Jolanta Cabeza,LES 03/09/2018, 10:25 AM

## 2018-03-09 NOTE — Progress Notes (Signed)
Changed the "fluff" gauze dressing and patient has new mesh pants.

## 2018-03-09 NOTE — Progress Notes (Addendum)
Pharmacy Antibiotic Note  Eddie Park is a 84 y.o. male admitted on 03/05/2018 with eroded artificial sphincter.  Pharmacy has been consulted for vancomycin and cefepime dosing.  Today, 03/09/2018:  Afebrile, WBC stable WNL  Surgical cultures reincubated for better growth  SCr elevated but improved; last checked 6/16  Plan:  Continue Cefepime 1 gm IV q24  Continue Vancomycin 500 mg IV q48 for AUC ~407 using SCr 1.44  Height: 5\' 6"  (167.6 cm) Weight: 95 lb 10.9 oz (43.4 kg) IBW/kg (Calculated) : 63.8  Temp (24hrs), Avg:98.8 F (37.1 C), Min:97.8 F (36.6 C), Max:99.3 F (37.4 C)  Recent Labs  Lab 03/05/18 1450 03/07/18 0414 03/08/18 0324  WBC 7.1 5.7 7.5  CREATININE 1.69* 1.44*  --     Estimated Creatinine Clearance: 21.3 mL/min (A) (by C-G formula based on SCr of 1.44 mg/dL (H)).    No Known Allergies  Antimicrobials this admission: 6/14 cefazolin >> 6/15 6/15 vanc >> 6/15 cefepime >>  Microbiology results: 6/16 MRSA PCR neg, MSSA PCR neg 6/16 surgical/deep wound scrotal opening cx >> reincubated 6/16 cx from perineal incision >> reincubated   Thank you for allowing pharmacy to be a part of this patient's care.  Reuel Boom, PharmD, BCPS [phone] 03/09/2018, 2:09 PM

## 2018-03-09 NOTE — Progress Notes (Signed)
Patient ID: Eddie Park, male   DOB: 05-27-1927, 84 y.o.   MRN: [id number]  Pt doing well.  No changes or new complaints.   Still waiting on PT eval.  Cultures negative and still pending.  Discharge pending PT eval.

## 2018-03-09 NOTE — Care Management Important Message (Signed)
Important Message  Patient Details  Name: DMITRI PETTIGREW MRN: [id number] Date of Birth: 1926-12-06   Medicare Important Message Given:  Yes    Kerin Salen 03/09/2018, 11:02 AMImportant Message  Patient Details  Name: RESHAD SAAB MRN: [id number] Date of Birth: 01/08/1927   Medicare Important Message Given:  Yes    Kerin Salen 03/09/2018, 11:02 AM

## 2018-03-10 LAB — PROTIME-INR
INR: 1.47
Prothrombin Time: 17.7 seconds — ABNORMAL HIGH (ref 11.4–15.2)

## 2018-03-10 NOTE — Evaluation (Addendum)
Physical Therapy Evaluation Patient Details Name: Eddie Park MRN: [id number] DOB: September 06, 1927 Today's Date: 03/10/2018   History of Present Illness  84 yo male adm 03/05/18, now S/P removal of AUS per Dr. Alinda Money; PMH: HF, CKD stage III, HTN, afib  Clinical Impression  Pt admitted with above diagnosis. Pt currently with functional limitations due to the deficits listed below (see PT Problem List).  Pt will benefit from skilled PT to increase their independence and safety with mobility to allow discharge to the venue listed below.    Pt with mildly unsteady gait and LOB x1 within 160'; recommend HHPT, use of RW, and supervision for mobility at home; will follow in acute setting  Pt appears to have been coughing/spitting up eggs from breakfast (basin with liquid & eggs in it), possibly also pocketing as he continued to spit out egg during session; drinking water without difficulty     Follow Up Recommendations Home health PT;Supervision for mobility/OOB    Equipment Recommendations  None recommended by PT    Recommendations for Other Services       Precautions / Restrictions Precautions Precautions: Fall Restrictions Weight Bearing Restrictions: No      Mobility  Bed Mobility Overal bed mobility: Needs Assistance Bed Mobility: Supine to Sit     Supine to sit: HOB elevated;Supervision     General bed mobility comments: incr time and repetitious cues to complete task  Transfers Overall transfer level: Needs assistance Equipment used: Rolling walker (2 wheeled) Transfers: Sit to/from Stand Sit to Stand: Min guard         General transfer comment: cues for safety and hand placement  Ambulation/Gait Ambulation/Gait assistance: Min guard Gait Distance (Feet): 160 Feet Assistive device: Rolling walker (2 wheeled) Gait Pattern/deviations: Drifts right/left;Step-through pattern;Decreased stride length;Shuffle     General Gait Details: decr push off bil, unsteady  initially improved with distance; LOB x 1  with min/guard for safety--pt recovered but delayed reaction time  Stairs            Wheelchair Mobility    Modified Rankin (Stroke Patients Only)       Balance Overall balance assessment: (pt reports falling asleep while eating and fallign out of chair to floor (unsure of date/time of this fall)) Sitting-balance support: Feet supported;No upper extremity supported Sitting balance-Leahy Scale: Good       Standing balance-Leahy Scale: Fair Standing balance comment: requires UE support for dynamic activity                             Pertinent Vitals/Pain Pain Assessment: Faces Faces Pain Scale: No hurt    Home Living Family/patient expects to be discharged to:: Private residence Living Arrangements: Spouse/significant other Available Help at Discharge: Family;Available 24 hours/day Type of Home: House Home Access: Stairs to enter Entrance Stairs-Rails: Psychiatric nurse of Steps: 3 Home Layout: One level Home Equipment: Walker - 2 wheels Additional Comments: PLOF taken above is partially from pt, aprtially from previous notes in chart, may need to verify    Prior Function     Gait / Transfers Assistance Needed: pt reports he doesn't necessarily use RW but  has "a few of them"  ADL's / Homemaking Assistance Needed: unsure--no family present        Hand Dominance        Extremity/Trunk Assessment   Upper Extremity Assessment Upper Extremity Assessment: Generalized weakness    Lower Extremity Assessment Lower Extremity Assessment:  Generalized weakness       Communication   Communication: HOH  Cognition Arousal/Alertness: Awake/alert Behavior During Therapy: WFL for tasks assessed/performed Overall Cognitive Status: No family/caregiver present to determine baseline cognitive functioning                                 General Comments: easily distracted, no family  present, follows one step commands consistently but decr hearing is a barrier      General Comments      Exercises     Assessment/Plan    PT Assessment Patient needs continued PT services  PT Problem List Decreased balance;Decreased knowledge of use of DME;Decreased activity tolerance;Decreased mobility       PT Treatment Interventions DME instruction;Therapeutic activities;Gait training;Functional mobility training;Therapeutic exercise;Patient/family education;Balance training    PT Goals (Current goals can be found in the Care Plan section)  Acute Rehab PT Goals Patient Stated Goal: home soon PT Goal Formulation: With patient Time For Goal Achievement: 03/17/18 Potential to Achieve Goals: Good    Frequency Min 3X/week   Barriers to discharge        Co-evaluation               AM-PAC PT "6 Clicks" Daily Activity  Outcome Measure Difficulty turning over in bed (including adjusting bedclothes, sheets and blankets)?: A Little Difficulty moving from lying on back to sitting on the side of the bed? : A Little Difficulty sitting down on and standing up from a chair with arms (e.g., wheelchair, bedside commode, etc,.)?: A Little Help needed moving to and from a bed to chair (including a wheelchair)?: A Little Help needed walking in hospital room?: A Little Help needed climbing 3-5 steps with a railing? : A Little 6 Click Score: 18    End of Session Equipment Utilized During Treatment: Gait belt Activity Tolerance: Patient tolerated treatment well Patient left: in chair;with call bell/phone within reach;with chair alarm set;with nursing/sitter in room   PT Visit Diagnosis: Unsteadiness on feet (R26.81)    Time: 4742-5956 PT Time Calculation (min) (ACUTE ONLY): 25 min   Charges:   PT Evaluation $PT Eval Low Complexity: 1 Low PT Treatments $Gait Training: 8-22 mins   PT G CodesKenyon Ana, PT Pager:  [phone] 03/10/2018   [facility] 03/10/2018, 10:23 AM

## 2018-03-10 NOTE — Progress Notes (Signed)
Patient ID: Eddie Park, male   DOB: 16-May-1927, 84 y.o.   MRN: [id number]  3 Days Post-Op Subjective: Pt without new complaints.  PT evaluated patient this morning and recommended home health PT.  Objective: Vital signs in last 24 hours: Temp:  [97.8 F (36.6 C)-99.1 F (37.3 C)] 98.4 F (36.9 C) (06/19 0553) Pulse Rate:  [59-60] 59 (06/19 0553) Resp:  [16-18] 18 (06/19 0553) BP: (114-131)/(50-55) 131/50 (06/19 0553) SpO2:  [97 %-98 %] 98 % (06/19 0553)  Intake/Output from previous day: 06/18 0701 - 06/19 0700 In: -  Out: 1630 [Urine:1630] Intake/Output this shift: Total I/O In: -  Out: 450 [Urine:450]  Physical Exam:  General: Alert and oriented Abd; Incision healing well, petechial rash over right lower quadrant noted GU: Scrotal opening healing without increased induration or drainage, perineal incision healing well, urine clear  Lab Results: Recent Labs    03/08/18 0324  HGB 7.6*  HCT 24.0*   CBC Latest Ref Rng & Units 03/08/2018 03/07/2018 03/05/2018  WBC 4.0 - 10.5 K/uL 7.5 5.7 7.1  Hemoglobin 13.0 - 17.0 g/dL 7.6(L) 7.7(L) 8.4(L)  Hematocrit 39.0 - 52.0 % 24.0(L) 23.6(L) 25.8(L)  Platelets 150 - 400 K/uL 154 171 169     BMET INR 1.47  Studies/Results: Culture from OR with GNRs  Assessment/Plan: Eroded AUS s/p removal: PT has recommend home health PT and will begin making arrangements.  Continue current antibiotics and hopefully will have further information regarding cultures by tomorrow.  Will plan for D/C tomorrow on oral antibiotic therapy with plans to f/u with Dr. Gloriann Loan for pericatheter urethrogram in a few weeks.  Continue warfarin.   LOS: 5 days   Marnesha Gagen,LES 03/10/2018, 12:14 PM

## 2018-03-10 NOTE — Care Management Note (Signed)
Case Management Note  Patient Details  Name: Eddie Park MRN: [id number] Date of Birth: [birth year]  Subjective/Objective:   Pt admitted for surgery                 Action/Plan:  Plan to discharge home with [place]   Expected Discharge Date:  (unknown)               Expected Discharge Plan:  [place]  In-House Referral:     Discharge planning Services  CM Consult  Post Acute Care Choice:    Choice offered to:  Adult Children  DME Arranged:    DME Agency:     HH Arranged:  RN, PT [place] Agency:  [facility]  Status of Service:  Completed, signed off  If discussed at [place] of Stay Meetings, dates discussed:    Additional CommentsPurcell Mouton, RN 03/10/2018, 4:02 PM

## 2018-03-11 LAB — PROTIME-INR
INR: 1.44
PROTHROMBIN TIME: 17.4 s — AB (ref 11.4–15.2)

## 2018-03-11 MED ORDER — CIPROFLOXACIN HCL 500 MG PO TABS: 500.0000 mg | ORAL_TABLET | Freq: Every day | ORAL | 0 refills | Status: DC

## 2018-03-11 MED ORDER — CIPROFLOXACIN HCL 500 MG PO TABS
500.0000 mg | ORAL_TABLET | Freq: Every day | ORAL | Status: DC
  Administered 2018-03-11: 500 mg via ORAL
  Filled 2018-03-11: qty 1

## 2018-03-11 MED ORDER — CEFDINIR 300 MG PO CAPS
300.0000 mg | ORAL_CAPSULE | Freq: Every day | ORAL | Status: DC
  Filled 2018-03-11: qty 1

## 2018-03-11 NOTE — Discharge Instructions (Signed)
Call if fever > 101.

## 2018-03-11 NOTE — Discharge Summary (Signed)
Date of admission: 03/05/2018  Date of discharge: 03/11/2018  Admission diagnosis: Eroded artificial urinary sphincter  Discharge diagnosis: Eroded artificial urinary sphincter  Secondary diagnoses: Atrial fibrillation  History and Physical: For full details, please see admission history and physical. Briefly, Eddie Park is a 84 y.o. year old patient with a history of an artificial urinary sphincter placed at [facility] about 12 years ago.  He presented with scrotal and urethral erosion.  Hospital Course: He was admitted to the hospital on 03/05/18 and placed on IV antibiotics.  He had complete erosion of his scrotal pump.  He was anticoagulated at the time and surgery was delayed until his anticoagulation was reversed.  He was able to safely go to the OR on 03/07/18 and his artifical sphincter was removed.  He was also noted to have urethral erosion intraoperatively.  A catheter was placed.  He recovered uneventfully.  He was maintained on IV antibiotics until his culture returned.  It was positive for Pseudomonas sensitive to ciprofloxacin.  He will be discharged on ciprofloxacin.  He has been instructed to resume his warfarin every other day until he is off his antibiotic.  Laboratory values: No results for input(s): HGB, HCT in the last 72 hours. No results for input(s): CREATININE in the last 72 hours.  Disposition: Home  Discharge instruction: The patient was instructed to be ambulatory but told to refrain from heavy lifting, strenuous activity, or driving.   Discharge medications:  Allergies as of 03/11/2018   No Known Allergies     Medication List    TAKE these medications   cholecalciferol 1000 units tablet Commonly known as:  VITAMIN D Take 2,000 Units by mouth daily.   ciprofloxacin 500 MG tablet Commonly known as:  CIPRO Take 1 tablet (500 mg total) by mouth daily.   fish oil-omega-3 fatty acids 1000 MG capsule Take 2 g by mouth daily.   JANTOVEN 5 MG tablet Generic  drug:  warfarin Take as directed. If you are unsure how to take this medication, talk to your nurse or doctor. Original instructions:  TAKE AS DIRECTED BY COUMADIN CLINIC What changed:  See the new instructions. He will take this every other day until he finishes ciprofloxacin.   levETIRAcetam 500 MG tablet Commonly known as:  KEPPRA Take 1 tablet (500 mg total) by mouth 2 (two) times daily.   lisinopril 2.5 MG tablet Commonly known as:  PRINIVIL,ZESTRIL Take 1 tablet (2.5 mg total) by mouth daily.   multivitamin with minerals Tabs tablet Take 1 tablet by mouth daily.   sertraline 100 MG tablet Commonly known as:  ZOLOFT Take 100 mg by mouth daily.   torsemide 20 MG tablet Commonly known as:  DEMADEX Take 1 tablet (20 mg total) by mouth daily.   vitamin B-12 1000 MCG tablet Commonly known as:  CYANOCOBALAMIN Take 1,000 mcg by mouth every other day.       Followup:  Follow-up Information    Lucas Mallow, MD Follow up.   Specialty:  Urology Why:  Will arrange follow up office visit and radiologic exam (our office will call to schedule) Contact information: [place] [phone]

## 2018-03-11 NOTE — Progress Notes (Signed)
Patient ID: Eddie Park, male   DOB: [birth year], 84 y.o.   MRN: [id number]  4 Days Post-Op Subjective: Pt doing well.  No new complaints.   Objective: Vital signs in last 24 hours: Temp:  [97.5 F (36.4 C)-98.3 F (36.8 C)] 97.5 F (36.4 C) (06/20 0435) Pulse Rate:  [59] 59 (06/20 0435) Resp:  [14-16] 16 (06/20 0435) BP: (105-121)/(47-54) 121/48 (06/20 0435) SpO2:  [96 %-100 %] 100 % (06/20 0435)  Intake/Output from previous day: 06/19 0701 - 06/20 0700 In: 240 [P.O.:240] Out: 1725 [Urine:1725] Intake/Output this shift: No intake/output data recorded.  Physical Exam:  General: Alert and oriented GU: Foley draining well Abd/GU: Incisions healing well, scrotum with decreasing erythema and induration  Lab Results: No results for input(s): HGB, HCT in the last 72 hours. BMET No results for input(s): NA, K, CL, CO2, GLUCOSE, BUN, CREATININE, CALCIUM in the last 72 hours.   Studies/Results: Culture positive for Pseudomonas sensitive to Cipro  Assessment/Plan: D/ C home with Cipro   LOS: 6 days   Jenyfer Trawick,LES 03/11/2018, 9:53 AM

## 2018-03-11 NOTE — Progress Notes (Signed)
Pt, pt's wife and daughter given discharge instructions with understanding. No questions at this time. IV d/c and pt given DNR Gold form. Pt wheeled out via wheelchair.

## 2018-03-11 NOTE — Plan of Care (Signed)
  Problem: Skin Integrity: Goal: Demonstration of wound healing without infection will improve Outcome: Completed/Met   

## 2018-03-12 LAB — AEROBIC/ANAEROBIC CULTURE (SURGICAL/DEEP WOUND)

## 2018-03-12 LAB — AEROBIC/ANAEROBIC CULTURE W GRAM STAIN (SURGICAL/DEEP WOUND)

## 2018-03-15 ENCOUNTER — Other Ambulatory Visit: Payer: Self-pay | Admitting: Urology

## 2018-03-15 ENCOUNTER — Other Ambulatory Visit: Payer: Self-pay | Admitting: *Deleted

## 2018-03-15 ENCOUNTER — Ambulatory Visit (INDEPENDENT_AMBULATORY_CARE_PROVIDER_SITE_OTHER): Payer: Medicare Other | Admitting: Cardiovascular Disease

## 2018-03-15 DIAGNOSIS — Z7901 Long term (current) use of anticoagulants: Secondary | ICD-10-CM | POA: Diagnosis not present

## 2018-03-15 DIAGNOSIS — Z5181 Encounter for therapeutic drug level monitoring: Secondary | ICD-10-CM | POA: Diagnosis not present

## 2018-03-15 DIAGNOSIS — R339 Retention of urine, unspecified: Secondary | ICD-10-CM | POA: Diagnosis not present

## 2018-03-15 DIAGNOSIS — N3942 Incontinence without sensory awareness: Secondary | ICD-10-CM | POA: Diagnosis not present

## 2018-03-15 DIAGNOSIS — Z87891 Personal history of nicotine dependence: Secondary | ICD-10-CM | POA: Diagnosis not present

## 2018-03-15 DIAGNOSIS — I4821 Permanent atrial fibrillation: Secondary | ICD-10-CM

## 2018-03-15 DIAGNOSIS — I482 Chronic atrial fibrillation: Secondary | ICD-10-CM

## 2018-03-15 DIAGNOSIS — Z950 Presence of cardiac pacemaker: Secondary | ICD-10-CM | POA: Diagnosis not present

## 2018-03-15 DIAGNOSIS — Z8546 Personal history of malignant neoplasm of prostate: Secondary | ICD-10-CM | POA: Diagnosis not present

## 2018-03-15 DIAGNOSIS — I2510 Atherosclerotic heart disease of native coronary artery without angina pectoris: Secondary | ICD-10-CM | POA: Diagnosis not present

## 2018-03-15 DIAGNOSIS — I10 Essential (primary) hypertension: Secondary | ICD-10-CM | POA: Diagnosis not present

## 2018-03-15 DIAGNOSIS — Z48816 Encounter for surgical aftercare following surgery on the genitourinary system: Secondary | ICD-10-CM | POA: Diagnosis not present

## 2018-03-15 DIAGNOSIS — F419 Anxiety disorder, unspecified: Secondary | ICD-10-CM | POA: Diagnosis not present

## 2018-03-15 DIAGNOSIS — T83113A Breakdown (mechanical) of other urinary stents, initial encounter: Secondary | ICD-10-CM

## 2018-03-15 LAB — POCT INR: INR: 1.6 — AB (ref 2.0–3.0)

## 2018-03-15 NOTE — Patient Outreach (Signed)
[facility]) Care Management  03/15/2018  Lavoy Bernards Keesey 02-13-1927 [id number]  Referral via Red Alert-EMMI-General Discharge:  Reason: Scheduled follow up - "no"  Per chart review: Admission 6/14-6/20/2019 DX-Removal eroded artificial sphincter  Telephone call to patient/caregiver; left HIPPA compliant voice mail requesting call back.  Plan: Follow up 2-4 business days.  Sherrin Daisy, RN BSN [place] Management Coordinator [facility] Care Management  [phone]

## 2018-03-15 NOTE — Patient Outreach (Signed)
[place] [facility]) Care Management  03/15/2018  Male Minish Grizzle 10-Apr-1927 [id number]  Referral via RED Alert-EMMI-General Discharge; Day#1  03/13/2018: Reason: Scheduled follow up-no  Telephone call to patient; left HIPPA compliant voice mail requesting call back.  Plan: Follow up 2-4 business days.  Sherrin Daisy, RN BSN [place] Management Coordinator [facility] Care Management  [phone]

## 2018-03-16 ENCOUNTER — Inpatient Hospital Stay (HOSPITAL_COMMUNITY): Admission: RE | Admit: 2018-03-16 | Payer: Medicare Other | Source: Ambulatory Visit

## 2018-03-16 DIAGNOSIS — Z48816 Encounter for surgical aftercare following surgery on the genitourinary system: Secondary | ICD-10-CM | POA: Diagnosis not present

## 2018-03-16 DIAGNOSIS — I10 Essential (primary) hypertension: Secondary | ICD-10-CM | POA: Diagnosis not present

## 2018-03-16 DIAGNOSIS — R339 Retention of urine, unspecified: Secondary | ICD-10-CM | POA: Diagnosis not present

## 2018-03-16 DIAGNOSIS — I2510 Atherosclerotic heart disease of native coronary artery without angina pectoris: Secondary | ICD-10-CM | POA: Diagnosis not present

## 2018-03-16 DIAGNOSIS — N3942 Incontinence without sensory awareness: Secondary | ICD-10-CM | POA: Diagnosis not present

## 2018-03-16 DIAGNOSIS — F419 Anxiety disorder, unspecified: Secondary | ICD-10-CM | POA: Diagnosis not present

## 2018-03-18 ENCOUNTER — Other Ambulatory Visit: Payer: Self-pay | Admitting: *Deleted

## 2018-03-18 DIAGNOSIS — Z48816 Encounter for surgical aftercare following surgery on the genitourinary system: Secondary | ICD-10-CM | POA: Diagnosis not present

## 2018-03-18 DIAGNOSIS — I2510 Atherosclerotic heart disease of native coronary artery without angina pectoris: Secondary | ICD-10-CM | POA: Diagnosis not present

## 2018-03-18 DIAGNOSIS — I10 Essential (primary) hypertension: Secondary | ICD-10-CM | POA: Diagnosis not present

## 2018-03-18 DIAGNOSIS — N3942 Incontinence without sensory awareness: Secondary | ICD-10-CM | POA: Diagnosis not present

## 2018-03-18 DIAGNOSIS — F419 Anxiety disorder, unspecified: Secondary | ICD-10-CM | POA: Diagnosis not present

## 2018-03-18 DIAGNOSIS — R339 Retention of urine, unspecified: Secondary | ICD-10-CM | POA: Diagnosis not present

## 2018-03-19 ENCOUNTER — Other Ambulatory Visit: Payer: Self-pay | Admitting: *Deleted

## 2018-03-19 DIAGNOSIS — Z48816 Encounter for surgical aftercare following surgery on the genitourinary system: Secondary | ICD-10-CM | POA: Diagnosis not present

## 2018-03-19 DIAGNOSIS — R339 Retention of urine, unspecified: Secondary | ICD-10-CM | POA: Diagnosis not present

## 2018-03-19 DIAGNOSIS — I10 Essential (primary) hypertension: Secondary | ICD-10-CM | POA: Diagnosis not present

## 2018-03-19 DIAGNOSIS — N3942 Incontinence without sensory awareness: Secondary | ICD-10-CM | POA: Diagnosis not present

## 2018-03-19 DIAGNOSIS — F419 Anxiety disorder, unspecified: Secondary | ICD-10-CM | POA: Diagnosis not present

## 2018-03-19 DIAGNOSIS — I2510 Atherosclerotic heart disease of native coronary artery without angina pectoris: Secondary | ICD-10-CM | POA: Diagnosis not present

## 2018-03-19 NOTE — Patient Outreach (Signed)
[facility]) Care Management  03/19/2018  Eddie Park [birth year] [id number]   Referral via Red Alert-EMMI-General Discharge:  Reason: Scheduled follow up - "no"  Per chart review: Admission 6/14-6/20/2019 DX-Removal eroded artificial sphincter  Telephone call x 3 to patient; spouse answered call & advised she was talking calls for patient & had been answering automated calls from [facility]. HIPPA verification received.   Spouse of patient states primary care office called to follow up with patient on day after he was discharged to home. States no office appointment was advised at that time. States there had been no changes in patient's medications. States patient does have follow up appointment with urologist July 30th and he has transportation.   States no further  healthcare concerns at this time. States patient does have home health services since recent discharge.  EMMI call has been addressed.   Plan: Case closure.   Sherrin Daisy, RN BSN [place] Management Coordinator [facility] Care Management  [phone]

## 2018-03-22 ENCOUNTER — Ambulatory Visit (INDEPENDENT_AMBULATORY_CARE_PROVIDER_SITE_OTHER): Payer: Medicare Other | Admitting: Interventional Cardiology

## 2018-03-22 DIAGNOSIS — Z5181 Encounter for therapeutic drug level monitoring: Secondary | ICD-10-CM

## 2018-03-22 DIAGNOSIS — N3942 Incontinence without sensory awareness: Secondary | ICD-10-CM | POA: Diagnosis not present

## 2018-03-22 DIAGNOSIS — I482 Chronic atrial fibrillation: Secondary | ICD-10-CM | POA: Diagnosis not present

## 2018-03-22 DIAGNOSIS — F419 Anxiety disorder, unspecified: Secondary | ICD-10-CM | POA: Diagnosis not present

## 2018-03-22 DIAGNOSIS — I4821 Permanent atrial fibrillation: Secondary | ICD-10-CM

## 2018-03-22 DIAGNOSIS — Z48816 Encounter for surgical aftercare following surgery on the genitourinary system: Secondary | ICD-10-CM | POA: Diagnosis not present

## 2018-03-22 DIAGNOSIS — I10 Essential (primary) hypertension: Secondary | ICD-10-CM | POA: Diagnosis not present

## 2018-03-22 DIAGNOSIS — I2510 Atherosclerotic heart disease of native coronary artery without angina pectoris: Secondary | ICD-10-CM | POA: Diagnosis not present

## 2018-03-22 DIAGNOSIS — R339 Retention of urine, unspecified: Secondary | ICD-10-CM | POA: Diagnosis not present

## 2018-03-22 LAB — POCT INR: INR: 1.5 — AB (ref 2.0–3.0)

## 2018-03-23 DIAGNOSIS — F419 Anxiety disorder, unspecified: Secondary | ICD-10-CM | POA: Diagnosis not present

## 2018-03-23 DIAGNOSIS — I2510 Atherosclerotic heart disease of native coronary artery without angina pectoris: Secondary | ICD-10-CM | POA: Diagnosis not present

## 2018-03-23 DIAGNOSIS — N3942 Incontinence without sensory awareness: Secondary | ICD-10-CM | POA: Diagnosis not present

## 2018-03-23 DIAGNOSIS — R339 Retention of urine, unspecified: Secondary | ICD-10-CM | POA: Diagnosis not present

## 2018-03-23 DIAGNOSIS — Z48816 Encounter for surgical aftercare following surgery on the genitourinary system: Secondary | ICD-10-CM | POA: Diagnosis not present

## 2018-03-23 DIAGNOSIS — I10 Essential (primary) hypertension: Secondary | ICD-10-CM | POA: Diagnosis not present

## 2018-03-24 ENCOUNTER — Ambulatory Visit: Admit: 2018-03-24 | Payer: Medicare Other | Admitting: Urology

## 2018-03-24 DIAGNOSIS — Z48816 Encounter for surgical aftercare following surgery on the genitourinary system: Secondary | ICD-10-CM | POA: Diagnosis not present

## 2018-03-24 DIAGNOSIS — N3942 Incontinence without sensory awareness: Secondary | ICD-10-CM | POA: Diagnosis not present

## 2018-03-24 DIAGNOSIS — I10 Essential (primary) hypertension: Secondary | ICD-10-CM | POA: Diagnosis not present

## 2018-03-24 DIAGNOSIS — F419 Anxiety disorder, unspecified: Secondary | ICD-10-CM | POA: Diagnosis not present

## 2018-03-24 DIAGNOSIS — I2510 Atherosclerotic heart disease of native coronary artery without angina pectoris: Secondary | ICD-10-CM | POA: Diagnosis not present

## 2018-03-24 DIAGNOSIS — R339 Retention of urine, unspecified: Secondary | ICD-10-CM | POA: Diagnosis not present

## 2018-03-24 SURGERY — INSERTION, ARTIFICIAL URINARY SPHINCTER
Anesthesia: General

## 2018-03-25 DIAGNOSIS — F419 Anxiety disorder, unspecified: Secondary | ICD-10-CM | POA: Diagnosis not present

## 2018-03-25 DIAGNOSIS — I10 Essential (primary) hypertension: Secondary | ICD-10-CM | POA: Diagnosis not present

## 2018-03-25 DIAGNOSIS — R339 Retention of urine, unspecified: Secondary | ICD-10-CM | POA: Diagnosis not present

## 2018-03-25 DIAGNOSIS — N3942 Incontinence without sensory awareness: Secondary | ICD-10-CM | POA: Diagnosis not present

## 2018-03-25 DIAGNOSIS — I2510 Atherosclerotic heart disease of native coronary artery without angina pectoris: Secondary | ICD-10-CM | POA: Diagnosis not present

## 2018-03-25 DIAGNOSIS — Z48816 Encounter for surgical aftercare following surgery on the genitourinary system: Secondary | ICD-10-CM | POA: Diagnosis not present

## 2018-03-26 DIAGNOSIS — F419 Anxiety disorder, unspecified: Secondary | ICD-10-CM | POA: Diagnosis not present

## 2018-03-26 DIAGNOSIS — N3942 Incontinence without sensory awareness: Secondary | ICD-10-CM | POA: Diagnosis not present

## 2018-03-26 DIAGNOSIS — I2510 Atherosclerotic heart disease of native coronary artery without angina pectoris: Secondary | ICD-10-CM | POA: Diagnosis not present

## 2018-03-26 DIAGNOSIS — R339 Retention of urine, unspecified: Secondary | ICD-10-CM | POA: Diagnosis not present

## 2018-03-26 DIAGNOSIS — Z48816 Encounter for surgical aftercare following surgery on the genitourinary system: Secondary | ICD-10-CM | POA: Diagnosis not present

## 2018-03-26 DIAGNOSIS — I10 Essential (primary) hypertension: Secondary | ICD-10-CM | POA: Diagnosis not present

## 2018-03-26 IMAGING — CT CT CERVICAL SPINE W/O CM
1 of 8 series · 2 of 33 positions shown, 3 images · non-contrast
Comparison: 09/28/2015 CT of the head and cervical spine.

CLINICAL DATA: 89 y/o  M; status post fall.

EXAM:
CT HEAD WITHOUT CONTRAST
CT CERVICAL SPINE WITHOUT CONTRAST
TECHNIQUE: Multidetector CT imaging of the head and cervical spine was
performed following the standard protocol without intravenous
contrast. Multiplanar CT image reconstructions of the cervical spine
were also generated.

[Series 307: axial · axial · 0.25mm/px · z∈[+716,+764]mm · 2 of 78 slices shown, 3 images]
[im 26/78  soft-tissue]
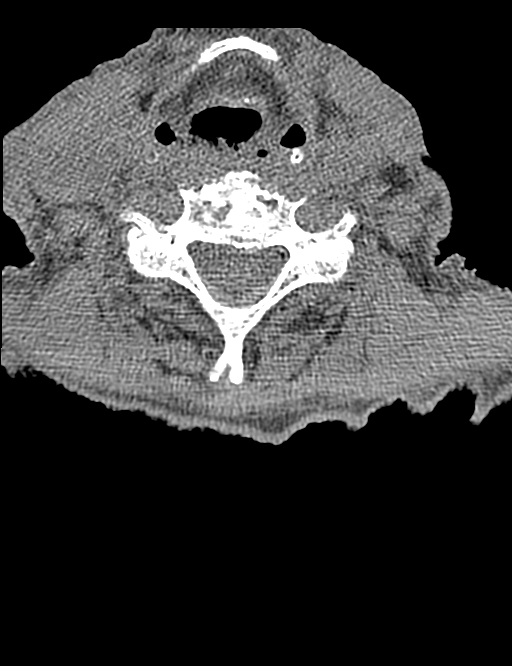
[im 26/78  bone]
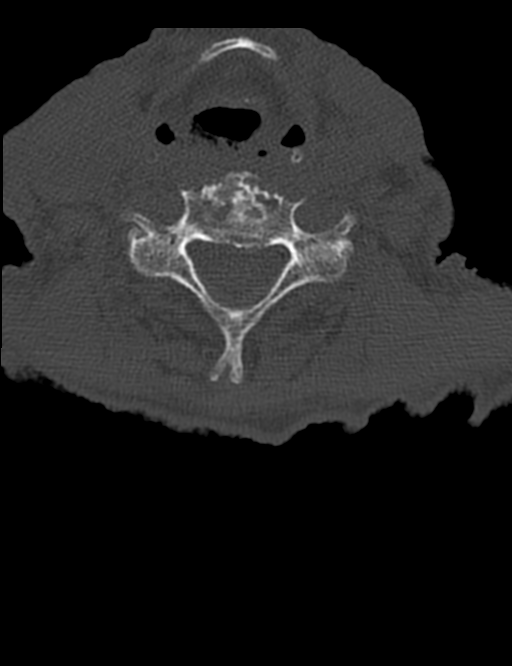
[im 52/78  bone]
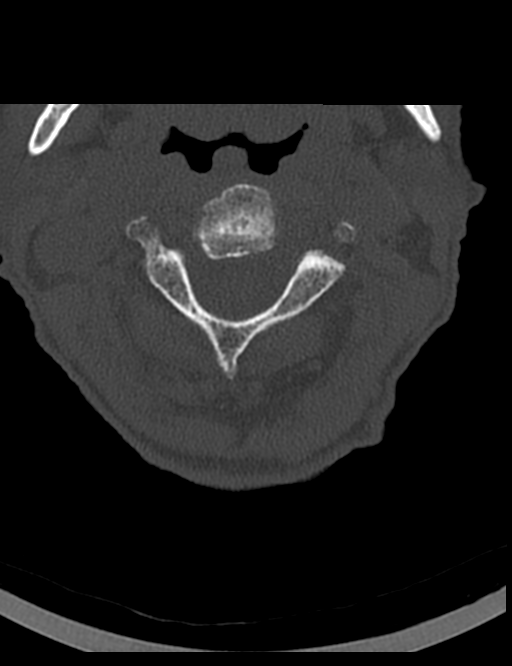

[2 of 33 positions shown; findings below may reference images not displayed]

FINDINGS: CT HEAD FINDINGS

Brain: Chronic infarct within the right cerebellar hemisphere.
Background of mild parenchymal volume loss and chronic microvascular
ischemic changes. No evidence of large acute infarct, hemorrhage,
hydrocephalus, extra-axial collection, or mass lesion.

Vascular: Extensive calcific atherosclerosis of the carotid siphons.
No hyperdense vessel.

Skull: Left frontal scalp 5 x 25 mm lipoma. High posterior midline
parietal scalp hematoma and laceration.

Sinuses/Orbits: Mild mucosal thickening of the anterior ethmoid air
cells. Underpneumatized frontal sinuses. Otherwise the visualized
paranasal sinuses and mastoid air cells are normally aerated.
Bilateral intra-ocular lens replacement.

Other: None.

CT CERVICAL SPINE FINDINGS

Alignment: Normal.

Skull base and vertebrae: No acute fracture. No primary bone lesion
or focal pathologic process.

Soft tissues and spinal canal: No prevertebral fluid or swelling. No
visible canal hematoma.

Disc levels: Multilevel cervical spondylosis greatest from C5
through C7 with there is disc space narrowing and endplate
degenerative changes. There is multilevel uncovertebral and facet
hypertrophy with mild to moderate foraminal narrowing. No high-grade
bony canal stenosis or foraminal narrowing is identified.

Upper chest: Clear appear

Other: Moderate calcific atherosclerosis of carotid bifurcations
bilaterally. Normal thyroid gland. No definite cervical adenopathy
or mass is identified on this noncontrast examination.
IMPRESSION: 1. No acute intracranial abnormality is identified. Stable chronic
microvascular ischemic changes and chronic right cerebellar infarct.
2. High posterior midline parietal scalp hematoma and laceration. No
calvarial fracture.
3. No acute fracture or malalignment of the cervical spine.

By: Jhemboy Padam M.D.

## 2018-03-29 DIAGNOSIS — R339 Retention of urine, unspecified: Secondary | ICD-10-CM | POA: Diagnosis not present

## 2018-03-29 DIAGNOSIS — Z48816 Encounter for surgical aftercare following surgery on the genitourinary system: Secondary | ICD-10-CM | POA: Diagnosis not present

## 2018-03-29 DIAGNOSIS — I10 Essential (primary) hypertension: Secondary | ICD-10-CM | POA: Diagnosis not present

## 2018-03-29 DIAGNOSIS — N3942 Incontinence without sensory awareness: Secondary | ICD-10-CM | POA: Diagnosis not present

## 2018-03-29 DIAGNOSIS — I2510 Atherosclerotic heart disease of native coronary artery without angina pectoris: Secondary | ICD-10-CM | POA: Diagnosis not present

## 2018-03-29 DIAGNOSIS — F419 Anxiety disorder, unspecified: Secondary | ICD-10-CM | POA: Diagnosis not present

## 2018-03-30 ENCOUNTER — Ambulatory Visit (INDEPENDENT_AMBULATORY_CARE_PROVIDER_SITE_OTHER): Payer: Medicare Other | Admitting: Internal Medicine

## 2018-03-30 DIAGNOSIS — Z5181 Encounter for therapeutic drug level monitoring: Secondary | ICD-10-CM

## 2018-03-30 DIAGNOSIS — R339 Retention of urine, unspecified: Secondary | ICD-10-CM | POA: Diagnosis not present

## 2018-03-30 DIAGNOSIS — I482 Chronic atrial fibrillation: Secondary | ICD-10-CM

## 2018-03-30 DIAGNOSIS — F419 Anxiety disorder, unspecified: Secondary | ICD-10-CM | POA: Diagnosis not present

## 2018-03-30 DIAGNOSIS — I2510 Atherosclerotic heart disease of native coronary artery without angina pectoris: Secondary | ICD-10-CM | POA: Diagnosis not present

## 2018-03-30 DIAGNOSIS — N3942 Incontinence without sensory awareness: Secondary | ICD-10-CM | POA: Diagnosis not present

## 2018-03-30 DIAGNOSIS — I4821 Permanent atrial fibrillation: Secondary | ICD-10-CM

## 2018-03-30 DIAGNOSIS — I10 Essential (primary) hypertension: Secondary | ICD-10-CM | POA: Diagnosis not present

## 2018-03-30 DIAGNOSIS — Z48816 Encounter for surgical aftercare following surgery on the genitourinary system: Secondary | ICD-10-CM | POA: Diagnosis not present

## 2018-03-30 LAB — POCT INR: INR: 2.1 (ref 2.0–3.0)

## 2018-03-31 ENCOUNTER — Ambulatory Visit (INDEPENDENT_AMBULATORY_CARE_PROVIDER_SITE_OTHER): Payer: Medicare Other | Admitting: *Deleted

## 2018-03-31 DIAGNOSIS — I495 Sick sinus syndrome: Secondary | ICD-10-CM | POA: Diagnosis not present

## 2018-03-31 NOTE — Progress Notes (Signed)
Remote pacemaker transmission.   

## 2018-04-02 DIAGNOSIS — I2510 Atherosclerotic heart disease of native coronary artery without angina pectoris: Secondary | ICD-10-CM | POA: Diagnosis not present

## 2018-04-02 DIAGNOSIS — F419 Anxiety disorder, unspecified: Secondary | ICD-10-CM | POA: Diagnosis not present

## 2018-04-02 DIAGNOSIS — Z48816 Encounter for surgical aftercare following surgery on the genitourinary system: Secondary | ICD-10-CM | POA: Diagnosis not present

## 2018-04-02 DIAGNOSIS — I10 Essential (primary) hypertension: Secondary | ICD-10-CM | POA: Diagnosis not present

## 2018-04-02 DIAGNOSIS — N3942 Incontinence without sensory awareness: Secondary | ICD-10-CM | POA: Diagnosis not present

## 2018-04-02 DIAGNOSIS — R339 Retention of urine, unspecified: Secondary | ICD-10-CM | POA: Diagnosis not present

## 2018-04-05 DIAGNOSIS — I10 Essential (primary) hypertension: Secondary | ICD-10-CM | POA: Diagnosis not present

## 2018-04-05 DIAGNOSIS — Z48816 Encounter for surgical aftercare following surgery on the genitourinary system: Secondary | ICD-10-CM | POA: Diagnosis not present

## 2018-04-05 DIAGNOSIS — N3942 Incontinence without sensory awareness: Secondary | ICD-10-CM | POA: Diagnosis not present

## 2018-04-05 DIAGNOSIS — R339 Retention of urine, unspecified: Secondary | ICD-10-CM | POA: Diagnosis not present

## 2018-04-05 DIAGNOSIS — F419 Anxiety disorder, unspecified: Secondary | ICD-10-CM | POA: Diagnosis not present

## 2018-04-05 DIAGNOSIS — I2510 Atherosclerotic heart disease of native coronary artery without angina pectoris: Secondary | ICD-10-CM | POA: Diagnosis not present

## 2018-04-06 ENCOUNTER — Ambulatory Visit (INDEPENDENT_AMBULATORY_CARE_PROVIDER_SITE_OTHER): Payer: Medicare Other | Admitting: Cardiovascular Disease

## 2018-04-06 DIAGNOSIS — Z48816 Encounter for surgical aftercare following surgery on the genitourinary system: Secondary | ICD-10-CM | POA: Diagnosis not present

## 2018-04-06 DIAGNOSIS — Z5181 Encounter for therapeutic drug level monitoring: Secondary | ICD-10-CM | POA: Diagnosis not present

## 2018-04-06 DIAGNOSIS — I482 Chronic atrial fibrillation: Secondary | ICD-10-CM

## 2018-04-06 DIAGNOSIS — F419 Anxiety disorder, unspecified: Secondary | ICD-10-CM | POA: Diagnosis not present

## 2018-04-06 DIAGNOSIS — R339 Retention of urine, unspecified: Secondary | ICD-10-CM | POA: Diagnosis not present

## 2018-04-06 DIAGNOSIS — I4821 Permanent atrial fibrillation: Secondary | ICD-10-CM

## 2018-04-06 DIAGNOSIS — I2510 Atherosclerotic heart disease of native coronary artery without angina pectoris: Secondary | ICD-10-CM | POA: Diagnosis not present

## 2018-04-06 DIAGNOSIS — N3942 Incontinence without sensory awareness: Secondary | ICD-10-CM | POA: Diagnosis not present

## 2018-04-06 DIAGNOSIS — I10 Essential (primary) hypertension: Secondary | ICD-10-CM | POA: Diagnosis not present

## 2018-04-06 LAB — POCT INR: INR: 2.4 (ref 2.0–3.0)

## 2018-04-06 NOTE — Patient Instructions (Signed)
Description   Spoke with [facility] RN while in the home and instructed pt to continue taking 1 tablet daily except 1.5 tablets on Tuesdays and  Saturdays.  Recheck INR in 2 weeks.  Call us with any concerns or medication changes # [phone] [facility], Main # [phone].

## 2018-04-07 DIAGNOSIS — I10 Essential (primary) hypertension: Secondary | ICD-10-CM | POA: Diagnosis not present

## 2018-04-07 DIAGNOSIS — R339 Retention of urine, unspecified: Secondary | ICD-10-CM | POA: Diagnosis not present

## 2018-04-07 DIAGNOSIS — F419 Anxiety disorder, unspecified: Secondary | ICD-10-CM | POA: Diagnosis not present

## 2018-04-07 DIAGNOSIS — N3942 Incontinence without sensory awareness: Secondary | ICD-10-CM | POA: Diagnosis not present

## 2018-04-07 DIAGNOSIS — I2510 Atherosclerotic heart disease of native coronary artery without angina pectoris: Secondary | ICD-10-CM | POA: Diagnosis not present

## 2018-04-07 DIAGNOSIS — Z48816 Encounter for surgical aftercare following surgery on the genitourinary system: Secondary | ICD-10-CM | POA: Diagnosis not present

## 2018-04-09 ENCOUNTER — Encounter: Payer: Self-pay | Admitting: Interventional Cardiology

## 2018-04-13 DIAGNOSIS — N3942 Incontinence without sensory awareness: Secondary | ICD-10-CM | POA: Diagnosis not present

## 2018-04-13 DIAGNOSIS — I10 Essential (primary) hypertension: Secondary | ICD-10-CM | POA: Diagnosis not present

## 2018-04-13 DIAGNOSIS — Z48816 Encounter for surgical aftercare following surgery on the genitourinary system: Secondary | ICD-10-CM | POA: Diagnosis not present

## 2018-04-13 DIAGNOSIS — F419 Anxiety disorder, unspecified: Secondary | ICD-10-CM | POA: Diagnosis not present

## 2018-04-13 DIAGNOSIS — R339 Retention of urine, unspecified: Secondary | ICD-10-CM | POA: Diagnosis not present

## 2018-04-13 DIAGNOSIS — I2510 Atherosclerotic heart disease of native coronary artery without angina pectoris: Secondary | ICD-10-CM | POA: Diagnosis not present

## 2018-04-14 DIAGNOSIS — I2510 Atherosclerotic heart disease of native coronary artery without angina pectoris: Secondary | ICD-10-CM | POA: Diagnosis not present

## 2018-04-14 DIAGNOSIS — I10 Essential (primary) hypertension: Secondary | ICD-10-CM | POA: Diagnosis not present

## 2018-04-14 DIAGNOSIS — N3942 Incontinence without sensory awareness: Secondary | ICD-10-CM | POA: Diagnosis not present

## 2018-04-14 DIAGNOSIS — R339 Retention of urine, unspecified: Secondary | ICD-10-CM | POA: Diagnosis not present

## 2018-04-14 DIAGNOSIS — Z48816 Encounter for surgical aftercare following surgery on the genitourinary system: Secondary | ICD-10-CM | POA: Diagnosis not present

## 2018-04-14 DIAGNOSIS — F419 Anxiety disorder, unspecified: Secondary | ICD-10-CM | POA: Diagnosis not present

## 2018-04-15 ENCOUNTER — Ambulatory Visit (HOSPITAL_COMMUNITY)
Admission: RE | Admit: 2018-04-15 | Discharge: 2018-04-15 | Disposition: A | Discharge status: Home / Self Care | Payer: Medicare Other | Source: Ambulatory Visit | Attending: Urology | Admitting: Urology

## 2018-04-15 DIAGNOSIS — Z466 Encounter for fitting and adjustment of urinary device: Secondary | ICD-10-CM | POA: Diagnosis not present

## 2018-04-15 DIAGNOSIS — Y838 Other surgical procedures as the cause of abnormal reaction of the patient, or of later complication, without mention of misadventure at the time of the procedure: Secondary | ICD-10-CM | POA: Diagnosis not present

## 2018-04-15 DIAGNOSIS — T83113A Breakdown (mechanical) of other urinary stents, initial encounter: Secondary | ICD-10-CM

## 2018-04-15 DIAGNOSIS — Z9079 Acquired absence of other genital organ(s): Secondary | ICD-10-CM | POA: Insufficient documentation

## 2018-04-15 DIAGNOSIS — T83111D Breakdown (mechanical) of urinary sphincter implant, subsequent encounter: Secondary | ICD-10-CM | POA: Insufficient documentation

## 2018-04-15 MED ORDER — IOTHALAMATE MEGLUMINE 17.2 % UR SOLN
250.0000 mL | Freq: Once | URETHRAL | Status: AC | PRN
  Administered 2018-04-15: 60 mL via URETHRAL

## 2018-04-20 ENCOUNTER — Ambulatory Visit (INDEPENDENT_AMBULATORY_CARE_PROVIDER_SITE_OTHER): Payer: Medicare Other

## 2018-04-20 DIAGNOSIS — I2510 Atherosclerotic heart disease of native coronary artery without angina pectoris: Secondary | ICD-10-CM | POA: Diagnosis not present

## 2018-04-20 DIAGNOSIS — I10 Essential (primary) hypertension: Secondary | ICD-10-CM | POA: Diagnosis not present

## 2018-04-20 DIAGNOSIS — F419 Anxiety disorder, unspecified: Secondary | ICD-10-CM | POA: Diagnosis not present

## 2018-04-20 DIAGNOSIS — N3942 Incontinence without sensory awareness: Secondary | ICD-10-CM | POA: Diagnosis not present

## 2018-04-20 DIAGNOSIS — I482 Chronic atrial fibrillation: Secondary | ICD-10-CM | POA: Diagnosis not present

## 2018-04-20 DIAGNOSIS — Z5181 Encounter for therapeutic drug level monitoring: Secondary | ICD-10-CM

## 2018-04-20 DIAGNOSIS — Z48816 Encounter for surgical aftercare following surgery on the genitourinary system: Secondary | ICD-10-CM | POA: Diagnosis not present

## 2018-04-20 DIAGNOSIS — R339 Retention of urine, unspecified: Secondary | ICD-10-CM | POA: Diagnosis not present

## 2018-04-20 DIAGNOSIS — I4821 Permanent atrial fibrillation: Secondary | ICD-10-CM

## 2018-04-20 LAB — POCT INR: INR: 2.5 (ref 2.0–3.0)

## 2018-04-20 NOTE — Patient Instructions (Signed)
Description   Spoke with Richardean Canal [facility] while in the home and instructed pt to continue taking 1 tablet daily except 1.5 tablets on Tuesdays and Saturdays.  Recheck INR in 3 weeks.  They may be discharging pt prior to recheck, they will notify our office if pt is discharged so that pt can be made follow-up appt here in our office. Call us with any concerns or medication changes # [phone] [facility], Main # [phone].

## 2018-04-28 DIAGNOSIS — N3942 Incontinence without sensory awareness: Secondary | ICD-10-CM | POA: Diagnosis not present

## 2018-04-28 DIAGNOSIS — I2510 Atherosclerotic heart disease of native coronary artery without angina pectoris: Secondary | ICD-10-CM | POA: Diagnosis not present

## 2018-04-28 DIAGNOSIS — R339 Retention of urine, unspecified: Secondary | ICD-10-CM | POA: Diagnosis not present

## 2018-04-28 DIAGNOSIS — F419 Anxiety disorder, unspecified: Secondary | ICD-10-CM | POA: Diagnosis not present

## 2018-04-28 DIAGNOSIS — Z48816 Encounter for surgical aftercare following surgery on the genitourinary system: Secondary | ICD-10-CM | POA: Diagnosis not present

## 2018-04-28 DIAGNOSIS — I10 Essential (primary) hypertension: Secondary | ICD-10-CM | POA: Diagnosis not present

## 2018-05-05 LAB — CUP PACEART REMOTE DEVICE CHECK
Battery Remaining Longevity: 84 mo
Brady Statistic RA Percent Paced: 0 %
Brady Statistic RV Percent Paced: 93 %
Implantable Lead Implant Date: 20130917
Implantable Lead Location: 753859
Implantable Lead Model: 4456
Implantable Lead Model: 4469
Implantable Lead Serial Number: 448722
Implantable Lead Serial Number: 484382
Lead Channel Setting Sensing Sensitivity: 2.5 mV
MDC IDC LEAD IMPLANT DT: 20130917
MDC IDC LEAD LOCATION: 753860
MDC IDC MSMT BATTERY REMAINING PERCENTAGE: 86 %
MDC IDC MSMT LEADCHNL RA IMPEDANCE VALUE: 392 Ohm
MDC IDC MSMT LEADCHNL RV IMPEDANCE VALUE: 398 Ohm
MDC IDC PG IMPLANT DT: 20130917
MDC IDC PG SERIAL: 111635
MDC IDC SESS DTM: 20190710044100
MDC IDC SET LEADCHNL RV PACING AMPLITUDE: 2.4 V
MDC IDC SET LEADCHNL RV PACING PULSEWIDTH: 0.4 ms

## 2018-05-06 DIAGNOSIS — R339 Retention of urine, unspecified: Secondary | ICD-10-CM | POA: Diagnosis not present

## 2018-05-06 DIAGNOSIS — Z48816 Encounter for surgical aftercare following surgery on the genitourinary system: Secondary | ICD-10-CM | POA: Diagnosis not present

## 2018-05-06 DIAGNOSIS — I10 Essential (primary) hypertension: Secondary | ICD-10-CM | POA: Diagnosis not present

## 2018-05-06 DIAGNOSIS — F419 Anxiety disorder, unspecified: Secondary | ICD-10-CM | POA: Diagnosis not present

## 2018-05-06 DIAGNOSIS — I2510 Atherosclerotic heart disease of native coronary artery without angina pectoris: Secondary | ICD-10-CM | POA: Diagnosis not present

## 2018-05-06 DIAGNOSIS — N3942 Incontinence without sensory awareness: Secondary | ICD-10-CM | POA: Diagnosis not present

## 2018-05-11 ENCOUNTER — Ambulatory Visit (INDEPENDENT_AMBULATORY_CARE_PROVIDER_SITE_OTHER): Payer: Medicare Other | Admitting: Interventional Cardiology

## 2018-05-11 DIAGNOSIS — I4821 Permanent atrial fibrillation: Secondary | ICD-10-CM

## 2018-05-11 DIAGNOSIS — I482 Chronic atrial fibrillation: Secondary | ICD-10-CM

## 2018-05-11 DIAGNOSIS — I2510 Atherosclerotic heart disease of native coronary artery without angina pectoris: Secondary | ICD-10-CM | POA: Diagnosis not present

## 2018-05-11 DIAGNOSIS — R339 Retention of urine, unspecified: Secondary | ICD-10-CM | POA: Diagnosis not present

## 2018-05-11 DIAGNOSIS — Z5181 Encounter for therapeutic drug level monitoring: Secondary | ICD-10-CM | POA: Diagnosis not present

## 2018-05-11 DIAGNOSIS — I10 Essential (primary) hypertension: Secondary | ICD-10-CM | POA: Diagnosis not present

## 2018-05-11 DIAGNOSIS — N3942 Incontinence without sensory awareness: Secondary | ICD-10-CM | POA: Diagnosis not present

## 2018-05-11 DIAGNOSIS — F419 Anxiety disorder, unspecified: Secondary | ICD-10-CM | POA: Diagnosis not present

## 2018-05-11 DIAGNOSIS — Z48816 Encounter for surgical aftercare following surgery on the genitourinary system: Secondary | ICD-10-CM | POA: Diagnosis not present

## 2018-05-11 LAB — POCT INR: INR: 2.6 (ref 2.0–3.0)

## 2018-05-14 DIAGNOSIS — Z961 Presence of intraocular lens: Secondary | ICD-10-CM | POA: Diagnosis not present

## 2018-06-02 DIAGNOSIS — D225 Melanocytic nevi of trunk: Secondary | ICD-10-CM | POA: Diagnosis not present

## 2018-06-02 DIAGNOSIS — C44222 Squamous cell carcinoma of skin of right ear and external auricular canal: Secondary | ICD-10-CM | POA: Diagnosis not present

## 2018-06-02 DIAGNOSIS — L821 Other seborrheic keratosis: Secondary | ICD-10-CM | POA: Diagnosis not present

## 2018-06-02 DIAGNOSIS — D1801 Hemangioma of skin and subcutaneous tissue: Secondary | ICD-10-CM | POA: Diagnosis not present

## 2018-06-02 DIAGNOSIS — Z85828 Personal history of other malignant neoplasm of skin: Secondary | ICD-10-CM | POA: Diagnosis not present

## 2018-06-02 DIAGNOSIS — C44329 Squamous cell carcinoma of skin of other parts of face: Secondary | ICD-10-CM | POA: Diagnosis not present

## 2018-06-02 DIAGNOSIS — L814 Other melanin hyperpigmentation: Secondary | ICD-10-CM | POA: Diagnosis not present

## 2018-06-02 DIAGNOSIS — D485 Neoplasm of uncertain behavior of skin: Secondary | ICD-10-CM | POA: Diagnosis not present

## 2018-06-09 ENCOUNTER — Ambulatory Visit (INDEPENDENT_AMBULATORY_CARE_PROVIDER_SITE_OTHER): Payer: Medicare Other | Admitting: *Deleted

## 2018-06-09 DIAGNOSIS — I4891 Unspecified atrial fibrillation: Secondary | ICD-10-CM | POA: Diagnosis not present

## 2018-06-09 DIAGNOSIS — I482 Chronic atrial fibrillation: Secondary | ICD-10-CM | POA: Diagnosis not present

## 2018-06-09 DIAGNOSIS — Z7901 Long term (current) use of anticoagulants: Secondary | ICD-10-CM

## 2018-06-09 DIAGNOSIS — Z5181 Encounter for therapeutic drug level monitoring: Secondary | ICD-10-CM | POA: Diagnosis not present

## 2018-06-09 DIAGNOSIS — I4821 Permanent atrial fibrillation: Secondary | ICD-10-CM

## 2018-06-09 LAB — POCT INR: INR: 2.7 (ref 2.0–3.0)

## 2018-06-09 NOTE — Patient Instructions (Signed)
Description   Continue taking 1 tablet daily except 1.5 tablets on Tuesdays and Saturdays.  Recheck INR in 5 weeks.  Call us with any concerns or medication changes # [phone] [facility], Main # [phone].

## 2018-06-17 DIAGNOSIS — D631 Anemia in chronic kidney disease: Secondary | ICD-10-CM | POA: Diagnosis not present

## 2018-06-17 DIAGNOSIS — E871 Hypo-osmolality and hyponatremia: Secondary | ICD-10-CM | POA: Diagnosis not present

## 2018-06-17 DIAGNOSIS — D649 Anemia, unspecified: Secondary | ICD-10-CM | POA: Diagnosis not present

## 2018-06-17 DIAGNOSIS — I340 Nonrheumatic mitral (valve) insufficiency: Secondary | ICD-10-CM | POA: Diagnosis not present

## 2018-06-17 DIAGNOSIS — N183 Chronic kidney disease, stage 3 (moderate): Secondary | ICD-10-CM | POA: Diagnosis not present

## 2018-06-17 DIAGNOSIS — Z7901 Long term (current) use of anticoagulants: Secondary | ICD-10-CM | POA: Diagnosis not present

## 2018-06-17 DIAGNOSIS — Z681 Body mass index (BMI) 19 or less, adult: Secondary | ICD-10-CM | POA: Diagnosis not present

## 2018-06-17 DIAGNOSIS — E7800 Pure hypercholesterolemia, unspecified: Secondary | ICD-10-CM | POA: Diagnosis not present

## 2018-06-17 DIAGNOSIS — Z66 Do not resuscitate: Secondary | ICD-10-CM | POA: Diagnosis not present

## 2018-06-17 DIAGNOSIS — F0390 Unspecified dementia without behavioral disturbance: Secondary | ICD-10-CM | POA: Diagnosis not present

## 2018-06-17 DIAGNOSIS — I10 Essential (primary) hypertension: Secondary | ICD-10-CM | POA: Diagnosis not present

## 2018-06-17 DIAGNOSIS — R4182 Altered mental status, unspecified: Secondary | ICD-10-CM | POA: Diagnosis not present

## 2018-06-30 ENCOUNTER — Ambulatory Visit (INDEPENDENT_AMBULATORY_CARE_PROVIDER_SITE_OTHER): Payer: Medicare Other | Admitting: *Deleted

## 2018-06-30 DIAGNOSIS — I495 Sick sinus syndrome: Secondary | ICD-10-CM

## 2018-07-01 ENCOUNTER — Encounter: Payer: Medicare Other | Admitting: Nurse Practitioner

## 2018-07-01 DIAGNOSIS — C44329 Squamous cell carcinoma of skin of other parts of face: Secondary | ICD-10-CM | POA: Diagnosis not present

## 2018-07-01 NOTE — Progress Notes (Signed)
Remote pacemaker transmission.   

## 2018-07-08 NOTE — Progress Notes (Signed)
Electrophysiology Office Note Date: 07/14/2018  ID:  Eddie Park, DOB 27-Sep-1926, MRN [id number]  PCP: Leighton Ruff, MD Primary Cardiologist: Irish Lack Electrophysiologist: Allred  CC: Pacemaker follow-up  Eddie Park is a 84 y.o. male seen today for Dr Rayann Heman.  He presents today for routine electrophysiology followup.  Since last being seen in our clinic, the patient reports doing relatively well. He is seen today with his wife.  He denies chest pain, palpitations, dyspnea, PND, orthopnea, nausea, vomiting, dizziness, syncope, edema, weight gain, or early satiety. He reports arm pain that occurs when he lifts his arm over his head. This has been going on for 2 years.   Device History: BSX dual chamber PPM implanted 2013 for tachy/brady syndrome   Past Medical History:  Diagnosis Date  . Anticoagulated on Coumadin   . Anxiety   . Aortic insufficiency    a. mod-severe by echo 2014. b. mod by echo 2017.  [name] Chronic anemia   . Chronic combined systolic and diastolic CHF (congestive heart failure) ([place])   . CKD (chronic kidney disease), stage III ([place])   . Coronary artery disease    a. s/p MI 1995.  [name] Hard of hearing   . Hypertension   . Hyponatremia   . Mitral regurgitation    a. prev severe, more recently mild in 2017 -> not felt to be candidate for any kind of surgical repair.  [name] Permanent atrial fibrillation   . Prostate cancer ([place]) 2002  . Seizure disorder ([place])   . Tachycardia-bradycardia syndrome ([place])    a. w/ sycope - initial implant 2007, last gen change 2013 with BSX dual chamber PPM.   Past Surgical History:  Procedure Laterality Date  . artificial urinary sphincter    . CYSTOSCOPY N/A 03/07/2018   Procedure: CYSTOSCOPY FLEXIBLE;  Surgeon: Raynelle Bring, MD;  Location: [facility];  Service: Urology;  Laterality: N/A;  . HERNIA REPAIR  2002  . PACEMAKER INSERTION  04/29/06   Generator change (BSc) by Dr Rayann Heman 06/21/12  . PERMANENT PACEMAKER GENERATOR  CHANGE N/A 06/08/2012   Procedure: PERMANENT PACEMAKER GENERATOR CHANGE;  Surgeon: Thompson Grayer, MD;  Location: [facility] CATH LAB;  Service: Cardiovascular;  Laterality: N/A;  . PROSTATE SURGERY    . PROSTATECTOMY  2002  . URINARY SPHINCTER IMPLANT N/A 03/07/2018   Procedure: ARTIFICIAL URINARY SPHINCTER REMOVAL;  Surgeon: Raynelle Bring, MD;  Location: [facility];  Service: Urology;  Laterality: N/A;    Current Outpatient Medications  Medication Sig Dispense Refill  . cholecalciferol (VITAMIN D) 1000 UNITS tablet Take 2,000 Units by mouth daily.    . fish oil-omega-3 fatty acids 1000 MG capsule Take 2 g by mouth daily.    [name] JANTOVEN 5 MG tablet TAKE AS DIRECTED BY COUMADIN CLINIC (Patient taking differently: TAKE 1 TABLET (5 MG) BY MOUTH DAILY AS DIRECTED BY COUMADIN CLINIC) 150 tablet 1  . levETIRAcetam (KEPPRA) 500 MG tablet Take 1 tablet (500 mg total) by mouth 2 (two) times daily. 180 tablet 3  . Multiple Vitamin (MULTIVITAMIN WITH MINERALS) TABS Take 1 tablet by mouth daily.    . sertraline (ZOLOFT) 100 MG tablet Take 100 mg by mouth daily.    [name] torsemide (DEMADEX) 20 MG tablet Take 1 tablet (20 mg total) by mouth daily. 90 tablet 1  . vitamin B-12 (CYANOCOBALAMIN) 1000 MCG tablet Take 1,000 mcg by mouth every other day.      No current facility-administered medications for this visit.     Allergies:  Patient has no known allergies.   Social History: Social History   Socioeconomic History  . Marital status: Married    Spouse name: Not on file  . Number of children: 2  . Years of education: 8th grade  . Highest education level: Not on file  Occupational History  . Occupation: Retired  Scientific laboratory technician  . Financial resource strain: Not on file  . Food insecurity:    Worry: Not on file    Inability: Not on file  . Transportation needs:    Medical: Not on file    Non-medical: Not on file  Tobacco Use  . Smoking status: Never Smoker  . Smokeless tobacco: Never Used  Substance and  Sexual Activity  . Alcohol use: No  . Drug use: No  . Sexual activity: Not on file  Lifestyle  . Physical activity:    Days per week: Not on file    Minutes per session: Not on file  . Stress: Not on file  Relationships  . Social connections:    Talks on phone: Not on file    Gets together: Not on file    Attends religious service: Not on file    Active member of club or organization: Not on file    Attends meetings of clubs or organizations: Not on file    Relationship status: Not on file  . Intimate partner violence:    Fear of current or ex partner: Not on file    Emotionally abused: Not on file    Physically abused: Not on file    Forced sexual activity: Not on file  Other Topics Concern  . Not on file  Social History Narrative   Lives in [place] with his wife.   Right-handed.   Retired Furniture conservator/restorer.   No caffeine use.    Family History: Family History  Problem Relation Age of Onset  . Colon polyps Brother   . Heart disease Mother   . Heart attack Mother   . Heart attack Father   . Heart disease Father   . Diabetes Father        Also, three of his brothers had diabetes.  [name] Heart attack Brother      Review of Systems: All other systems reviewed and are otherwise negative except as noted above.   Physical Exam: VS:  BP (!) 102/40   Park 68   Ht 5\' 6"  (1.676 m)   Wt 100 lb (45.4 kg)   BMI 16.14 kg/m  , BMI Body mass index is 16.14 kg/m.  GEN- The patient is elderly, frail, and chronically ill appearing, alert and oriented x 3 today.   HEENT: normocephalic, atraumatic; sclera clear, conjunctiva pink; hearing intact; oropharynx clear; neck supple  Lungs- Clear to ausculation bilaterally, normal work of breathing.  No wheezes, rales, rhonchi Heart- Regular rate and rhythm (paced) GI- soft, non-tender, non-distended, bowel sounds present  Extremities- no clubbing, cyanosis  MS- no significant deformity or atrophy Skin- warm and dry, no rash or lesion;  PPM pocket well healed, +significant atrophy around device, no erosion or threatened erosion noted. Psych- euthymic mood, full affect Neuro- strength and sensation are intact  PPM Interrogation- reviewed in detail today,  See PACEART report  EKG:  EKG is not ordered today.  Recent Labs: 03/07/2018: BUN 47; Creatinine, Ser 1.44; Potassium 3.4; Sodium 140 03/08/2018: Hemoglobin 7.6; Platelets 154   Wt Readings from Last 3 Encounters:  07/14/18 100 lb (45.4 kg)  03/05/18 95 lb 10.9 oz (  43.4 kg)  02/02/18 104 lb 1.6 oz (47.2 kg)     Other studies Reviewed: Additional studies/ records that were reviewed today include: Dr Rayann Heman and Dr Hassell Done office notes  Assessment and Plan:  1.  Tachy/brady syndrome Normal PPM function See Pace Art report No changes today  2.  Permanent atrial fibrillation V rates controlled Continue Warfarin for CHADS2VASC of 4  3.  Chronic diastolic heart failure/LE edema Continue current therapy  4.  Severe MR He has not been interested in intervention with lack of symptoms  5.  HTN Stable No change required today   Current medicines are reviewed at length with the patient today.   The patient does not have concerns regarding his medicines.  The following changes were made today:  none  Labs/ tests ordered today include: none Orders Placed This Encounter  Procedures  . CUP PACEART INCLINIC DEVICE CHECK     Disposition:   Follow up with [facility], Dr Irish Lack as scheduled, me in 1 year     Signed, Chanetta Marshall, NP 07/14/2018 9:13 AM  [address] [place] [address] [phone] (office) [phone] (fax)

## 2018-07-14 ENCOUNTER — Ambulatory Visit (INDEPENDENT_AMBULATORY_CARE_PROVIDER_SITE_OTHER): Payer: Medicare Other | Admitting: *Deleted

## 2018-07-14 ENCOUNTER — Ambulatory Visit (INDEPENDENT_AMBULATORY_CARE_PROVIDER_SITE_OTHER): Payer: Medicare Other | Admitting: Nurse Practitioner

## 2018-07-14 ENCOUNTER — Encounter: Payer: Self-pay | Admitting: Nurse Practitioner

## 2018-07-14 VITALS — BP 102/40 | HR 68 | Ht 66.0 in | Wt 100.0 lb

## 2018-07-14 DIAGNOSIS — I495 Sick sinus syndrome: Secondary | ICD-10-CM

## 2018-07-14 DIAGNOSIS — Z7901 Long term (current) use of anticoagulants: Secondary | ICD-10-CM

## 2018-07-14 DIAGNOSIS — I10 Essential (primary) hypertension: Secondary | ICD-10-CM | POA: Diagnosis not present

## 2018-07-14 DIAGNOSIS — I5032 Chronic diastolic (congestive) heart failure: Secondary | ICD-10-CM

## 2018-07-14 DIAGNOSIS — I4821 Permanent atrial fibrillation: Secondary | ICD-10-CM

## 2018-07-14 DIAGNOSIS — I4891 Unspecified atrial fibrillation: Secondary | ICD-10-CM

## 2018-07-14 DIAGNOSIS — Z5181 Encounter for therapeutic drug level monitoring: Secondary | ICD-10-CM | POA: Diagnosis not present

## 2018-07-14 DIAGNOSIS — I340 Nonrheumatic mitral (valve) insufficiency: Secondary | ICD-10-CM | POA: Diagnosis not present

## 2018-07-14 LAB — POCT INR: INR: 2.8 (ref 2.0–3.0)

## 2018-07-14 LAB — CUP PACEART INCLINIC DEVICE CHECK
Implantable Lead Implant Date: 20130917
Implantable Lead Implant Date: 20130917
Implantable Lead Location: 753859
Implantable Lead Serial Number: 484382
MDC IDC LEAD LOCATION: 753860
MDC IDC LEAD SERIAL: 448722
MDC IDC PG IMPLANT DT: 20130917
MDC IDC SESS DTM: 20191023090355
Pulse Gen Serial Number: 111635

## 2018-07-14 NOTE — Patient Instructions (Addendum)
Medication Instructions:   Your physician recommends that you continue on your current medications as directed. Please refer to the Current Medication list given to you today.   If you need a refill on your cardiac medications before your next appointment, please call your pharmacy.  Labwork: NONE ORDERED  TODAY    Testing/Procedures: NONE ORDERED  TODAY    Follow-Up: Your physician wants you to follow-up in: [place] will receive a reminder letter in the mail two months in advance. If you don't receive a letter, please call our office to schedule the follow-up appointment.   Remote monitoring is used to monitor your Pacemaker of ICD from home. This monitoring reduces the number of office visits required to check your device to one time per year. It allows [place] to keep an eye on the functioning of your device to ensure it is working properly. You are scheduled for a device check from home on . 09-29-2018.Marland KitchenYou may send your transmission at any time that day. If you have a wireless device, the transmission will be sent automatically. After your physician reviews your transmission, you will receive a postcard with your next transmission date.     Any Other Special Instructions Will Be Listed Below (If Applicable).

## 2018-07-14 NOTE — Patient Instructions (Signed)
Description   Continue taking 1 tablet daily except 1.5 tablets on Tuesdays and Saturdays.  Recheck INR in 4 weeks.  Call us with any concerns or medication changes # [phone] [facility] Clinic, Main # [phone].

## 2018-07-20 LAB — CUP PACEART REMOTE DEVICE CHECK
Implantable Lead Implant Date: 20130917
Implantable Lead Location: 753859
Implantable Lead Model: 4469
Implantable Lead Serial Number: 484382
Implantable Pulse Generator Implant Date: 20130917
MDC IDC LEAD IMPLANT DT: 20130917
MDC IDC LEAD LOCATION: 753860
MDC IDC LEAD SERIAL: 448722
MDC IDC PG SERIAL: 111635
MDC IDC SESS DTM: 20191029081935

## 2018-07-22 DIAGNOSIS — Z23 Encounter for immunization: Secondary | ICD-10-CM | POA: Diagnosis not present

## 2018-07-22 DIAGNOSIS — D649 Anemia, unspecified: Secondary | ICD-10-CM | POA: Diagnosis not present

## 2018-07-23 DIAGNOSIS — T83111D Breakdown (mechanical) of urinary sphincter implant, subsequent encounter: Secondary | ICD-10-CM | POA: Diagnosis not present

## 2018-07-26 DIAGNOSIS — D649 Anemia, unspecified: Secondary | ICD-10-CM | POA: Diagnosis not present

## 2018-07-28 ENCOUNTER — Telehealth: Payer: Self-pay | Admitting: Oncology

## 2018-07-28 ENCOUNTER — Encounter: Payer: Self-pay | Admitting: Oncology

## 2018-07-28 NOTE — Telephone Encounter (Signed)
New referral received from Dr. Drema Dallas at [facility] for anemia. Barbara from Dr. Drema Dallas' office cld to schedule an appt. Pt has been scheduled to see Dr. Alen Blew on 11/12 at [facility] will notify the pt. Letter mailed.

## 2018-07-29 DIAGNOSIS — D649 Anemia, unspecified: Secondary | ICD-10-CM | POA: Diagnosis not present

## 2018-08-02 ENCOUNTER — Other Ambulatory Visit: Payer: Self-pay | Admitting: Interventional Cardiology

## 2018-08-03 ENCOUNTER — Telehealth: Payer: Self-pay | Admitting: Oncology

## 2018-08-03 ENCOUNTER — Inpatient Hospital Stay: Payer: Medicare Other | Attending: Oncology | Admitting: Oncology

## 2018-08-03 VITALS — BP 112/43 | HR 60 | Temp 97.5°F | Resp 17 | Ht 66.0 in | Wt 102.7 lb

## 2018-08-03 DIAGNOSIS — Z7901 Long term (current) use of anticoagulants: Secondary | ICD-10-CM

## 2018-08-03 DIAGNOSIS — N183 Chronic kidney disease, stage 3 (moderate): Secondary | ICD-10-CM | POA: Diagnosis not present

## 2018-08-03 DIAGNOSIS — D696 Thrombocytopenia, unspecified: Secondary | ICD-10-CM | POA: Diagnosis not present

## 2018-08-03 DIAGNOSIS — D72821 Monocytosis (symptomatic): Secondary | ICD-10-CM | POA: Diagnosis not present

## 2018-08-03 DIAGNOSIS — I4821 Permanent atrial fibrillation: Secondary | ICD-10-CM | POA: Diagnosis not present

## 2018-08-03 DIAGNOSIS — D72819 Decreased white blood cell count, unspecified: Secondary | ICD-10-CM | POA: Diagnosis not present

## 2018-08-03 DIAGNOSIS — Z79899 Other long term (current) drug therapy: Secondary | ICD-10-CM | POA: Diagnosis not present

## 2018-08-03 DIAGNOSIS — D649 Anemia, unspecified: Secondary | ICD-10-CM

## 2018-08-03 DIAGNOSIS — I10 Essential (primary) hypertension: Secondary | ICD-10-CM

## 2018-08-03 NOTE — Progress Notes (Signed)
Reason for the request:    Anemia  HPI: I was asked by Dr. Drema Dallas to evaluate Mr. Winther for the diagnosis of anemia.  He is an 84 year old man history with chronic renal insufficiency, chronic anticoagulation as well as hypertension.  He was hospitalized in June 2019 and underwent removal of artificial urinary sphincter after erosion of his artificial sphincter performed by Dr. Alinda Money.  During that period of time he has had blood loss related to his chronic anticoagulation.  He was noted to have IMA globin as low as 7.6 on March 08, 2018.  He had normal white cell count and platelet count at that time.  His hemoglobin have ranged between 9 and 11 since 2011.  CBC obtained on 07/27/2018 showed a hemoglobin of 8.8, white cell count of 2.9 and a platelet count of 109.  His CBC on July 22, 2018 showed a white cell count of 3.0, hemoglobin of 9.0 ferritin of 20.8.  His hemoglobin back in September 28 was 9.9 with a white cell count 3.6 and a platelet count of 132.  His creatinine was 1.49 with a creatinine clearance about 44 cc/min.  Likely, he is asymptomatic.  He is extremely hard of hearing and difficult to obtain any history and most of the history provided by his wife.  He denies any shortness of breath or dyspnea on exertion but does report some mild fatigue.  He denies any GI blood losses at this time.  He continues to ambulate without any major difficulties.  Worsening constipation and does use laxatives.  He does not report any headaches, blurry vision, syncope or seizures. Does not report any fevers, chills or sweats.  Does not report any cough, wheezing or hemoptysis.  Does not report any chest pain, palpitation, orthopnea or leg edema.  Does not report any nausea, vomiting or abdominal pain.  Does not report any skeletal complaints.    Does not report frequency, urgency or hematuria.  Does not report any skin rashes or lesions. Does not report any heat or cold intolerance.  Does not report any  lymphadenopathy or petechiae.  Does not report any anxiety or depression.  Remaining review of systems is negative.    Past Medical History:  Diagnosis Date  . Anticoagulated on Coumadin   . Anxiety   . Aortic insufficiency    a. mod-severe by echo 2014. b. mod by echo 2017.  [name] Chronic anemia   . Chronic combined systolic and diastolic CHF (congestive heart failure) ([place])   . CKD (chronic kidney disease), stage III ([place])   . Coronary artery disease    a. s/p MI 1995.  [name] Hard of hearing   . Hypertension   . Hyponatremia   . Mitral regurgitation    a. prev severe, more recently mild in 2017 -> not felt to be candidate for any kind of surgical repair.  [name] Permanent atrial fibrillation   . Prostate cancer ([place]) 2002  . Seizure disorder ([place])   . Tachycardia-bradycardia syndrome ([place])    a. w/ sycope - initial implant 2007, last gen change 2013 with BSX dual chamber PPM.  :  Past Surgical History:  Procedure Laterality Date  . artificial urinary sphincter    . CYSTOSCOPY N/A 03/07/2018   Procedure: CYSTOSCOPY FLEXIBLE;  Surgeon: Raynelle Bring, MD;  [facility];  Service: Urology;  Laterality: N/A;  . HERNIA REPAIR  2002  . PACEMAKER INSERTION  04/29/06   Generator change (BSc) by Dr Rayann Heman 06/21/12  . PERMANENT  PACEMAKER GENERATOR CHANGE N/A 06/08/2012   Procedure: PERMANENT PACEMAKER GENERATOR CHANGE;  Surgeon: Thompson Grayer, MD;  Location: [facility] CATH LAB;  Service: Cardiovascular;  Laterality: N/A;  . PROSTATE SURGERY    . PROSTATECTOMY  2002  . URINARY SPHINCTER IMPLANT N/A 03/07/2018   Procedure: ARTIFICIAL URINARY SPHINCTER REMOVAL;  Surgeon: Raynelle Bring, MD;  [facility]: [facility];  Service: Urology;  Laterality: N/A;  :   Current Outpatient Medications:  .  cholecalciferol (VITAMIN D) 1000 UNITS tablet, Take 2,000 Units by mouth daily., Disp: , Rfl:  .  fish oil-omega-3 fatty acids 1000 MG capsule, Take 2 g by mouth daily., Disp: , Rfl:  .  JANTOVEN 5 MG tablet, TAKE AS  DIRECTED BY COUMADIN CLINIC (Patient taking differently: TAKE 1 TABLET (5 MG) BY MOUTH DAILY AS DIRECTED BY COUMADIN CLINIC), Disp: 150 tablet, Rfl: 1 .  levETIRAcetam (KEPPRA) 500 MG tablet, Take 1 tablet (500 mg total) by mouth 2 (two) times daily., Disp: 180 tablet, Rfl: 3 .  Multiple Vitamin (MULTIVITAMIN WITH MINERALS) TABS, Take 1 tablet by mouth daily., Disp: , Rfl:  .  sertraline (ZOLOFT) 100 MG tablet, Take 100 mg by mouth daily., Disp: , Rfl:  .  torsemide (DEMADEX) 20 MG tablet, TAKE 1 TABLET BY MOUTH ONCE DAILY, Disp: 90 tablet, Rfl: 1 .  vitamin B-12 (CYANOCOBALAMIN) 1000 MCG tablet, Take 1,000 mcg by mouth every other day. , Disp: , Rfl: :  No Known Allergies:  Family History  Problem Relation Age of Onset  . Colon polyps Brother   . Heart disease Mother   . Heart attack Mother   . Heart attack Father   . Heart disease Father   . Diabetes Father        Also, three of his brothers had diabetes.  [name] Heart attack Brother   :  Social History   Socioeconomic History  . Marital status: Married    Spouse name: Not on file  . Number of children: 2  . Years of education: 8th grade  . Highest education level: Not on file  Occupational History  . Occupation: Retired  Scientific laboratory technician  . Financial resource strain: Not on file  . Food insecurity:    Worry: Not on file    Inability: Not on file  . Transportation needs:    Medical: Not on file    Non-medical: Not on file  Tobacco Use  . Smoking status: Never Smoker  . Smokeless tobacco: Never Used  Substance and Sexual Activity  . Alcohol use: No  . Drug use: No  . Sexual activity: Not on file  Lifestyle  . Physical activity:    Days per week: Not on file    Minutes per session: Not on file  . Stress: Not on file  Relationships  . Social connections:    Talks on phone: Not on file    Gets together: Not on file    Attends religious service: Not on file    Active member of club or organization: Not on file    Attends  meetings of clubs or organizations: Not on file    Relationship status: Not on file  . Intimate partner violence:    Fear of current or ex partner: Not on file    Emotionally abused: Not on file    Physically abused: Not on file    Forced sexual activity: Not on file  Other Topics Concern  . Not on file  Social History Narrative   Lives  in [place] with his wife.   Right-handed.   Retired Furniture conservator/restorer.   No caffeine use.  :  Pertinent items are noted in HPI.  Exam: Blood pressure (!) 112/43, pulse 60, temperature (!) 97.5 F (36.4 C), resp. rate 17, height _0  (1.676 m), weight 102 lb 11.2 oz (46.6 kg), SpO2 98 %.  ECOG 0 General appearance: alert and cooperative frail man without distress. Head: atraumatic without any abnormalities. Eyes: conjunctivae/corneas clear. PERRL.  Sclera anicteric. Throat: lips, mucosa, and tongue normal; without oral thrush or ulcers. Resp: clear to auscultation bilaterally without rhonchi, wheezes or dullness to percussion. Cardio: regular rate and rhythm, S1, S2 normal, no murmur, click, rub or gallop GI: soft, non-tender; bowel sounds normal; no masses,  no organomegaly Skin: Skin color, texture, turgor normal. No rashes or lesions Lymph nodes: Cervical, supraclavicular, and axillary nodes normal. Neurologic: Grossly normal without any motor, sensory or deep tendon reflexes. Musculoskeletal: No joint deformity or effusion.    Assessment and Plan:   84 year old man with the following:  1.  Anemia: This appears to be chronic in nature and dates back as to at least 2011.  He is to be multifactorial in nature with anemia of renal disease as well as recent worsening iron deficiency.  His most recent hemoglobin was 8.9 although he is asymptomatic.  He was started on oral iron replacement by Dr. Drema Dallas in the last month or so and he has been taking it.  The differential diagnosis was discussed today with the patient and his wife.  Anemia of renal  disease and recent iron deficiency anemia because of potential bleeding from his chronic anticoagulation and recent surgery could be contributing.  Primary bone marrow disease such as myelodysplastic syndrome is certainly a possibility at this time.  From a management standpoint, we have discussed the role of continuing oral iron as well as the use of intravenous iron and growth factor support were also reviewed.  I see no reason to proceed with a bone marrow biopsy at this time as it will not offer any changes in our management moving forward.  After discussion today, we have recommended continuing oral iron for at least 2 months to assess whether he has a adequate response at that time.  If he does and his hemoglobin does not improve, we will use Aranesp or growth factor support to boost his hemoglobin production.  If his iron stores are still in adequate despite oral iron, intravenous iron will be used.  I see no need for packed red cell transfusions at this time.  2.  Mild thrombocytopenia: Appears to be reactive in nature could be an early signs of MDS.  We will continue to monitor at this time.  3.  Monocytosis and leukocytopenia: This could be early signs of myelodysplasia.  He would not be a candidate for any aggressive therapy and supportive management is recommended at this time.  60  minutes was spent with the patient face-to-face today.  More than 50% of time was dedicated to reviewing laboratory data, differential diagnosis and management options of these conditions.    Thank you for the referral. A copy of this consult has been forwarded to the requesting physician.

## 2018-08-03 NOTE — Telephone Encounter (Signed)
Scheduled appt per 11/12 los- gave patient AVS and calender per los.   

## 2018-08-11 ENCOUNTER — Ambulatory Visit (INDEPENDENT_AMBULATORY_CARE_PROVIDER_SITE_OTHER): Payer: Medicare Other | Admitting: *Deleted

## 2018-08-11 DIAGNOSIS — I4821 Permanent atrial fibrillation: Secondary | ICD-10-CM

## 2018-08-11 DIAGNOSIS — Z5181 Encounter for therapeutic drug level monitoring: Secondary | ICD-10-CM | POA: Diagnosis not present

## 2018-08-11 DIAGNOSIS — I4891 Unspecified atrial fibrillation: Secondary | ICD-10-CM | POA: Diagnosis not present

## 2018-08-11 DIAGNOSIS — Z7901 Long term (current) use of anticoagulants: Secondary | ICD-10-CM

## 2018-08-11 LAB — POCT INR: INR: 2.8 (ref 2.0–3.0)

## 2018-08-11 NOTE — Patient Instructions (Signed)
Description   Continue taking 1 tablet daily except 1.5 tablets on Tuesdays and Saturdays.  Recheck INR in 4 weeks.  Call us with any concerns or medication changes # [phone] [facility], Main # [phone].

## 2018-09-08 ENCOUNTER — Ambulatory Visit (INDEPENDENT_AMBULATORY_CARE_PROVIDER_SITE_OTHER): Payer: Medicare Other | Admitting: *Deleted

## 2018-09-08 DIAGNOSIS — I4821 Permanent atrial fibrillation: Secondary | ICD-10-CM | POA: Diagnosis not present

## 2018-09-08 DIAGNOSIS — Z7901 Long term (current) use of anticoagulants: Secondary | ICD-10-CM

## 2018-09-08 DIAGNOSIS — I4891 Unspecified atrial fibrillation: Secondary | ICD-10-CM

## 2018-09-08 DIAGNOSIS — Z5181 Encounter for therapeutic drug level monitoring: Secondary | ICD-10-CM

## 2018-09-08 LAB — POCT INR: INR: 3.3 — AB (ref 2.0–3.0)

## 2018-09-08 NOTE — Patient Instructions (Signed)
Description   Do not take any Coumadin today then continue taking 1 tablet daily except 1.5 tablets on Tuesdays and Saturdays.  Recheck INR in 3 weeks.  Call us with any concerns or medication changes # [phone] [facility], Main # [phone].

## 2018-09-27 ENCOUNTER — Other Ambulatory Visit: Payer: Self-pay | Admitting: Interventional Cardiology

## 2018-09-29 ENCOUNTER — Ambulatory Visit (INDEPENDENT_AMBULATORY_CARE_PROVIDER_SITE_OTHER): Payer: Medicare Other | Admitting: *Deleted

## 2018-09-29 ENCOUNTER — Ambulatory Visit (INDEPENDENT_AMBULATORY_CARE_PROVIDER_SITE_OTHER): Payer: Medicare Other

## 2018-09-29 DIAGNOSIS — Z7901 Long term (current) use of anticoagulants: Secondary | ICD-10-CM

## 2018-09-29 DIAGNOSIS — Z5181 Encounter for therapeutic drug level monitoring: Secondary | ICD-10-CM | POA: Diagnosis not present

## 2018-09-29 DIAGNOSIS — I4891 Unspecified atrial fibrillation: Secondary | ICD-10-CM

## 2018-09-29 DIAGNOSIS — I495 Sick sinus syndrome: Secondary | ICD-10-CM

## 2018-09-29 DIAGNOSIS — I4821 Permanent atrial fibrillation: Secondary | ICD-10-CM

## 2018-09-29 LAB — POCT INR: INR: 2.6 (ref 2.0–3.0)

## 2018-09-29 NOTE — Patient Instructions (Signed)
Description   Continue taking 1 tablet daily except 1.5 tablets on Tuesdays and Saturdays.  Recheck INR in 4 weeks.  Call us with any concerns or medication changes # [phone] [facility] Clinic, Main # [phone].

## 2018-09-30 LAB — CUP PACEART REMOTE DEVICE CHECK
Implantable Lead Implant Date: 20130917
Implantable Lead Location: 753860
Implantable Lead Model: 4456
Implantable Lead Model: 4469
Implantable Pulse Generator Implant Date: 20130917
MDC IDC LEAD IMPLANT DT: 20130917
MDC IDC LEAD LOCATION: 753859
MDC IDC LEAD SERIAL: 448722
MDC IDC LEAD SERIAL: 484382
MDC IDC SESS DTM: 20200109081638
Pulse Gen Serial Number: 111635

## 2018-09-30 NOTE — Progress Notes (Signed)
Remote pacemaker transmission.   

## 2018-10-27 ENCOUNTER — Telehealth: Payer: Self-pay

## 2018-10-27 ENCOUNTER — Inpatient Hospital Stay: Payer: Medicare Other | Attending: Oncology | Admitting: Oncology

## 2018-10-27 ENCOUNTER — Inpatient Hospital Stay: Payer: Medicare Other

## 2018-10-27 VITALS — BP 125/50 | HR 59 | Temp 97.6°F | Resp 17 | Ht 66.0 in | Wt 106.1 lb

## 2018-10-27 DIAGNOSIS — D509 Iron deficiency anemia, unspecified: Secondary | ICD-10-CM | POA: Diagnosis not present

## 2018-10-27 DIAGNOSIS — N189 Chronic kidney disease, unspecified: Secondary | ICD-10-CM

## 2018-10-27 DIAGNOSIS — D696 Thrombocytopenia, unspecified: Secondary | ICD-10-CM | POA: Diagnosis not present

## 2018-10-27 DIAGNOSIS — K5900 Constipation, unspecified: Secondary | ICD-10-CM

## 2018-10-27 DIAGNOSIS — D631 Anemia in chronic kidney disease: Secondary | ICD-10-CM

## 2018-10-27 DIAGNOSIS — Z79899 Other long term (current) drug therapy: Secondary | ICD-10-CM | POA: Insufficient documentation

## 2018-10-27 DIAGNOSIS — D649 Anemia, unspecified: Secondary | ICD-10-CM

## 2018-10-27 DIAGNOSIS — Z7901 Long term (current) use of anticoagulants: Secondary | ICD-10-CM | POA: Insufficient documentation

## 2018-10-27 DIAGNOSIS — D72819 Decreased white blood cell count, unspecified: Secondary | ICD-10-CM | POA: Diagnosis not present

## 2018-10-27 LAB — CBC WITH DIFFERENTIAL (CANCER CENTER ONLY)
Abs Immature Granulocytes: 0 10*3/uL (ref 0.00–0.07)
Basophils Absolute: 0 10*3/uL (ref 0.0–0.1)
Basophils Relative: 0 %
Eosinophils Absolute: 0.1 10*3/uL (ref 0.0–0.5)
Eosinophils Relative: 3 %
HCT: 29.4 % — ABNORMAL LOW (ref 39.0–52.0)
Hemoglobin: 9.5 g/dL — ABNORMAL LOW (ref 13.0–17.0)
Immature Granulocytes: 0 %
Lymphocytes Relative: 22 %
Lymphs Abs: 0.9 10*3/uL (ref 0.7–4.0)
MCH: 31.9 pg (ref 26.0–34.0)
MCHC: 32.3 g/dL (ref 30.0–36.0)
MCV: 98.7 fL (ref 80.0–100.0)
Monocytes Absolute: 0.5 10*3/uL (ref 0.1–1.0)
Monocytes Relative: 12 %
Neutro Abs: 2.5 10*3/uL (ref 1.7–7.7)
Neutrophils Relative %: 63 %
Platelet Count: 108 10*3/uL — ABNORMAL LOW (ref 150–400)
RBC: 2.98 MIL/uL — ABNORMAL LOW (ref 4.22–5.81)
RDW: 12 % (ref 11.5–15.5)
WBC Count: 3.9 10*3/uL — ABNORMAL LOW (ref 4.0–10.5)
nRBC: 0 % (ref 0.0–0.2)

## 2018-10-27 LAB — CMP (CANCER CENTER ONLY)
ALBUMIN: 3.9 g/dL (ref 3.5–5.0)
ALT: 14 U/L (ref 0–44)
AST: 22 U/L (ref 15–41)
Alkaline Phosphatase: 83 U/L (ref 38–126)
Anion gap: 9 (ref 5–15)
BUN: 42 mg/dL — AB (ref 8–23)
CHLORIDE: 100 mmol/L (ref 98–111)
CO2: 31 mmol/L (ref 22–32)
CREATININE: 1.79 mg/dL — AB (ref 0.61–1.24)
Calcium: 9.1 mg/dL (ref 8.9–10.3)
GFR, Est AFR Am: 38 mL/min — ABNORMAL LOW (ref >60)
GFR, Estimated: 33 mL/min — ABNORMAL LOW (ref >60)
GLUCOSE: 91 mg/dL (ref 70–99)
POTASSIUM: 4.2 mmol/L (ref 3.5–5.1)
SODIUM: 140 mmol/L (ref 135–145)
Total Bilirubin: 0.5 mg/dL (ref 0.3–1.2)
Total Protein: 7 g/dL (ref 6.5–8.1)

## 2018-10-27 LAB — IRON AND TIBC
Iron: 55 ug/dL (ref 42–163)
Saturation Ratios: 17 % — ABNORMAL LOW (ref 20–55)
TIBC: 324 ug/dL (ref 202–409)
UIBC: 268 ug/dL (ref 117–376)

## 2018-10-27 LAB — FERRITIN: FERRITIN: 42 ng/mL (ref 24–336)

## 2018-10-27 LAB — SAVE SMEAR(SSMR), FOR PROVIDER SLIDE REVIEW

## 2018-10-27 NOTE — Progress Notes (Signed)
Hematology and Oncology Follow Up Visit  JEROL RUFENER [id number] 1927-01-29 85 y.o. 10/27/2018 2:37 PM Leighton Ruff, MDBarnes, Benjamine Mola, MD   Principle Diagnosis: 85 year old man with anemia diagnosed in June 2019.  His anemia is related to iron deficiency and chronic renal insufficiency.  Current therapy: Oral iron replacement.   Interim History: Mr. Helzer presents today for a follow-up visit.  Since the last visit, he reports no major changes in his health.  He continues to take oral iron supplements without any issues or concerns.  He does report some mild constipation but stool softeners have helped with these issues.  His appetite is reasonable and has not lost any weight.  Continues to attend activities of daily living.  He denies any hematochezia or melena or active bleeding.  Patient denied any alteration mental status, neuropathy, confusion or dizziness.  Denies any headaches or lethargy.  Denies any night sweats, weight loss or changes in appetite.  Denied orthopnea, dyspnea on exertion or chest discomfort.  Denies shortness of breath, difficulty breathing hemoptysis or cough.  Denies any abdominal distention, nausea, early satiety or dyspepsia.  Denies any hematuria, frequency, dysuria or nocturia.  Denies any skin irritation, dryness or rash.  Denies any ecchymosis or petechiae.  Denies any lymphadenopathy or clotting.  Denies any heat or cold intolerance.  Denies any anxiety or depression.  Remaining review of system is negative.          Medications: I have reviewed the patient's current medications.  Current Outpatient Medications  Medication Sig Dispense Refill  . cholecalciferol (VITAMIN D) 1000 UNITS tablet Take 2,000 Units by mouth daily.    . fish oil-omega-3 fatty acids 1000 MG capsule Take 2 g by mouth daily.    [name] JANTOVEN 5 MG tablet take as directed by coumadin clinic *150 tablets is a 90 day supply* 120 tablet 0  . levETIRAcetam (KEPPRA) 500 MG tablet  Take 1 tablet (500 mg total) by mouth 2 (two) times daily. 180 tablet 3  . Multiple Vitamin (MULTIVITAMIN WITH MINERALS) TABS Take 1 tablet by mouth daily.    . sertraline (ZOLOFT) 100 MG tablet Take 100 mg by mouth daily.    [name] torsemide (DEMADEX) 20 MG tablet TAKE 1 TABLET BY MOUTH ONCE DAILY 90 tablet 1  . vitamin B-12 (CYANOCOBALAMIN) 1000 MCG tablet Take 1,000 mcg by mouth every other day.      No current facility-administered medications for this visit.      Allergies: No Known Allergies  Past Medical History, Surgical history, Social history, and Family History were reviewed and updated.    Physical Exam: Blood pressure (!) 125/50, pulse (!) 59, temperature 97.6 F (36.4 C), resp. rate 17, height 5\' 6"  (1.676 m), weight 106 lb 1.6 oz (48.1 kg), SpO2 99 %.   ECOG: 1   General appearance: Comfortable appearing without any discomfort Head: Normocephalic without any trauma Oropharynx: Mucous membranes are moist and pink without any thrush or ulcers. Eyes: Pupils are equal and round reactive to light. Lymph nodes: No cervical, supraclavicular, inguinal or axillary lymphadenopathy.   Heart:regular rate and rhythm.  S1 and S2 without leg edema. Lung: Clear without any rhonchi or wheezes.  No dullness to percussion. Abdomin: Soft, nontender, nondistended with good bowel sounds.  No hepatosplenomegaly. Musculoskeletal: No joint deformity or effusion.  Full range of motion noted. Neurological: No deficits noted on motor, sensory and deep tendon reflex exam. Skin: No petechial rash or dryness.  Appeared moist.  Lab Results: Lab Results  Component Value Date   WBC 7.5 03/08/2018   HGB 7.6 (L) 03/08/2018   HCT 24.0 (L) 03/08/2018   MCV 96.0 03/08/2018   PLT 154 03/08/2018     Chemistry      Component Value Date/Time   NA 140 03/07/2018 0414   NA 136 05/21/2017 1609   K 3.4 (L) 03/07/2018 0414   CL 102 03/07/2018 0414   CO2 30 03/07/2018 0414   BUN 47 (H)  03/07/2018 0414   BUN 35 (H) 05/21/2017 1609   CREATININE 1.44 (H) 03/07/2018 0414   CREATININE 1.67 (H) 08/12/2016 1527      Component Value Date/Time   CALCIUM 8.4 (L) 03/07/2018 0414   ALKPHOS 99 07/22/2016 0845   AST 25 07/22/2016 0845   ALT 15 (L) 07/22/2016 0845   BILITOT 1.0 07/22/2016 0845         Impression and Plan:  85 year old man with:  1.    Multifactorial anemia: His anemia is related to chronic renal insufficiency as well as iron deficiency.   He continues to be on oral iron supplements with significant improvement in his hemoglobin currently at 9.5.  Other etiologies for his anemia could be early myelodysplastic syndrome given his monocytosis and thrombocytopenia.  At this time he would not be a candidate for any additional therapy but potentially growth factor support can be used if his hemoglobin drops again in the future.  2. thrombocytopenia: Unclear etiology.  Could be related to his bone marrow process.  We will continue to monitor for the time being.  3.  Monocytosis and leukocytopenia: Counts are not dramatically different at this time we will continue to monitor.  His monocytosis has resolved however.  4.  Follow-up: We will be in 6 months to follow his counts.  15  minutes was spent with the patient face-to-face today.  More than 50% of time was spent on reviewing laboratory data, differential diagnosis and discussing management options.     Zola Button, MD 2/5/20202:37 PM

## 2018-10-27 NOTE — Telephone Encounter (Signed)
Per 2/5 los patient declined avs and calender

## 2018-11-02 ENCOUNTER — Ambulatory Visit (INDEPENDENT_AMBULATORY_CARE_PROVIDER_SITE_OTHER): Payer: Medicare Other | Admitting: *Deleted

## 2018-11-02 DIAGNOSIS — Z7901 Long term (current) use of anticoagulants: Secondary | ICD-10-CM | POA: Diagnosis not present

## 2018-11-02 DIAGNOSIS — Z5181 Encounter for therapeutic drug level monitoring: Secondary | ICD-10-CM

## 2018-11-02 DIAGNOSIS — I4821 Permanent atrial fibrillation: Secondary | ICD-10-CM | POA: Diagnosis not present

## 2018-11-02 DIAGNOSIS — I4891 Unspecified atrial fibrillation: Secondary | ICD-10-CM | POA: Diagnosis not present

## 2018-11-02 LAB — POCT INR: INR: 2.9 (ref 2.0–3.0)

## 2018-11-02 NOTE — Patient Instructions (Signed)
Description   Continue taking 1 tablet daily except 1.5 tablets on Tuesdays and Saturdays.  Recheck INR in 5 weeks.  Call us with any concerns or medication changes # [phone] [facility], Main # [phone].

## 2018-11-23 NOTE — Progress Notes (Signed)
Cardiology Office Note   Date:  11/24/2018   ID:  Eddie Park, DOB [birth year], MRN [id number]  PCP:  Eddie Ruff, MD    No chief complaint on file.  Mitral regurgitation  Wt Readings from Last 3 Encounters:  11/24/18 106 lb 3.2 oz (48.2 kg)  10/27/18 106 lb 1.6 oz (48.1 kg)  08/03/18 102 lb 11.2 oz (46.6 kg)       History of Present Illness: Eddie Park is a 85 y.o. male  with AFib, mitral regurgitation, cardiomyopathy, and pacer dependent. He is now in AFib 100% of the time by pacer check. EF 45% in 2011.  Echo in 11/17 showed: Left ventricle: The cavity size was normal. There was mild concentric hypertrophy. Systolic function was mildly to moderately reduced. The estimated ejection fraction was in the range of 40% to 45%. Mild diffuse hypokinesis with no identifiable regional variations. The study is not technically sufficient to allow evaluation of LV diastolic function. - Ventricular septum: Septal motion showed paradox. These changes are consistent with right ventricular pacing. - Aortic valve: There was moderate regurgitation directed centrally in the LVOT. - Mitral valve: There was mild regurgitation directed centrally. - Left atrium: The atrium was severely dilated. - Right ventricle: The cavity size was mildly dilated. - Tricuspid valve: There was moderate regurgitation. - Pulmonary arteries: Systolic pressure was mildly increased. PA peak pressure: 39 mm Hg (S). - Pericardium, extracardiac: A small pericardial effusion was identified posterior to the heart. The fluid had no internal echoes.There was no evidence of hemodynamic compromise. There was a left pleural effusion.  In the past, he has had issues with falls, but this decreased with less Lasix.  He uses a cane.    Still taking Coumadin.  No other bleeding issues.    Had infection and urolgic surgery in 6/19.    Fluid management has been stable.  In 10/17,  he required increased diuretics for LE edema and more SHOB.  He was hospitalized in 11/17 and diuresed 3.6 L.   Since the last visit, he has had some chronic SHOB.  Weight has been stable at home.  No leg swelling.  He has intentionally gained weight.  He had gotten to 96 lbs.     Past Medical History:  Diagnosis Date  . Anticoagulated on Coumadin   . Anxiety   . Aortic insufficiency    a. mod-severe by echo 2014. b. mod by echo 2017.  [name] Chronic anemia   . Chronic combined systolic and diastolic CHF (congestive heart failure) ([place])   . CKD (chronic kidney disease), stage III ([place])   . Coronary artery disease    a. s/p MI 1995.  [name] Hard of hearing   . Hypertension   . Hyponatremia   . Mitral regurgitation    a. prev severe, more recently mild in 2017 -> not felt to be candidate for any kind of surgical repair.  [name] Permanent atrial fibrillation   . Prostate cancer ([place]) 2002  . Seizure disorder ([place])   . Tachycardia-bradycardia syndrome ([place])    a. w/ sycope - initial implant 2007, last gen change 2013 with BSX dual chamber PPM.    Past Surgical History:  Procedure Laterality Date  . artificial urinary sphincter    . CYSTOSCOPY N/A 03/07/2018   Procedure: CYSTOSCOPY FLEXIBLE;  Surgeon: Raynelle Bring, MD;  Location: [facility];  Service: Urology;  Laterality: N/A;  . HERNIA REPAIR  2002  . PACEMAKER INSERTION  04/29/06  Generator change (BSc) by Dr Rayann Heman 06/21/12  . PERMANENT PACEMAKER GENERATOR CHANGE N/A 06/08/2012   Procedure: PERMANENT PACEMAKER GENERATOR CHANGE;  Surgeon: Thompson Grayer, MD;  Location: [facility] CATH LAB;  Service: Cardiovascular;  Laterality: N/A;  . PROSTATE SURGERY    . PROSTATECTOMY  2002  . URINARY SPHINCTER IMPLANT N/A 03/07/2018   Procedure: ARTIFICIAL URINARY SPHINCTER REMOVAL;  Surgeon: Raynelle Bring, MD;  Location: [facility];  Service: Urology;  Laterality: N/A;     Current Outpatient Medications  Medication Sig Dispense Refill  . cholecalciferol (VITAMIN D)  1000 UNITS tablet Take 2,000 Units by mouth daily.    . ferrous sulfate 325 (65 FE) MG EC tablet Take 325 mg by mouth daily with breakfast.    . fish oil-omega-3 fatty acids 1000 MG capsule Take 2 g by mouth daily.    [name] JANTOVEN 5 MG tablet take as directed by coumadin clinic *150 tablets is a 90 day supply* 120 tablet 0  . levETIRAcetam (KEPPRA) 500 MG tablet Take 1 tablet (500 mg total) by mouth 2 (two) times daily. 180 tablet 3  . Multiple Vitamin (MULTIVITAMIN WITH MINERALS) TABS Take 1 tablet by mouth daily.    . sertraline (ZOLOFT) 100 MG tablet Take 100 mg by mouth daily.    [name] torsemide (DEMADEX) 20 MG tablet TAKE 1 TABLET BY MOUTH ONCE DAILY 90 tablet 1  . vitamin B-12 (CYANOCOBALAMIN) 1000 MCG tablet Take 1,000 mcg by mouth every other day.      No current facility-administered medications for this visit.     Allergies:   Patient has no known allergies.    Social History:  The patient  reports that he has never smoked. He has never used smokeless tobacco. He reports that he does not drink alcohol or use drugs.   Family History:  The patient's family history includes Colon polyps in his brother; Diabetes in his father; Heart attack in his brother, father, and mother; Heart disease in his father and mother.    ROS:  Please see the history of present illness.   Otherwise, review of systems are positive for feeling cold.   All other systems are reviewed and negative.    PHYSICAL EXAM: VS:  BP (!) 104/50   Pulse 65   Ht 5\' 6"  (1.676 m)   Wt 106 lb 3.2 oz (48.2 kg)   SpO2 98%   BMI 17.14 kg/m  , BMI Body mass index is 17.14 kg/m. GEN: Well nourished, well developed, in no acute distress  HEENT: normal  Neck: no JVD, carotid bruits, or masses Cardiac: RRR; no murmurs, rubs, or gallops,no edema  Respiratory:  clear to auscultation bilaterally, normal work of breathing GI: soft, nontender, nondistended, + BS MS: no deformity or atrophy  Skin: warm and dry, no rash Neuro:   Strength and sensation are intact Psych: euthymic mood, full affect    Recent Labs: 10/27/2018: ALT 14; BUN 42; Creatinine 1.79; Hemoglobin 9.5; Platelet Count 108; Potassium 4.2; Sodium 140   Lipid Panel    Component Value Date/Time   CHOL  09/26/2009 2344    142        ATP III CLASSIFICATION:  <200     mg/dL   Desirable  200-239  mg/dL   Borderline High  >=240    mg/dL   High          TRIG 65 09/26/2009 2344   HDL 42 09/26/2009 2344   CHOLHDL 3.4 09/26/2009 2344   VLDL 13 09/26/2009  [place]  09/26/2009 2344    87        Total Cholesterol/HDL:CHD Risk Coronary Heart Disease Risk Table                     Men   Women  1/2 Average Risk   3.4   3.3  Average Risk       5.0   4.4  2 X Average Risk   9.6   7.1  3 X Average Risk  23.4   11.0        Use the calculated Patient Ratio above and the CHD Risk Table to determine the patient's CHD Risk.        ATP III CLASSIFICATION (LDL):  <100     mg/dL   Optimal  100-129  mg/dL   Near or Above                    Optimal  130-159  mg/dL   Borderline  160-189  mg/dL   High  >190     mg/dL   Very High     Other studies Reviewed: Additional studies/ records that were reviewed today with results demonstrating: Cr 1.79 , potassium 4.2 in 10/2018.   ASSESSMENT AND PLAN:  1. AFib: Coumadin for stroke prevention.  No sx of palpitations 2. HTN:The current medical regimen is effective;  continue present plan and medications. 3. Chronic diastolic heart failure: Continue current dose of diuretics.  4. Mitral regurgitation: No signs of CHF.  Well compensated.  No indication for invasiv emanagement at this time.  5. Pacer: followed by EP.   Current medicines are reviewed at length with the patient today.  The patient concerns regarding his medicines were addressed.  The following changes have been made:  No change  Labs/ tests ordered today include:  No orders of the defined types were placed in this encounter.   Recommend  150 minutes/week of aerobic exercise Low fat, low carb, high fiber diet recommended  Disposition:   FU in 1 year   Signed, Larae Grooms, MD  11/24/2018 2:58 PM    [facility] [address], [place], [place]  [address] Phone: [phone]; Fax: [phone]

## 2018-11-24 ENCOUNTER — Ambulatory Visit (INDEPENDENT_AMBULATORY_CARE_PROVIDER_SITE_OTHER): Payer: Medicare Other | Admitting: Interventional Cardiology

## 2018-11-24 ENCOUNTER — Encounter: Payer: Self-pay | Admitting: Interventional Cardiology

## 2018-11-24 VITALS — BP 104/50 | HR 65 | Ht 66.0 in | Wt 106.2 lb

## 2018-11-24 DIAGNOSIS — I340 Nonrheumatic mitral (valve) insufficiency: Secondary | ICD-10-CM | POA: Diagnosis not present

## 2018-11-24 DIAGNOSIS — I4821 Permanent atrial fibrillation: Secondary | ICD-10-CM

## 2018-11-24 DIAGNOSIS — I10 Essential (primary) hypertension: Secondary | ICD-10-CM

## 2018-11-24 DIAGNOSIS — I5032 Chronic diastolic (congestive) heart failure: Secondary | ICD-10-CM

## 2018-11-24 NOTE — Patient Instructions (Signed)

## 2018-11-29 ENCOUNTER — Other Ambulatory Visit: Payer: Self-pay | Admitting: Neurology

## 2018-12-08 ENCOUNTER — Other Ambulatory Visit: Payer: Self-pay

## 2018-12-08 ENCOUNTER — Ambulatory Visit (INDEPENDENT_AMBULATORY_CARE_PROVIDER_SITE_OTHER): Payer: Medicare Other | Admitting: Pharmacist

## 2018-12-08 DIAGNOSIS — Z5181 Encounter for therapeutic drug level monitoring: Secondary | ICD-10-CM

## 2018-12-08 DIAGNOSIS — Z7901 Long term (current) use of anticoagulants: Secondary | ICD-10-CM

## 2018-12-08 DIAGNOSIS — I4891 Unspecified atrial fibrillation: Secondary | ICD-10-CM | POA: Diagnosis not present

## 2018-12-08 DIAGNOSIS — I4821 Permanent atrial fibrillation: Secondary | ICD-10-CM

## 2018-12-08 LAB — POCT INR: INR: 2.4 (ref 2.0–3.0)

## 2018-12-08 NOTE — Patient Instructions (Signed)
Description   Continue taking 1 tablet daily except 1.5 tablets on Tuesdays and Saturdays.  Recheck INR in 7 weeks.  Call us with any concerns or medication changes # [phone] [facility], Main # [phone].

## 2018-12-29 ENCOUNTER — Other Ambulatory Visit: Payer: Self-pay

## 2018-12-29 ENCOUNTER — Ambulatory Visit (INDEPENDENT_AMBULATORY_CARE_PROVIDER_SITE_OTHER): Payer: Medicare Other | Admitting: *Deleted

## 2018-12-29 DIAGNOSIS — I495 Sick sinus syndrome: Secondary | ICD-10-CM | POA: Diagnosis not present

## 2018-12-29 LAB — CUP PACEART REMOTE DEVICE CHECK
Battery Remaining Longevity: 72 mo
Battery Remaining Percentage: 76 %
Brady Statistic RA Percent Paced: 0 %
Brady Statistic RV Percent Paced: 99 %
Date Time Interrogation Session: 20200408044100
Implantable Lead Implant Date: 20130917
Implantable Lead Implant Date: 20130917
Implantable Lead Location: 753859
Implantable Lead Location: 753860
Implantable Lead Model: 4456
Implantable Lead Model: 4469
Implantable Lead Serial Number: 448722
Implantable Lead Serial Number: 484382
Implantable Pulse Generator Implant Date: 20130917
Lead Channel Impedance Value: 381 Ohm
Lead Channel Impedance Value: 385 Ohm
Lead Channel Setting Pacing Amplitude: 2.4 V
Lead Channel Setting Pacing Pulse Width: 0.4 ms
Lead Channel Setting Sensing Sensitivity: 2.5 mV
Pulse Gen Serial Number: 111635

## 2019-01-06 ENCOUNTER — Other Ambulatory Visit: Payer: Self-pay | Admitting: Interventional Cardiology

## 2019-01-07 ENCOUNTER — Encounter: Payer: Self-pay | Admitting: Cardiology

## 2019-01-07 NOTE — Progress Notes (Signed)
Remote pacemaker transmission.   

## 2019-01-19 DIAGNOSIS — E0590 Thyrotoxicosis, unspecified without thyrotoxic crisis or storm: Secondary | ICD-10-CM | POA: Diagnosis not present

## 2019-01-24 ENCOUNTER — Telehealth: Payer: Self-pay

## 2019-01-24 NOTE — Telephone Encounter (Signed)

## 2019-01-26 ENCOUNTER — Other Ambulatory Visit: Payer: Self-pay | Admitting: Interventional Cardiology

## 2019-01-26 ENCOUNTER — Other Ambulatory Visit: Payer: Self-pay

## 2019-01-26 ENCOUNTER — Ambulatory Visit (INDEPENDENT_AMBULATORY_CARE_PROVIDER_SITE_OTHER): Payer: Medicare Other | Admitting: Pharmacist Clinician (PhC)/ Clinical Pharmacy Specialist

## 2019-01-26 DIAGNOSIS — Z7901 Long term (current) use of anticoagulants: Secondary | ICD-10-CM

## 2019-01-26 DIAGNOSIS — Z5181 Encounter for therapeutic drug level monitoring: Secondary | ICD-10-CM | POA: Diagnosis not present

## 2019-01-26 DIAGNOSIS — I4891 Unspecified atrial fibrillation: Secondary | ICD-10-CM

## 2019-01-26 DIAGNOSIS — I4821 Permanent atrial fibrillation: Secondary | ICD-10-CM | POA: Diagnosis not present

## 2019-01-26 LAB — POCT INR: INR: 2.7 (ref 2.0–3.0)

## 2019-02-15 ENCOUNTER — Telehealth: Payer: Self-pay | Admitting: Neurology

## 2019-02-15 NOTE — Telephone Encounter (Signed)
5-26 wife has called to schedule annual f/u, she gave verbal consent to file insurance for [email or web address] E-mail confirmed as:[email or web address]  Pt understands that although there may be some limitations with this type of visit, we will take all precautions to reduce any security or privacy concerns.  Pt understands that this will be treated like an in office visit and we will file with pt's insurance, and there may be a patient responsible charge related to this service. *e-mail sent*

## 2019-03-09 ENCOUNTER — Telehealth: Payer: Self-pay

## 2019-03-09 NOTE — Telephone Encounter (Signed)

## 2019-03-10 ENCOUNTER — Encounter: Payer: Self-pay | Admitting: Neurology

## 2019-03-10 ENCOUNTER — Other Ambulatory Visit: Payer: Self-pay

## 2019-03-10 ENCOUNTER — Ambulatory Visit (INDEPENDENT_AMBULATORY_CARE_PROVIDER_SITE_OTHER): Payer: Medicare Other | Admitting: Neurology

## 2019-03-10 DIAGNOSIS — R569 Unspecified convulsions: Secondary | ICD-10-CM | POA: Diagnosis not present

## 2019-03-10 DIAGNOSIS — N183 Chronic kidney disease, stage 3 unspecified: Secondary | ICD-10-CM

## 2019-03-10 MED ORDER — LEVETIRACETAM 500 MG PO TABS: 500.0000 mg | ORAL_TABLET | Freq: Two times a day (BID) | ORAL | 4 refills | Status: DC

## 2019-03-10 NOTE — Progress Notes (Signed)
PATIENT: Eddie Park DOB: 02-07-1927  No chief complaint on file.    HISTORICAL  BENECIO KLUGER is a 85 year old male, seen in refer by his nephrologist Dr. Madelon Lips and his primary care physician Dr. Leighton Ruff for evaluation of seizure, initial evaluation was on September 09, 2017.  I reviewed and summarized the referring note, he has history of chronic renal disease, atrial fibrillation, coronary artery disease, hypertension, prostate cancer, anemia related to chronic renal failure, seizure disorder,  He reported history of seizures since 1999, was treated with phenobarbital 97.2 mg since then as well, he has 2 different kind of seizure, generalized tonic-clonic seizure without warning signs, or staring spells, last seizure was in 1999,  Chronic kidney disease has been under the care of his primary care for many years, was recently referred to nephrologist Dr. Madelon Lips, who is wondering if his long-term phenobarbital use contributed to his kidney disease,  I looked up Micromedex, there was no evidence to suggest that low-dose long-term phenobarbital use is related to kidney disease, but he has history of atrial fibrillation, taking Coumadin, phenobarbital is a enzyme inducer, is not a good antiepileptic medication in this scenario.  He is a retired Furniture conservator/restorer, since 2015, he was noted to have gradual onset memory loss, gait abnormality, I fell in 2017, also had urinary urgency, occasional incontinence, which he attributed to his prostate problem.  Laboratory evaluations, INR 2.5, CBC showed hemoglobin of 9.5, creatinine 1.4 1,  I personally reviewed a CT head in October 2017, chronic right cerebellar infarction, small vessel disease, mild cervical degenerative changes,  UPDATE Nov 12 2017: He was tapered off phenobarbital since end 2018, now taking Keppra 500mg  bid, no significant side effect. He continues to have mild memory loss, gait abnormality, was  noted to have distal weakness, urinary frequency, previously ordered MRI of lumbar spine to evaluate for possible lumbar radiculopathy, but wife declined,  Virtual Visit via Video  I connected with Ruth L Wachsmuth on 03/10/19 at  by Video and verified that I am speaking with the correct person using two identifiers.   I discussed the limitations, risks, security and privacy concerns of performing an evaluation and management service by video and the availability of in person appointments. I also discussed with the patient that there may be a patient responsible charge related to this service. The patient expressed understanding and agreed to proceed.   History of Present Illness: He is with his wife at today's virtual visit. He is taking keppra 500mg  bid, no recurrent seizure.  I reviewed laboratory evaluation on Feb 2020, creat 1.79, GFR 33.    Observations/Objective: I have reviewed problem lists, medications, allergies.  He is hard of hearing, awake alert, cooperative on exam, moving 4 extremities without difficulty, mildly unsteady gait.  Assessment and Plan: Epilepsy Chronic atrial fibrillation on Coumadin  Phenobarbital is an enzyme inducer, it is not a good option for patients taking Coumadin, he has been seizure-free for many years,   Repeat EEG showed generalized slowing, no evidence of epileptiform discharge,  He was able to successfully tapering off phenobarbital slowly since December 2018, tolerating  Keppra 500 mg twice a day well,  Refill his Rx.   Dementia  Slow worsening memory loss     Follow Up Instructions:   As needed.   I discussed the assessment and treatment plan with the patient. The patient was provided an opportunity to ask questions and all were answered. The patient agreed with the  plan and demonstrated an understanding of the instructions.   The patient was advised to call back or seek an in-person evaluation if the symptoms worsen or if the  condition fails to improve as anticipated.  I provided 15 minutes of non-face-to-face time during this encounter.   Marcial Pacas, MD

## 2019-03-14 NOTE — Addendum Note (Signed)
Addended by: Marcial Pacas on: 03/14/2019 12:56 PM   Modules accepted: Level of Service

## 2019-03-16 ENCOUNTER — Other Ambulatory Visit: Payer: Self-pay

## 2019-03-16 ENCOUNTER — Ambulatory Visit (INDEPENDENT_AMBULATORY_CARE_PROVIDER_SITE_OTHER): Payer: Medicare Other | Admitting: Pharmacist

## 2019-03-16 DIAGNOSIS — Z7901 Long term (current) use of anticoagulants: Secondary | ICD-10-CM | POA: Diagnosis not present

## 2019-03-16 DIAGNOSIS — Z5181 Encounter for therapeutic drug level monitoring: Secondary | ICD-10-CM | POA: Diagnosis not present

## 2019-03-16 DIAGNOSIS — I4821 Permanent atrial fibrillation: Secondary | ICD-10-CM | POA: Diagnosis not present

## 2019-03-16 DIAGNOSIS — I4891 Unspecified atrial fibrillation: Secondary | ICD-10-CM | POA: Diagnosis not present

## 2019-03-16 LAB — POCT INR: INR: 2.7 (ref 2.0–3.0)

## 2019-03-16 NOTE — Patient Instructions (Signed)
Description   Continue taking 1 tablet daily except 1.5 tablets on Tuesdays and Saturdays.  Recheck INR in 8 weeks.  Call us with any concerns or medication changes # [phone] [facility], Main # [phone].

## 2019-03-30 ENCOUNTER — Ambulatory Visit (INDEPENDENT_AMBULATORY_CARE_PROVIDER_SITE_OTHER): Payer: Medicare Other | Admitting: *Deleted

## 2019-03-30 DIAGNOSIS — I495 Sick sinus syndrome: Secondary | ICD-10-CM

## 2019-03-30 LAB — CUP PACEART REMOTE DEVICE CHECK
Battery Remaining Longevity: 66 mo
Battery Remaining Percentage: 74 %
Brady Statistic RA Percent Paced: 0 %
Brady Statistic RV Percent Paced: 99 %
Date Time Interrogation Session: 20200708044000
Implantable Lead Implant Date: 20130917
Implantable Lead Implant Date: 20130917
Implantable Lead Location: 753859
Implantable Lead Location: 753860
Implantable Lead Model: 4456
Implantable Lead Model: 4469
Implantable Lead Serial Number: 448722
Implantable Lead Serial Number: 484382
Implantable Pulse Generator Implant Date: 20130917
Lead Channel Impedance Value: 385 Ohm
Lead Channel Impedance Value: 403 Ohm
Lead Channel Setting Pacing Amplitude: 2.4 V
Lead Channel Setting Pacing Pulse Width: 0.4 ms
Lead Channel Setting Sensing Sensitivity: 2.5 mV
Pulse Gen Serial Number: 111635

## 2019-04-05 DIAGNOSIS — G40909 Epilepsy, unspecified, not intractable, without status epilepticus: Secondary | ICD-10-CM | POA: Diagnosis not present

## 2019-04-05 DIAGNOSIS — F0390 Unspecified dementia without behavioral disturbance: Secondary | ICD-10-CM | POA: Diagnosis not present

## 2019-04-05 DIAGNOSIS — Z8679 Personal history of other diseases of the circulatory system: Secondary | ICD-10-CM | POA: Diagnosis not present

## 2019-04-05 DIAGNOSIS — D631 Anemia in chronic kidney disease: Secondary | ICD-10-CM | POA: Diagnosis not present

## 2019-04-05 DIAGNOSIS — Z8546 Personal history of malignant neoplasm of prostate: Secondary | ICD-10-CM | POA: Diagnosis not present

## 2019-04-05 DIAGNOSIS — E7800 Pure hypercholesterolemia, unspecified: Secondary | ICD-10-CM | POA: Diagnosis not present

## 2019-04-05 DIAGNOSIS — I340 Nonrheumatic mitral (valve) insufficiency: Secondary | ICD-10-CM | POA: Diagnosis not present

## 2019-04-05 DIAGNOSIS — F339 Major depressive disorder, recurrent, unspecified: Secondary | ICD-10-CM | POA: Diagnosis not present

## 2019-04-05 DIAGNOSIS — E0590 Thyrotoxicosis, unspecified without thyrotoxic crisis or storm: Secondary | ICD-10-CM | POA: Diagnosis not present

## 2019-04-05 DIAGNOSIS — N183 Chronic kidney disease, stage 3 (moderate): Secondary | ICD-10-CM | POA: Diagnosis not present

## 2019-04-11 NOTE — Progress Notes (Signed)
Remote pacemaker transmission.   

## 2019-04-21 ENCOUNTER — Other Ambulatory Visit: Payer: Self-pay | Admitting: Interventional Cardiology

## 2019-04-27 ENCOUNTER — Inpatient Hospital Stay: Payer: Medicare Other

## 2019-04-27 ENCOUNTER — Other Ambulatory Visit: Payer: Self-pay

## 2019-04-27 ENCOUNTER — Inpatient Hospital Stay: Payer: Medicare Other | Attending: Oncology | Admitting: Oncology

## 2019-04-27 VITALS — BP 118/60 | HR 67 | Temp 99.1°F | Resp 17 | Ht 66.0 in | Wt 99.0 lb

## 2019-04-27 DIAGNOSIS — D6959 Other secondary thrombocytopenia: Secondary | ICD-10-CM | POA: Insufficient documentation

## 2019-04-27 DIAGNOSIS — E611 Iron deficiency: Secondary | ICD-10-CM | POA: Insufficient documentation

## 2019-04-27 DIAGNOSIS — Z79899 Other long term (current) drug therapy: Secondary | ICD-10-CM | POA: Insufficient documentation

## 2019-04-27 DIAGNOSIS — D649 Anemia, unspecified: Secondary | ICD-10-CM | POA: Diagnosis not present

## 2019-04-27 DIAGNOSIS — N189 Chronic kidney disease, unspecified: Secondary | ICD-10-CM | POA: Diagnosis not present

## 2019-04-27 DIAGNOSIS — D631 Anemia in chronic kidney disease: Secondary | ICD-10-CM | POA: Insufficient documentation

## 2019-04-27 DIAGNOSIS — Z7901 Long term (current) use of anticoagulants: Secondary | ICD-10-CM | POA: Diagnosis not present

## 2019-04-27 LAB — CBC WITH DIFFERENTIAL (CANCER CENTER ONLY)
Abs Immature Granulocytes: 0.03 10*3/uL (ref 0.00–0.07)
Basophils Absolute: 0 10*3/uL (ref 0.0–0.1)
Basophils Relative: 0 %
Eosinophils Absolute: 0.1 10*3/uL (ref 0.0–0.5)
Eosinophils Relative: 1 %
HCT: 30.9 % — ABNORMAL LOW (ref 39.0–52.0)
Hemoglobin: 10.1 g/dL — ABNORMAL LOW (ref 13.0–17.0)
Immature Granulocytes: 1 %
Lymphocytes Relative: 16 %
Lymphs Abs: 0.9 10*3/uL (ref 0.7–4.0)
MCH: 31.9 pg (ref 26.0–34.0)
MCHC: 32.7 g/dL (ref 30.0–36.0)
MCV: 97.5 fL (ref 80.0–100.0)
Monocytes Absolute: 0.5 10*3/uL (ref 0.1–1.0)
Monocytes Relative: 10 %
Neutro Abs: 3.8 10*3/uL (ref 1.7–7.7)
Neutrophils Relative %: 72 %
Platelet Count: 92 10*3/uL — ABNORMAL LOW (ref 150–400)
RBC: 3.17 MIL/uL — ABNORMAL LOW (ref 4.22–5.81)
RDW: 11.8 % (ref 11.5–15.5)
WBC Count: 5.3 10*3/uL (ref 4.0–10.5)
nRBC: 0 % (ref 0.0–0.2)

## 2019-04-27 NOTE — Progress Notes (Signed)
Hematology and Oncology Follow Up Visit  ESTES LEHNER [id number] March 29, 1927 85 y.o. 04/27/2019 1:14 PM Leighton Ruff, MDBarnes, Benjamine Mola, MD   Principle Diagnosis: 85 year old man with multifactorial anemia related to iron deficiency and chronic renal insufficiency diagnosed in June 2019.    Current therapy: Oral iron replacement on a daily basis.   Interim History: Mr. Branam returns today for a follow-up.  Since the last visit, he reports no major changes in his health.  His wife is providing the majority of the history and she has not reported any changes in his overall health.  He is tolerating oral iron replacement without any issues.  He does report a discoloration of stool although no major constipation issues.  He denies any nausea or excessive fatigue.  He denies any recent hospitalization or illnesses.   He denied headaches, blurry vision, syncope or seizures.  Denies any fevers, chills or sweats.  Denied chest pain, palpitation, orthopnea or leg edema.  Denied cough, wheezing or hemoptysis.  Denied nausea, vomiting or abdominal pain.  Denies any constipation or diarrhea.  Denies any frequency urgency or hesitancy.  Denies any arthralgias or myalgias.  Denies any skin rashes or lesions.  Denies any bleeding or clotting tendency.  Denies any easy bruising.  Denies any hair or nail changes.  Denies any anxiety or depression.  Remaining review of system is negative.             Medications: Updated today without any changes.  Current Outpatient Medications  Medication Sig Dispense Refill  . cholecalciferol (VITAMIN D) 1000 UNITS tablet Take 2,000 Units by mouth daily.    . ferrous sulfate 325 (65 FE) MG EC tablet Take 325 mg by mouth daily with breakfast.    . fish oil-omega-3 fatty acids 1000 MG capsule Take 2 g by mouth daily.    [name] levETIRAcetam (KEPPRA) 500 MG tablet Take 1 tablet (500 mg total) by mouth 2 (two) times daily. Please call [phone] to schedule yearly  follow up for med refills. 180 tablet 4  . Multiple Vitamin (MULTIVITAMIN WITH MINERALS) TABS Take 1 tablet by mouth daily.    . sertraline (ZOLOFT) 100 MG tablet Take 100 mg by mouth daily.    [name] torsemide (DEMADEX) 20 MG tablet Take 1 tablet by mouth once daily 90 tablet 2  . vitamin B-12 (CYANOCOBALAMIN) 1000 MCG tablet Take 1,000 mcg by mouth every other day.     . warfarin (COUMADIN) 5 MG tablet take as directed per coumadin clinic *90 day supply* 120 tablet 0   No current facility-administered medications for this visit.      Allergies: No Known Allergies  Past Medical History, Surgical history, Social history, and Family History reviewed today without any changes.    Physical Exam: Blood pressure 118/60, pulse 67, temperature 99.1 F (37.3 C), temperature source Oral, resp. rate 17, height 5\' 6"  (1.676 m), weight 99 lb (44.9 kg), SpO2 100 %.    ECOG: 1     General appearance: Alert, awake without any distress. Head: Atraumatic without abnormalities Oropharynx: Without any thrush or ulcers. Eyes: No scleral icterus. Lymph nodes: No lymphadenopathy noted in the cervical, supraclavicular, or axillary nodes Heart:regular rate and rhythm, without any murmurs or gallops.   Lung: Clear to auscultation without any rhonchi, wheezes or dullness to percussion. Abdomin: Soft, nontender without any shifting dullness or ascites. Musculoskeletal: No clubbing or cyanosis. Neurological: No motor or sensory deficits. Skin: No rashes or lesions. Psychiatric: Mood and affect appeared normal.  Lab Results: Lab Results  Component Value Date   WBC 3.9 (L) 10/27/2018   HGB 9.5 (L) 10/27/2018   HCT 29.4 (L) 10/27/2018   MCV 98.7 10/27/2018   PLT 108 (L) 10/27/2018     Chemistry      Component Value Date/Time   NA 140 10/27/2018 1427   NA 136 05/21/2017 1609   K 4.2 10/27/2018 1427   CL 100 10/27/2018 1427   CO2 31 10/27/2018 1427   BUN 42 (H) 10/27/2018 1427   BUN 35  (H) 05/21/2017 1609   CREATININE 1.79 (H) 10/27/2018 1427   CREATININE 1.67 (H) 08/12/2016 1527      Component Value Date/Time   CALCIUM 9.1 10/27/2018 1427   ALKPHOS 83 10/27/2018 1427   AST 22 10/27/2018 1427   ALT 14 10/27/2018 1427   BILITOT 0.5 10/27/2018 1427         Impression and Plan:  85 year old man with:  1.  Anemia related to chronic renal insufficiency and mild iron deficiency.    His hemoglobin today remains adequate at 10.1 without any issues or concerns at this time.  Risks and benefits of continuing oral iron versus use of growth factor support was discussed.  Given the fact that his hemoglobin remains stable without any major complaints.  I recommended continuing with the current approach.  2.  Thrombocytopenia: Chronic in nature dating back to at least 2011.  This could be related to an autoimmune phenomenon versus myelodysplasia.  He has no active bleeding at this time and does not require any intervention.  I recommended continued active surveillance.   3.  Follow-up: In 6 to 8 months for repeat evaluation.  15  minutes was spent with the patient face-to-face today.  More than 50% of time was dedicated to reviewing his laboratory data, differential diagnosis as well as management options.     Zola Button, MD 8/5/20201:14 PM

## 2019-04-28 ENCOUNTER — Telehealth: Payer: Self-pay | Admitting: Oncology

## 2019-04-28 NOTE — Telephone Encounter (Signed)
Scheduled per sch msg. Mailed printout.  °

## 2019-05-11 ENCOUNTER — Ambulatory Visit (INDEPENDENT_AMBULATORY_CARE_PROVIDER_SITE_OTHER): Payer: Medicare Other | Admitting: *Deleted

## 2019-05-11 ENCOUNTER — Other Ambulatory Visit: Payer: Self-pay

## 2019-05-11 DIAGNOSIS — Z5181 Encounter for therapeutic drug level monitoring: Secondary | ICD-10-CM | POA: Diagnosis not present

## 2019-05-11 DIAGNOSIS — Z7901 Long term (current) use of anticoagulants: Secondary | ICD-10-CM | POA: Diagnosis not present

## 2019-05-11 DIAGNOSIS — I4891 Unspecified atrial fibrillation: Secondary | ICD-10-CM | POA: Diagnosis not present

## 2019-05-11 DIAGNOSIS — I4821 Permanent atrial fibrillation: Secondary | ICD-10-CM | POA: Diagnosis not present

## 2019-05-11 LAB — POCT INR: INR: 2.4 (ref 2.0–3.0)

## 2019-05-11 NOTE — Patient Instructions (Signed)
Description   Continue taking 1 tablet daily except 1.5 tablets on Tuesdays and Saturdays.  Recheck INR in 8 weeks.  Call us with any concerns or medication changes # [phone] [facility] Main # [phone].     

## 2019-06-09 DIAGNOSIS — Z85828 Personal history of other malignant neoplasm of skin: Secondary | ICD-10-CM | POA: Diagnosis not present

## 2019-06-09 DIAGNOSIS — D485 Neoplasm of uncertain behavior of skin: Secondary | ICD-10-CM | POA: Diagnosis not present

## 2019-06-09 DIAGNOSIS — L570 Actinic keratosis: Secondary | ICD-10-CM | POA: Diagnosis not present

## 2019-06-09 DIAGNOSIS — D0422 Carcinoma in situ of skin of left ear and external auricular canal: Secondary | ICD-10-CM | POA: Diagnosis not present

## 2019-06-09 DIAGNOSIS — D225 Melanocytic nevi of trunk: Secondary | ICD-10-CM | POA: Diagnosis not present

## 2019-06-09 DIAGNOSIS — L814 Other melanin hyperpigmentation: Secondary | ICD-10-CM | POA: Diagnosis not present

## 2019-06-09 DIAGNOSIS — L821 Other seborrheic keratosis: Secondary | ICD-10-CM | POA: Diagnosis not present

## 2019-06-09 DIAGNOSIS — Z23 Encounter for immunization: Secondary | ICD-10-CM | POA: Diagnosis not present

## 2019-06-28 DIAGNOSIS — Z23 Encounter for immunization: Secondary | ICD-10-CM | POA: Diagnosis not present

## 2019-06-29 ENCOUNTER — Ambulatory Visit (INDEPENDENT_AMBULATORY_CARE_PROVIDER_SITE_OTHER): Payer: Medicare Other | Admitting: *Deleted

## 2019-06-29 DIAGNOSIS — I495 Sick sinus syndrome: Secondary | ICD-10-CM | POA: Diagnosis not present

## 2019-06-29 DIAGNOSIS — I5032 Chronic diastolic (congestive) heart failure: Secondary | ICD-10-CM

## 2019-06-30 LAB — CUP PACEART REMOTE DEVICE CHECK
Battery Remaining Longevity: 66 mo
Battery Remaining Percentage: 71 %
Brady Statistic RA Percent Paced: 0 %
Brady Statistic RV Percent Paced: 99 %
Date Time Interrogation Session: 20201007044200
Implantable Lead Implant Date: 20130917
Implantable Lead Implant Date: 20130917
Implantable Lead Location: 753859
Implantable Lead Location: 753860
Implantable Lead Model: 4456
Implantable Lead Model: 4469
Implantable Lead Serial Number: 448722
Implantable Lead Serial Number: 484382
Implantable Pulse Generator Implant Date: 20130917
Lead Channel Impedance Value: 400 Ohm
Lead Channel Impedance Value: 413 Ohm
Lead Channel Setting Pacing Amplitude: 2.4 V
Lead Channel Setting Pacing Pulse Width: 0.4 ms
Lead Channel Setting Sensing Sensitivity: 2.5 mV
Pulse Gen Serial Number: 111635

## 2019-07-05 NOTE — Progress Notes (Signed)
Electrophysiology Office Note Date: 07/05/2019  ID:  Eddie Park, DOB [birth year], MRN [id number]  PCP: Leighton Ruff, MD Primary Cardiologist: Larae Grooms, MD Electrophysiologist: None  CC: Pacemaker follow-up  Eddie Park is a 85 y.o. male with tachy/brady syndrome, permanent afib, chronic diastolic CHF, and severe MR seen today for Dr. Rayann Heman. He presents today for routine electrophysiology followup.  Since last being seen in our clinic, the patient reports doing well overall. He does not have very much energy or appetite, but this has been steady per his wife. He denies chest pain, palpitations, dyspnea, PND, orthopnea, nausea, vomiting, dizziness, syncope, edema, weight gain, or early satiety.  He sleeps with the head of his bed elevated chronically.   Device History: Engineer, agricultural PPM implanted 2013 for tachy/brady  Past Medical History:  Diagnosis Date  . Anticoagulated on Coumadin   . Anxiety   . Aortic insufficiency    a. mod-severe by echo 2014. b. mod by echo 2017.  [name] Chronic anemia   . Chronic combined systolic and diastolic CHF (congestive heart failure) ([place])   . CKD (chronic kidney disease), stage III ([place])   . Coronary artery disease    a. s/p MI 1995.  [name] Hard of hearing   . Hypertension   . Hyponatremia   . Mitral regurgitation    a. prev severe, more recently mild in 2017 -> not felt to be candidate for any kind of surgical repair.  [name] Permanent atrial fibrillation   . Prostate cancer ([place]) 2002  . Seizure disorder ([place])   . Tachycardia-bradycardia syndrome ([place])    a. w/ sycope - initial implant 2007, last gen change 2013 with BSX dual chamber PPM.   Past Surgical History:  Procedure Laterality Date  . artificial urinary sphincter    . CYSTOSCOPY N/A 03/07/2018   Procedure: CYSTOSCOPY FLEXIBLE;  Surgeon: Raynelle Bring, MD;  Location: [facility];  Service: Urology;  Laterality: N/A;  . HERNIA REPAIR  2002  . PACEMAKER  INSERTION  04/29/06   Generator change (B[facility]) by Dr Rayann Heman 06/21/12  . PERMANENT PACEMAKER GENERATOR CHANGE N/A 06/08/2012   Procedure: PERMANENT PACEMAKER GENERATOR CHANGE;  Surgeon: Thompson Grayer, MD;  Location: [facility] CATH LAB;  Service: Cardiovascular;  Laterality: N/A;  . PROSTATE SURGERY    . PROSTATECTOMY  2002  . URINARY SPHINCTER IMPLANT N/A 03/07/2018   Procedure: ARTIFICIAL URINARY SPHINCTER REMOVAL;  Surgeon: Raynelle Bring, MD;  Location: [facility];  Service: Urology;  Laterality: N/A;    Current Outpatient Medications  Medication Sig Dispense Refill  . cholecalciferol (VITAMIN D) 1000 UNITS tablet Take 2,000 Units by mouth daily.    . ferrous sulfate 325 (65 FE) MG EC tablet Take 325 mg by mouth daily with breakfast.    . fish oil-omega-3 fatty acids 1000 MG capsule Take 2 g by mouth daily.    [name] levETIRAcetam (KEPPRA) 500 MG tablet Take 1 tablet (500 mg total) by mouth 2 (two) times daily. Please call [phone] to schedule yearly follow up for med refills. 180 tablet 4  . Multiple Vitamin (MULTIVITAMIN WITH MINERALS) TABS Take 1 tablet by mouth daily.    . sertraline (ZOLOFT) 100 MG tablet Take 100 mg by mouth daily.    [name] torsemide (DEMADEX) 20 MG tablet Take 1 tablet by mouth once daily 90 tablet 2  . vitamin B-12 (CYANOCOBALAMIN) 1000 MCG tablet Take 1,000 mcg by mouth every other day.     . warfarin (COUMADIN) 5 MG tablet  take as directed per coumadin clinic *90 day supply* 120 tablet 0   No current facility-administered medications for this visit.     Allergies:   Patient has no known allergies.   Social History: Social History   Socioeconomic History  . Marital status: Married    Spouse name: Not on file  . Number of children: 2  . Years of education: 8th grade  . Highest education level: Not on file  Occupational History  . Occupation: Retired  Scientific laboratory technician  . Financial resource strain: Not on file  . Food insecurity    Worry: Not on file    Inability: Not on file  .  Transportation needs    Medical: Not on file    Non-medical: Not on file  Tobacco Use  . Smoking status: Never Smoker  . Smokeless tobacco: Never Used  Substance and Sexual Activity  . Alcohol use: No  . Drug use: No  . Sexual activity: Not on file  Lifestyle  . Physical activity    Days per week: Not on file    Minutes per session: Not on file  . Stress: Not on file  Relationships  . Social Herbalist on phone: Not on file    Gets together: Not on file    Attends religious service: Not on file    Active member of club or organization: Not on file    Attends meetings of clubs or organizations: Not on file    Relationship status: Not on file  . Intimate partner violence    Fear of current or ex partner: Not on file    Emotionally abused: Not on file    Physically abused: Not on file    Forced sexual activity: Not on file  Other Topics Concern  . Not on file  Social History Narrative   Lives in [place] with his wife.   Right-handed.   Retired Furniture conservator/restorer.   No caffeine use.    Family History: Family History  Problem Relation Age of Onset  . Colon polyps Brother   . Heart disease Mother   . Heart attack Mother   . Heart attack Father   . Heart disease Father   . Diabetes Father        Also, three of his brothers had diabetes.  [name] Heart attack Brother      Review of Systems: All other systems reviewed and are otherwise negative except as noted above.  Physical Exam: Vitals:   07/06/19 1012  BP: (!) 132/56  Pulse: 61  Weight: 96 lb (43.5 kg)  Height: 5\' 6"  (1.676 m)     GEN- The patient is elderly appearing, alert and oriented x 3 today.   HEENT: normocephalic, atraumatic; sclera clear, conjunctiva pink; hearing intact; oropharynx clear; neck supple  Lungs- Clear to ausculation bilaterally, normal work of breathing.  No wheezes, rales, rhonchi Heart- Regular rate and rhythm, no murmurs, rubs or gallops  GI- soft, non-tender, non-distended, bowel  sounds present  Extremities- no clubbing, cyanosis, or edema  MS- no significant deformity or atrophy Skin- warm and dry, no rash or lesion; PPM pocket well healed Psych- euthymic mood, full affect Neuro- strength and sensation are intact  PPM Interrogation- reviewed in detail today,  See PACEART report  EKG:  EKG is ordered today. The ekg ordered today shows V paced rhythm at 61 bpm with underlying Afib.   Recent Labs: 10/27/2018: ALT 14; BUN 42; Creatinine 1.79; Potassium 4.2; Sodium 140  04/27/2019: Hemoglobin 10.1; Platelet Count 92   Wt Readings from Last 3 Encounters:  04/27/19 99 lb (44.9 kg)  11/24/18 106 lb 3.2 oz (48.2 kg)  10/27/18 106 lb 1.6 oz (48.1 kg)     Other studies Reviewed: Additional studies/ records that were reviewed today include: Echo 07/2016 shows LVEF 40-45%, Previous EP office notes, Previous remote checks, Most recent labwork.   Assessment and Plan:  1.  Tachybrady syndrome s/p [employer] PPM  Normal PPM function See Claudia Desanctis Art report No changes today  2. Permanent atrial fibrillation Controlled V rates. Continue coumadin for CHA2DS2VASC of 5 INR check today with coumadin clinic.  3. Chronic diastolic CHF Volume status stable on exam.  Continue current regimen Declined BMET today with pending hematology follow up. They will ask for a BMET at that time.   4. Severe MR Previously noted. Has declined intervention with relative lack of symptoms  5. HTN Continue current regimen.  Would not aggressively manage   Current medicines are reviewed at length with the patient today.   The patient does not have concerns regarding his medicines.  The following changes were made today:  none  Labs/ tests ordered today include:  Orders Placed This Encounter  Procedures  . CUP PACEART [place]  . EKG 12-Lead   Disposition:   Follow up with Dr. Rayann Heman or EP APP in 12 Months   Signed, Annamaria Helling  07/05/2019 3:52  PM  [place] [address] [place] [address] [phone] (office) [phone] (fax)

## 2019-07-06 ENCOUNTER — Ambulatory Visit (INDEPENDENT_AMBULATORY_CARE_PROVIDER_SITE_OTHER): Payer: Medicare Other | Admitting: Pharmacist

## 2019-07-06 ENCOUNTER — Other Ambulatory Visit: Payer: Self-pay

## 2019-07-06 ENCOUNTER — Ambulatory Visit (INDEPENDENT_AMBULATORY_CARE_PROVIDER_SITE_OTHER): Payer: Medicare Other | Admitting: Student

## 2019-07-06 VITALS — BP 132/56 | HR 61 | Ht 66.0 in | Wt 96.0 lb

## 2019-07-06 DIAGNOSIS — I340 Nonrheumatic mitral (valve) insufficiency: Secondary | ICD-10-CM

## 2019-07-06 DIAGNOSIS — I4821 Permanent atrial fibrillation: Secondary | ICD-10-CM

## 2019-07-06 DIAGNOSIS — I4891 Unspecified atrial fibrillation: Secondary | ICD-10-CM | POA: Diagnosis not present

## 2019-07-06 DIAGNOSIS — Z7901 Long term (current) use of anticoagulants: Secondary | ICD-10-CM | POA: Diagnosis not present

## 2019-07-06 DIAGNOSIS — I5032 Chronic diastolic (congestive) heart failure: Secondary | ICD-10-CM | POA: Diagnosis not present

## 2019-07-06 DIAGNOSIS — Z5181 Encounter for therapeutic drug level monitoring: Secondary | ICD-10-CM

## 2019-07-06 LAB — POCT INR: INR: 3.9 — AB (ref 2.0–3.0)

## 2019-07-06 LAB — CUP PACEART INCLINIC DEVICE CHECK
Date Time Interrogation Session: 20201014040000
Implantable Lead Implant Date: 20130917
Implantable Lead Implant Date: 20130917
Implantable Lead Location: 753859
Implantable Lead Location: 753860
Implantable Lead Model: 4456
Implantable Lead Model: 4469
Implantable Lead Serial Number: 448722
Implantable Lead Serial Number: 484382
Implantable Pulse Generator Implant Date: 20130917
Lead Channel Impedance Value: 392 Ohm
Lead Channel Impedance Value: 396 Ohm
Lead Channel Pacing Threshold Amplitude: 0.9 V
Lead Channel Pacing Threshold Pulse Width: 0.4 ms
Lead Channel Setting Pacing Amplitude: 2.4 V
Lead Channel Setting Pacing Pulse Width: 0.4 ms
Lead Channel Setting Sensing Sensitivity: 2.5 mV
Pulse Gen Serial Number: 111635

## 2019-07-06 NOTE — Patient Instructions (Addendum)
Description   Skip today's dose and then continue taking 1 tablet daily except 1.5 tablets on Tuesdays and Saturdays.  Recheck INR in 3 weeks.  Call us with any concerns or medication changes # [phone] [facility], Main # [phone].

## 2019-07-06 NOTE — Patient Instructions (Signed)
Medication Instructions:  Your physician recommends that you continue on your current medications as directed. Please refer to the Current Medication list given to you today.  If you need a refill on your cardiac medications before your next appointment, please call your pharmacy.   Lab work: NONE ORDERED  TODAY   If you have labs (blood work) drawn today and your tests are completely normal, you will receive your results only by: [name] MyChart Message (if you have MyChart) OR . A paper copy in the mail If you have any lab test that is abnormal or we need to change your treatment, we will call you to review the results.  Testing/Procedures: NONE ORDERED  TODAY   Follow-Up: At [facility], you and your health needs are our priority.  As part of our continuing mission to provide you with exceptional heart care, we have created designated Provider Care Teams.  These Care Teams include your primary Cardiologist (physician) and Advanced Practice Providers (APPs -  Physician Assistants and Nurse Practitioners) who all work together to provide you with the care you need, when you need it. You will need a follow up appointment in 1 years.  Please call our office 2 months in advance to schedule this appointment.  You may see Dr. Rayann Heman or one of the following Advanced Practice Providers on your designated Care Team:   Chanetta Marshall, NP . Tommye Standard, PA-C . Donavan Burnet PA-C   Any Other Special Instructions Will Be Listed Below (If Applicable).

## 2019-07-11 NOTE — Progress Notes (Signed)
Remote pacemaker transmission.   

## 2019-07-12 DIAGNOSIS — F339 Major depressive disorder, recurrent, unspecified: Secondary | ICD-10-CM | POA: Diagnosis not present

## 2019-07-12 DIAGNOSIS — D631 Anemia in chronic kidney disease: Secondary | ICD-10-CM | POA: Diagnosis not present

## 2019-07-12 DIAGNOSIS — E7800 Pure hypercholesterolemia, unspecified: Secondary | ICD-10-CM | POA: Diagnosis not present

## 2019-07-12 DIAGNOSIS — F0390 Unspecified dementia without behavioral disturbance: Secondary | ICD-10-CM | POA: Diagnosis not present

## 2019-07-12 DIAGNOSIS — Z8546 Personal history of malignant neoplasm of prostate: Secondary | ICD-10-CM | POA: Diagnosis not present

## 2019-07-12 DIAGNOSIS — N183 Chronic kidney disease, stage 3 unspecified: Secondary | ICD-10-CM | POA: Diagnosis not present

## 2019-07-12 DIAGNOSIS — I5032 Chronic diastolic (congestive) heart failure: Secondary | ICD-10-CM | POA: Diagnosis not present

## 2019-07-12 DIAGNOSIS — I2510 Atherosclerotic heart disease of native coronary artery without angina pectoris: Secondary | ICD-10-CM | POA: Diagnosis not present

## 2019-07-14 DIAGNOSIS — D0422 Carcinoma in situ of skin of left ear and external auricular canal: Secondary | ICD-10-CM | POA: Diagnosis not present

## 2019-07-27 ENCOUNTER — Other Ambulatory Visit: Payer: Self-pay

## 2019-07-27 ENCOUNTER — Ambulatory Visit (INDEPENDENT_AMBULATORY_CARE_PROVIDER_SITE_OTHER): Payer: Medicare Other | Admitting: *Deleted

## 2019-07-27 DIAGNOSIS — I4891 Unspecified atrial fibrillation: Secondary | ICD-10-CM

## 2019-07-27 DIAGNOSIS — Z7901 Long term (current) use of anticoagulants: Secondary | ICD-10-CM

## 2019-07-27 DIAGNOSIS — Z5181 Encounter for therapeutic drug level monitoring: Secondary | ICD-10-CM

## 2019-07-27 DIAGNOSIS — I4821 Permanent atrial fibrillation: Secondary | ICD-10-CM | POA: Diagnosis not present

## 2019-07-27 LAB — POCT INR: INR: 3.1 — AB (ref 2.0–3.0)

## 2019-07-27 NOTE — Patient Instructions (Signed)
Description   Take 1/2 of a tablet today, then start taking 1 tablet daily, except for 1.5 tablets on Tuesdays. Recheck INR in 3 weeks.  Call us with any concerns or medication changes # [phone] [facility], Main # [phone].

## 2019-07-31 ENCOUNTER — Other Ambulatory Visit: Payer: Self-pay | Admitting: Interventional Cardiology

## 2019-08-17 ENCOUNTER — Ambulatory Visit (INDEPENDENT_AMBULATORY_CARE_PROVIDER_SITE_OTHER): Payer: Medicare Other | Admitting: *Deleted

## 2019-08-17 ENCOUNTER — Other Ambulatory Visit: Payer: Self-pay

## 2019-08-17 DIAGNOSIS — I4891 Unspecified atrial fibrillation: Secondary | ICD-10-CM | POA: Diagnosis not present

## 2019-08-17 DIAGNOSIS — Z5181 Encounter for therapeutic drug level monitoring: Secondary | ICD-10-CM | POA: Diagnosis not present

## 2019-08-17 DIAGNOSIS — I4821 Permanent atrial fibrillation: Secondary | ICD-10-CM

## 2019-08-17 DIAGNOSIS — Z7901 Long term (current) use of anticoagulants: Secondary | ICD-10-CM

## 2019-08-17 LAB — POCT INR: INR: 2.7 (ref 2.0–3.0)

## 2019-08-17 NOTE — Patient Instructions (Addendum)
Description   Continue taking 1 tablet daily, except for 1.5 tablets on Saturdays. Recheck INR in 5 weeks.  Call us with any concerns or medication changes # [phone] [facility], Main # [phone].

## 2019-09-21 ENCOUNTER — Ambulatory Visit (INDEPENDENT_AMBULATORY_CARE_PROVIDER_SITE_OTHER): Payer: Medicare Other | Admitting: *Deleted

## 2019-09-21 ENCOUNTER — Other Ambulatory Visit: Payer: Self-pay

## 2019-09-21 DIAGNOSIS — Z5181 Encounter for therapeutic drug level monitoring: Secondary | ICD-10-CM

## 2019-09-21 DIAGNOSIS — I4821 Permanent atrial fibrillation: Secondary | ICD-10-CM

## 2019-09-21 DIAGNOSIS — I4891 Unspecified atrial fibrillation: Secondary | ICD-10-CM | POA: Diagnosis not present

## 2019-09-21 DIAGNOSIS — Z7901 Long term (current) use of anticoagulants: Secondary | ICD-10-CM | POA: Diagnosis not present

## 2019-09-21 LAB — POCT INR: INR: 3.6 — AB (ref 2.0–3.0)

## 2019-09-21 NOTE — Patient Instructions (Signed)
Description   Do not take any Warfarin today then continue taking 1 tablet daily, except for 1.5 tablets on Saturdays. Recheck INR in 3 weeks.  Call us with any concerns or medication changes # [phone] [facility], Main # [phone].

## 2019-09-28 ENCOUNTER — Ambulatory Visit (INDEPENDENT_AMBULATORY_CARE_PROVIDER_SITE_OTHER): Payer: Medicare Other | Admitting: *Deleted

## 2019-09-28 DIAGNOSIS — I495 Sick sinus syndrome: Secondary | ICD-10-CM | POA: Diagnosis not present

## 2019-09-29 DIAGNOSIS — D631 Anemia in chronic kidney disease: Secondary | ICD-10-CM | POA: Diagnosis not present

## 2019-09-29 DIAGNOSIS — Z8546 Personal history of malignant neoplasm of prostate: Secondary | ICD-10-CM | POA: Diagnosis not present

## 2019-09-29 DIAGNOSIS — N183 Chronic kidney disease, stage 3 unspecified: Secondary | ICD-10-CM | POA: Diagnosis not present

## 2019-09-29 DIAGNOSIS — E7800 Pure hypercholesterolemia, unspecified: Secondary | ICD-10-CM | POA: Diagnosis not present

## 2019-09-29 DIAGNOSIS — F339 Major depressive disorder, recurrent, unspecified: Secondary | ICD-10-CM | POA: Diagnosis not present

## 2019-09-29 DIAGNOSIS — F0390 Unspecified dementia without behavioral disturbance: Secondary | ICD-10-CM | POA: Diagnosis not present

## 2019-09-29 DIAGNOSIS — I2510 Atherosclerotic heart disease of native coronary artery without angina pectoris: Secondary | ICD-10-CM | POA: Diagnosis not present

## 2019-09-29 DIAGNOSIS — I5032 Chronic diastolic (congestive) heart failure: Secondary | ICD-10-CM | POA: Diagnosis not present

## 2019-09-29 LAB — CUP PACEART REMOTE DEVICE CHECK
Battery Remaining Longevity: 66 mo
Battery Remaining Percentage: 67 %
Brady Statistic RA Percent Paced: 0 %
Brady Statistic RV Percent Paced: 98 %
Date Time Interrogation Session: 20210106004100
Implantable Lead Implant Date: 20130917
Implantable Lead Implant Date: 20130917
Implantable Lead Location: 753859
Implantable Lead Location: 753860
Implantable Lead Model: 4456
Implantable Lead Model: 4469
Implantable Lead Serial Number: 448722
Implantable Lead Serial Number: 484382
Implantable Pulse Generator Implant Date: 20130917
Lead Channel Impedance Value: 375 Ohm
Lead Channel Impedance Value: 397 Ohm
Lead Channel Setting Pacing Amplitude: 2.4 V
Lead Channel Setting Pacing Pulse Width: 0.4 ms
Lead Channel Setting Sensing Sensitivity: 2.5 mV
Pulse Gen Serial Number: 111635

## 2019-10-12 ENCOUNTER — Other Ambulatory Visit: Payer: Self-pay

## 2019-10-12 ENCOUNTER — Ambulatory Visit (INDEPENDENT_AMBULATORY_CARE_PROVIDER_SITE_OTHER): Payer: Medicare Other

## 2019-10-12 DIAGNOSIS — I4891 Unspecified atrial fibrillation: Secondary | ICD-10-CM

## 2019-10-12 DIAGNOSIS — Z7901 Long term (current) use of anticoagulants: Secondary | ICD-10-CM

## 2019-10-12 DIAGNOSIS — Z5181 Encounter for therapeutic drug level monitoring: Secondary | ICD-10-CM | POA: Diagnosis not present

## 2019-10-12 DIAGNOSIS — I4821 Permanent atrial fibrillation: Secondary | ICD-10-CM | POA: Diagnosis not present

## 2019-10-12 LAB — POCT INR: INR: 2 (ref 2.0–3.0)

## 2019-10-12 NOTE — Patient Instructions (Signed)
Description   Continue on same dosage 1 tablet daily, except for 1.5 tablets on Saturdays. Recheck INR in 4 weeks.  Call us with any concerns or medication changes # [phone] [facility], Main # [phone].

## 2019-10-31 ENCOUNTER — Other Ambulatory Visit: Payer: Self-pay | Admitting: Interventional Cardiology

## 2019-11-09 ENCOUNTER — Other Ambulatory Visit: Payer: Self-pay

## 2019-11-09 ENCOUNTER — Ambulatory Visit (INDEPENDENT_AMBULATORY_CARE_PROVIDER_SITE_OTHER): Payer: Medicare Other | Admitting: *Deleted

## 2019-11-09 DIAGNOSIS — I4821 Permanent atrial fibrillation: Secondary | ICD-10-CM | POA: Diagnosis not present

## 2019-11-09 DIAGNOSIS — Z5181 Encounter for therapeutic drug level monitoring: Secondary | ICD-10-CM | POA: Diagnosis not present

## 2019-11-09 DIAGNOSIS — Z7901 Long term (current) use of anticoagulants: Secondary | ICD-10-CM | POA: Diagnosis not present

## 2019-11-09 DIAGNOSIS — I4891 Unspecified atrial fibrillation: Secondary | ICD-10-CM

## 2019-11-09 LAB — POCT INR: INR: 3.2 — AB (ref 2.0–3.0)

## 2019-11-09 NOTE — Patient Instructions (Signed)
Description   Hold today, then continue on same dosage 1 tablet daily, except for 1.5 tablets on Saturdays. Recheck INR in 3 weeks.  Call us with any concerns or medication changes # [phone] [facility], Main # [phone].

## 2019-11-28 NOTE — Progress Notes (Signed)
Cardiology Office Note   Date:  11/29/2019   ID:  Eddie Park, DOB 12-01-1926, MRN [id number]  PCP:  Eddie Ruff, MD    No chief complaint on file.  AFib/mitral reguritation  Wt Readings from Last 3 Encounters:  11/29/19 96 lb 12.8 oz (43.9 kg)  07/06/19 96 lb (43.5 kg)  04/27/19 99 lb (44.9 kg)       History of Present Illness: Eddie Park is a 86 y.o. male   with AFib, mitral regurgitation, cardiomyopathy, and pacer dependent. He is now in AFib 100% of the time by pacer check. EF 45% in 2011.  Echo in 11/17 showed: Left ventricle: The cavity size was normal. There was mild concentric hypertrophy. Systolic function was mildly to moderately reduced. The estimated ejection fraction was in the range of 40% to 45%. Mild diffuse hypokinesis with no identifiable regional variations. The study is not technically sufficient to allow evaluation of LV diastolic function. - Ventricular septum: Septal motion showed paradox. These changes are consistent with right ventricular pacing. - Aortic valve: There was moderate regurgitation directed centrally in the LVOT. - Mitral valve: There was mild regurgitation directed centrally. - Left atrium: The atrium was severely dilated. - Right ventricle: The cavity size was mildly dilated. - Tricuspid valve: There was moderate regurgitation. - Pulmonary arteries: Systolic pressure was mildly increased. PA peak pressure: 39 mm Hg (S). - Pericardium, extracardiac: A small pericardial effusion was identified posterior to the heart. The fluid had no internal echoes.There was no evidence of hemodynamic compromise. There was a left pleural effusion.  In the past, he has had issues with falls, but this decreased with less Lasix. He uses a cane.  In 10/17, he required increased diuretics for LE edema and more SHOB.He was hospitalized in 11/17 and diuresed 3.6 L.   Mitral regurgitation had been noted to  be severe in the past.  Given his age, we discussed mitral valve intervention but he was not interested.  This seemed reasonable given his age and overall health status.  Had infection and urolgic surgery in 6/19.    Since the last visit, wife reports that he is slowing down and memory is decreasing.    Denies : Chest pain. Dizziness. Leg edema. Nitroglycerin use. Orthopnea. Palpitations. Paroxysmal nocturnal dyspnea. Shortness of breath. Syncope.   He has had issues with falling.  Taking iron.  No bleeding issues.   Past Medical History:  Diagnosis Date  . Anticoagulated on Coumadin   . Anxiety   . Aortic insufficiency    a. mod-severe by echo 2014. b. mod by echo 2017.  [name] Chronic anemia   . Chronic combined systolic and diastolic CHF (congestive heart failure) ([place])   . CKD (chronic kidney disease), stage III   . Coronary artery disease    a. s/p MI 1995.  [name] Hard of hearing   . Hypertension   . Hyponatremia   . Mitral regurgitation    a. prev severe, more recently mild in 2017 -> not felt to be candidate for any kind of surgical repair.  [name] Permanent atrial fibrillation ([place])   . Prostate cancer (Eddie Park) 2002  . Seizure disorder (Eddie Park)   . Tachycardia-bradycardia syndrome (Eddie Park)    a. w/ sycope - initial implant 2007, last gen change 2013 with BSX dual chamber PPM.    Past Surgical History:  Procedure Laterality Date  . artificial urinary sphincter    . CYSTOSCOPY N/A 03/07/2018   Procedure: CYSTOSCOPY FLEXIBLE;  Surgeon: Raynelle Bring, MD;  Location: [facility];  Service: Urology;  Laterality: N/A;  . HERNIA REPAIR  2002  . PACEMAKER INSERTION  04/29/06   Generator change (BSc) by Dr Rayann Heman 06/21/12  . PERMANENT PACEMAKER GENERATOR CHANGE N/A 06/08/2012   Procedure: PERMANENT PACEMAKER GENERATOR CHANGE;  Surgeon: Thompson Grayer, MD;  Location: [facility] CATH LAB;  Service: Cardiovascular;  Laterality: N/A;  . PROSTATE SURGERY    . PROSTATECTOMY  2002  . URINARY SPHINCTER IMPLANT N/A  03/07/2018   Procedure: ARTIFICIAL URINARY SPHINCTER REMOVAL;  Surgeon: Raynelle Bring, MD;  Location: [facility];  Service: Urology;  Laterality: N/A;     Current Outpatient Medications  Medication Sig Dispense Refill  . cholecalciferol (VITAMIN D) 1000 UNITS tablet Take 2,000 Units by mouth daily.    . ferrous sulfate 325 (65 FE) MG EC tablet Take 325 mg by mouth daily with breakfast.    . fish oil-omega-3 fatty acids 1000 MG capsule Take 2 g by mouth daily.    [name] levETIRAcetam (KEPPRA) 500 MG tablet Take 1 tablet (500 mg total) by mouth 2 (two) times daily. Please call [phone] to schedule yearly follow up for med refills. 180 tablet 4  . Multiple Vitamin (MULTIVITAMIN WITH MINERALS) TABS Take 1 tablet by mouth daily.    . sertraline (ZOLOFT) 100 MG tablet Take 100 mg by mouth daily.    [name] torsemide (DEMADEX) 20 MG tablet Take 1 tablet by mouth once daily 90 tablet 0  . vitamin B-12 (CYANOCOBALAMIN) 1000 MCG tablet Take 1,000 mcg by mouth every other day.     . warfarin (COUMADIN) 5 MG tablet Take as directed by Coumadin Clinic 120 tablet 1   No current facility-administered medications for this visit.    Allergies:   Patient has no known allergies.    Social History:  The patient  reports that he has never smoked. He has never used smokeless tobacco. He reports that he does not drink alcohol or use drugs.   Family History:  The patient's family history includes Colon polyps in his brother; Diabetes in his father; Heart attack in his brother, father, and mother; Heart disease in his father and mother.    ROS:  Please see the history of present illness.   Otherwise, review of systems are positive for occasional falls.   All other systems are reviewed and negative.    PHYSICAL EXAM: VS:  BP (!) 100/52   Pulse 67   Ht 5\' 6"  (1.676 m)   Wt 96 lb 12.8 oz (43.9 kg)   SpO2 98%   BMI 15.62 kg/m  , BMI Body mass index is 15.62 kg/m. GEN: Well nourished, well developed, in no acute distress  , frail HEENT: normal  Neck: no JVD, carotid bruits, or masses Cardiac: RRR; 1/6 systolic murmur, no rubs, or gallops,no edema  Respiratory:  clear to auscultation bilaterally, normal work of breathing GI: soft, nontender, nondistended, + BS MS: no deformity or atrophy ; bandage on left forearm due to recent fall Skin: warm and dry, no rash Neuro:  Strength and sensation are intact Psych: euthymic mood, full affect   EKG:   The ekg ordered 06/2019 demonstrates V paced rhythm   Recent Labs: 04/27/2019: Hemoglobin 10.1; Platelet Count 92   Lipid Panel    Component Value Date/Time   CHOL  09/26/2009 2344    142        ATP III CLASSIFICATION:  <200     mg/dL   Desirable  200-239  mg/dL   Borderline High  >=240    mg/dL   High          TRIG 65 09/26/2009 2344   HDL 42 09/26/2009 2344   CHOLHDL 3.4 09/26/2009 2344   VLDL 13 09/26/2009 2344   LDLCALC  09/26/2009 2344    87        Total Cholesterol/HDL:CHD Risk Coronary Heart Disease Risk Table                     Men   Women  1/2 Average Risk   3.4   3.3  Average Risk       5.0   4.4  2 X Average Risk   9.6   7.1  3 X Average Risk  23.4   11.0        Use the calculated Patient Ratio above and the CHD Risk Table to determine the patient's CHD Risk.        ATP III CLASSIFICATION (LDL):  <100     mg/dL   Optimal  100-129  mg/dL   Near or Above                    Optimal  130-159  mg/dL   Borderline  160-189  mg/dL   High  >190     mg/dL   Very High     Other studies Reviewed: Additional studies/ records that were reviewed today with results demonstrating: .   ASSESSMENT AND PLAN:  1. Atrial fibrillation: Coumadin for stroke prevention.  Rate controlled.  If falls become more of a problem, may need to reconsider Coumadin.  This was discussed with the family in the room.  The concern obviously is that his risk of stroke would increase since he is in atrial fibrillation.  We will continue anticoagulation for now. 2.  Hypertension: The current medical regimen is effective;  continue present plan and medications. 3. Chronic systolic heart failure: EF 40 to 45% in 2017. 4. Pacemaker: Followed with Dr. Rayann Heman.  5. Most important thing for him is to avoid falling.   Current medicines are reviewed at length with the patient today.  The patient concerns regarding his medicines were addressed.  The following changes have been made:  No change  Labs/ tests ordered today include:  No orders of the defined types were placed in this encounter.   Recommend 150 minutes/week of aerobic exercise Low fat, low carb, high fiber diet recommended  Disposition:   FU in 1 year   Signed, Larae Grooms, MD  11/29/2019 10:06 AM    [facility] [place], [place], [place]  [address] Phone: [phone]; Fax: [phone]

## 2019-11-29 ENCOUNTER — Other Ambulatory Visit: Payer: Self-pay

## 2019-11-29 ENCOUNTER — Ambulatory Visit (INDEPENDENT_AMBULATORY_CARE_PROVIDER_SITE_OTHER): Payer: Medicare Other | Admitting: *Deleted

## 2019-11-29 ENCOUNTER — Encounter: Payer: Self-pay | Admitting: Interventional Cardiology

## 2019-11-29 ENCOUNTER — Ambulatory Visit (INDEPENDENT_AMBULATORY_CARE_PROVIDER_SITE_OTHER): Payer: Medicare Other | Admitting: Interventional Cardiology

## 2019-11-29 VITALS — BP 100/52 | HR 67 | Ht 66.0 in | Wt 96.8 lb

## 2019-11-29 DIAGNOSIS — T798XXA Other early complications of trauma, initial encounter: Secondary | ICD-10-CM | POA: Diagnosis not present

## 2019-11-29 DIAGNOSIS — Z5181 Encounter for therapeutic drug level monitoring: Secondary | ICD-10-CM

## 2019-11-29 DIAGNOSIS — I340 Nonrheumatic mitral (valve) insufficiency: Secondary | ICD-10-CM

## 2019-11-29 DIAGNOSIS — I4821 Permanent atrial fibrillation: Secondary | ICD-10-CM | POA: Diagnosis not present

## 2019-11-29 DIAGNOSIS — I5032 Chronic diastolic (congestive) heart failure: Secondary | ICD-10-CM

## 2019-11-29 DIAGNOSIS — I4891 Unspecified atrial fibrillation: Secondary | ICD-10-CM | POA: Diagnosis not present

## 2019-11-29 DIAGNOSIS — I10 Essential (primary) hypertension: Secondary | ICD-10-CM

## 2019-11-29 DIAGNOSIS — Z7901 Long term (current) use of anticoagulants: Secondary | ICD-10-CM | POA: Diagnosis not present

## 2019-11-29 DIAGNOSIS — S61412A Laceration without foreign body of left hand, initial encounter: Secondary | ICD-10-CM | POA: Diagnosis not present

## 2019-11-29 LAB — POCT INR: INR: 3.1 — AB (ref 2.0–3.0)

## 2019-11-29 NOTE — Patient Instructions (Signed)

## 2019-11-29 NOTE — Patient Instructions (Signed)
Description    Take 1/2 a tablet today and then start taking 1 tablet daily.  Recheck INR in 3 weeks.  Call us with any concerns or medication changes # [phone] [facility], Main # [phone].

## 2019-12-03 DIAGNOSIS — T798XXD Other early complications of trauma, subsequent encounter: Secondary | ICD-10-CM | POA: Diagnosis not present

## 2019-12-05 ENCOUNTER — Ambulatory Visit (INDEPENDENT_AMBULATORY_CARE_PROVIDER_SITE_OTHER): Payer: Medicare Other | Admitting: *Deleted

## 2019-12-05 ENCOUNTER — Other Ambulatory Visit: Payer: Self-pay

## 2019-12-05 DIAGNOSIS — I4821 Permanent atrial fibrillation: Secondary | ICD-10-CM | POA: Diagnosis not present

## 2019-12-05 DIAGNOSIS — Z5181 Encounter for therapeutic drug level monitoring: Secondary | ICD-10-CM

## 2019-12-05 DIAGNOSIS — I4891 Unspecified atrial fibrillation: Secondary | ICD-10-CM | POA: Diagnosis not present

## 2019-12-05 DIAGNOSIS — Z7901 Long term (current) use of anticoagulants: Secondary | ICD-10-CM | POA: Diagnosis not present

## 2019-12-05 LAB — POCT INR: INR: 2.1 (ref 2.0–3.0)

## 2019-12-05 NOTE — Patient Instructions (Signed)
Description    Continue to take 1 tablet daily.  Recheck INR in 2 weeks. Pt warfarin manufacturer is changing 11/2019.  Call us with any concerns or medication changes # [phone] [facility], Main # [phone].

## 2019-12-06 DIAGNOSIS — T798XXD Other early complications of trauma, subsequent encounter: Secondary | ICD-10-CM | POA: Diagnosis not present

## 2019-12-06 DIAGNOSIS — S61412D Laceration without foreign body of left hand, subsequent encounter: Secondary | ICD-10-CM | POA: Diagnosis not present

## 2019-12-21 ENCOUNTER — Ambulatory Visit (INDEPENDENT_AMBULATORY_CARE_PROVIDER_SITE_OTHER): Payer: Medicare Other | Admitting: *Deleted

## 2019-12-21 ENCOUNTER — Other Ambulatory Visit: Payer: Self-pay

## 2019-12-21 DIAGNOSIS — Z5181 Encounter for therapeutic drug level monitoring: Secondary | ICD-10-CM | POA: Diagnosis not present

## 2019-12-21 DIAGNOSIS — I4821 Permanent atrial fibrillation: Secondary | ICD-10-CM | POA: Diagnosis not present

## 2019-12-21 DIAGNOSIS — I4891 Unspecified atrial fibrillation: Secondary | ICD-10-CM | POA: Diagnosis not present

## 2019-12-21 DIAGNOSIS — Z7901 Long term (current) use of anticoagulants: Secondary | ICD-10-CM

## 2019-12-21 LAB — POCT INR: INR: 2.7 (ref 2.0–3.0)

## 2019-12-21 NOTE — Patient Instructions (Signed)
Description    Continue to take 1 tablet daily.  Recheck INR in 5 weeks. Pt warfarin manufacturer is changing 11/2019.  Call us with any concerns or medication changes # [phone] [facility], Main # [phone].

## 2019-12-23 ENCOUNTER — Ambulatory Visit: Payer: Medicare Other | Attending: Internal Medicine

## 2019-12-23 DIAGNOSIS — Z23 Encounter for immunization: Secondary | ICD-10-CM

## 2019-12-23 NOTE — Progress Notes (Signed)
   Covid-19 Vaccination Clinic  Name:  COLTON TASSIN    MRN: [id number] DOB: 16-Sep-1927  12/23/2019  Mr. Sarwar was observed post Covid-19 immunization for 15 minutes without incident. He was provided with Vaccine Information Sheet and instruction to access the V-Safe system.   Mr. Razon was instructed to call 911 with any severe reactions post vaccine: [name] Difficulty breathing  . Swelling of face and throat  . A fast heartbeat  . A bad rash all over body  . Dizziness and weakness   Immunizations Administered    Name Date Dose VIS Date Route   Pfizer COVID-19 Vaccine 12/23/2019  9:40 AM 0.3 mL 09/02/2019 Intramuscular   Manufacturer: [employer]   Lot: [id number]   [place]: [id number]

## 2019-12-27 ENCOUNTER — Other Ambulatory Visit: Payer: Self-pay

## 2019-12-27 ENCOUNTER — Inpatient Hospital Stay: Payer: Medicare Other | Attending: Oncology | Admitting: Oncology

## 2019-12-27 ENCOUNTER — Inpatient Hospital Stay: Payer: Medicare Other

## 2019-12-27 VITALS — BP 109/48 | HR 69 | Temp 99.1°F | Resp 17 | Ht 66.0 in | Wt 95.4 lb

## 2019-12-27 DIAGNOSIS — D696 Thrombocytopenia, unspecified: Secondary | ICD-10-CM | POA: Diagnosis not present

## 2019-12-27 DIAGNOSIS — N189 Chronic kidney disease, unspecified: Secondary | ICD-10-CM | POA: Diagnosis not present

## 2019-12-27 DIAGNOSIS — K5900 Constipation, unspecified: Secondary | ICD-10-CM | POA: Diagnosis not present

## 2019-12-27 DIAGNOSIS — D469 Myelodysplastic syndrome, unspecified: Secondary | ICD-10-CM | POA: Diagnosis not present

## 2019-12-27 DIAGNOSIS — D649 Anemia, unspecified: Secondary | ICD-10-CM

## 2019-12-27 DIAGNOSIS — D509 Iron deficiency anemia, unspecified: Secondary | ICD-10-CM | POA: Insufficient documentation

## 2019-12-27 DIAGNOSIS — Z7901 Long term (current) use of anticoagulants: Secondary | ICD-10-CM | POA: Insufficient documentation

## 2019-12-27 DIAGNOSIS — Z79899 Other long term (current) drug therapy: Secondary | ICD-10-CM | POA: Diagnosis not present

## 2019-12-27 LAB — IRON AND TIBC
Iron: 62 ug/dL (ref 42–163)
Saturation Ratios: 24 % (ref 20–55)
TIBC: 260 ug/dL (ref 202–409)
UIBC: 198 ug/dL (ref 117–376)

## 2019-12-27 LAB — CBC WITH DIFFERENTIAL (CANCER CENTER ONLY)
Abs Immature Granulocytes: 0.01 10*3/uL (ref 0.00–0.07)
Basophils Absolute: 0 10*3/uL (ref 0.0–0.1)
Basophils Relative: 0 %
Eosinophils Absolute: 0.1 10*3/uL (ref 0.0–0.5)
Eosinophils Relative: 2 %
HCT: 30.3 % — ABNORMAL LOW (ref 39.0–52.0)
Hemoglobin: 9.6 g/dL — ABNORMAL LOW (ref 13.0–17.0)
Immature Granulocytes: 0 %
Lymphocytes Relative: 24 %
Lymphs Abs: 1 10*3/uL (ref 0.7–4.0)
MCH: 31.5 pg (ref 26.0–34.0)
MCHC: 31.7 g/dL (ref 30.0–36.0)
MCV: 99.3 fL (ref 80.0–100.0)
Monocytes Absolute: 0.5 10*3/uL (ref 0.1–1.0)
Monocytes Relative: 12 %
Neutro Abs: 2.4 10*3/uL (ref 1.7–7.7)
Neutrophils Relative %: 62 %
Platelet Count: 96 10*3/uL — ABNORMAL LOW (ref 150–400)
RBC: 3.05 MIL/uL — ABNORMAL LOW (ref 4.22–5.81)
RDW: 12.4 % (ref 11.5–15.5)
WBC Count: 4 10*3/uL (ref 4.0–10.5)
nRBC: 0 % (ref 0.0–0.2)

## 2019-12-27 LAB — FERRITIN: Ferritin: 88 ng/mL (ref 24–336)

## 2019-12-27 NOTE — Progress Notes (Signed)
Hematology and Oncology Follow Up Visit  KENRY DAUBERT [id number] 1927-04-08 86 y.o. 12/27/2019 1:05 PM Leighton Ruff, MDBarnes, Benjamine Mola, MD   Principle Diagnosis: 86 year old man with anemia diagnosed in June 2019.  His anemia is related to iron deficiency and chronic renal insufficiency.   Current therapy: Oral iron replacement on a daily basis.   Interim History: Mr. Penick is here for return evaluation.  Since the last visit, he reports no major changes in his health.  He denies any recent hospitalizations or illnesses.  He denies any excessive fatigue or tiredness.  He continues to take oral iron therapy without any abdominal pain.  Does report occasional constipation.              Medications: Unchanged on review. Current Outpatient Medications  Medication Sig Dispense Refill  . cholecalciferol (VITAMIN D) 1000 UNITS tablet Take 2,000 Units by mouth daily.    . ferrous sulfate 325 (65 FE) MG EC tablet Take 325 mg by mouth daily with breakfast.    . fish oil-omega-3 fatty acids 1000 MG capsule Take 2 g by mouth daily.    [name] levETIRAcetam (KEPPRA) 500 MG tablet Take 1 tablet (500 mg total) by mouth 2 (two) times daily. Please call [phone] to schedule yearly follow up for med refills. 180 tablet 4  . Multiple Vitamin (MULTIVITAMIN WITH MINERALS) TABS Take 1 tablet by mouth daily.    . sertraline (ZOLOFT) 100 MG tablet Take 100 mg by mouth daily.    [name] torsemide (DEMADEX) 20 MG tablet Take 1 tablet by mouth once daily 90 tablet 0  . vitamin B-12 (CYANOCOBALAMIN) 1000 MCG tablet Take 1,000 mcg by mouth every other day.     . warfarin (COUMADIN) 5 MG tablet Take as directed by Coumadin Clinic 120 tablet 1   No current facility-administered medications for this visit.     Allergies: No Known Allergies      Physical Exam:  Blood pressure (!) 109/48, pulse 69, temperature 99.1 F (37.3 C), temperature source Temporal, resp. rate 17, height 5\' 6"  (1.676 m), weight  95 lb 6.4 oz (43.3 kg), SpO2 96 %.    ECOG: 1   General appearance: Alert, awake without any distress. Head: Atraumatic without abnormalities Oropharynx: Without any thrush or ulcers. Eyes: No scleral icterus. Lymph nodes: No lymphadenopathy noted in the cervical, supraclavicular, or axillary nodes Heart:regular rate and rhythm, without any murmurs or gallops.   Lung: Clear to auscultation without any rhonchi, wheezes or dullness to percussion. Abdomin: Soft, nontender without any shifting dullness or ascites. Musculoskeletal: No clubbing or cyanosis. Neurological: No motor or sensory deficits. Skin: No rashes or lesions.       Lab Results: Lab Results  Component Value Date   WBC 5.3 04/27/2019   HGB 10.1 (L) 04/27/2019   HCT 30.9 (L) 04/27/2019   MCV 97.5 04/27/2019   PLT 92 (L) 04/27/2019     Chemistry      Component Value Date/Time   NA 140 10/27/2018 1427   NA 136 05/21/2017 1609   K 4.2 10/27/2018 1427   CL 100 10/27/2018 1427   CO2 31 10/27/2018 1427   BUN 42 (H) 10/27/2018 1427   BUN 35 (H) 05/21/2017 1609   CREATININE 1.79 (H) 10/27/2018 1427   CREATININE 1.67 (H) 08/12/2016 1527      Component Value Date/Time   CALCIUM 9.1 10/27/2018 1427   ALKPHOS 83 10/27/2018 1427   AST 22 10/27/2018 1427   ALT 14 10/27/2018 1427  BILITOT 0.5 10/27/2018 1427         Impression and Plan:  86 year old man with:  1.  Multifactorial anemia related to chronically insufficiency and iron deficiency.    Differential diagnosis was reiterated again at this time.  Anemia related to myelodysplasia could also be contributing at this time.  Treatment options were reviewed which including continued oral iron supplementation, intravenous iron, growth factor support and packed red cell transfusion were also discussed.  Hemoglobin today is 9.6 and close to his baseline.  No intervention is needed we will continue to monitor.  2.  Thrombocytopenia: Remains overall mild  and asymptomatic.  Etiology is unclear could be related to early myelodysplasia.  No active bleeding noted with a platelet count is close to baseline.   3.  Follow-up: In 8 months for repeat evaluation.  20  minutes were dedicated to this visit.  The time was spent on reviewing laboratory data, discussing differential diagnosis and management options.     Zola Button, MD 4/6/20211:05 PM

## 2019-12-28 ENCOUNTER — Telehealth: Payer: Self-pay | Admitting: Oncology

## 2019-12-28 ENCOUNTER — Ambulatory Visit (INDEPENDENT_AMBULATORY_CARE_PROVIDER_SITE_OTHER): Payer: Medicare Other | Admitting: *Deleted

## 2019-12-28 DIAGNOSIS — I495 Sick sinus syndrome: Secondary | ICD-10-CM

## 2019-12-28 LAB — CUP PACEART REMOTE DEVICE CHECK
Battery Remaining Longevity: 60 mo
Battery Remaining Percentage: 64 %
Brady Statistic RA Percent Paced: 0 %
Brady Statistic RV Percent Paced: 98 %
Date Time Interrogation Session: 20210407004100
Implantable Lead Implant Date: 20130917
Implantable Lead Implant Date: 20130917
Implantable Lead Location: 753859
Implantable Lead Location: 753860
Implantable Lead Model: 4456
Implantable Lead Model: 4469
Implantable Lead Serial Number: 448722
Implantable Lead Serial Number: 484382
Implantable Pulse Generator Implant Date: 20130917
Lead Channel Impedance Value: 375 Ohm
Lead Channel Impedance Value: 385 Ohm
Lead Channel Setting Pacing Amplitude: 2.4 V
Lead Channel Setting Pacing Pulse Width: 0.4 ms
Lead Channel Setting Sensing Sensitivity: 2.5 mV
Pulse Gen Serial Number: 111635

## 2019-12-28 NOTE — Telephone Encounter (Signed)
Scheduled appt per 4/6 los.  Printed and mailed appt calendar

## 2019-12-28 NOTE — Progress Notes (Signed)
PPM Remote  

## 2020-01-09 DIAGNOSIS — I2510 Atherosclerotic heart disease of native coronary artery without angina pectoris: Secondary | ICD-10-CM | POA: Diagnosis not present

## 2020-01-09 DIAGNOSIS — N183 Chronic kidney disease, stage 3 unspecified: Secondary | ICD-10-CM | POA: Diagnosis not present

## 2020-01-09 DIAGNOSIS — F339 Major depressive disorder, recurrent, unspecified: Secondary | ICD-10-CM | POA: Diagnosis not present

## 2020-01-09 DIAGNOSIS — F0390 Unspecified dementia without behavioral disturbance: Secondary | ICD-10-CM | POA: Diagnosis not present

## 2020-01-09 DIAGNOSIS — Z8546 Personal history of malignant neoplasm of prostate: Secondary | ICD-10-CM | POA: Diagnosis not present

## 2020-01-09 DIAGNOSIS — D631 Anemia in chronic kidney disease: Secondary | ICD-10-CM | POA: Diagnosis not present

## 2020-01-09 DIAGNOSIS — E7800 Pure hypercholesterolemia, unspecified: Secondary | ICD-10-CM | POA: Diagnosis not present

## 2020-01-09 DIAGNOSIS — I5032 Chronic diastolic (congestive) heart failure: Secondary | ICD-10-CM | POA: Diagnosis not present

## 2020-01-18 ENCOUNTER — Ambulatory Visit: Payer: Medicare Other | Attending: Internal Medicine

## 2020-01-18 DIAGNOSIS — Z23 Encounter for immunization: Secondary | ICD-10-CM

## 2020-01-18 NOTE — Progress Notes (Signed)
   Covid-19 Vaccination Clinic  Name:  Eddie Park    MRN: [id number] DOB: 1927/07/05  01/18/2020  [name] was observed post Covid-19 immunization for 15 minutes without incident. He was provided with Vaccine Information Sheet and instruction to access the V-Safe system.   [name] was instructed to call 911 with any severe reactions post vaccine: [name] Difficulty breathing  . Swelling of face and throat  . A fast heartbeat  . A bad rash all over body  . Dizziness and weakness   Immunizations Administered    Name Date Dose VIS Date Route   Pfizer COVID-19 Vaccine 01/18/2020  9:32 AM 0.3 mL 11/16/2018 Intramuscular   Manufacturer: [place]   Lot: [id number]   [place]: [id number]

## 2020-01-25 ENCOUNTER — Other Ambulatory Visit: Payer: Self-pay

## 2020-01-25 ENCOUNTER — Ambulatory Visit (INDEPENDENT_AMBULATORY_CARE_PROVIDER_SITE_OTHER): Payer: Medicare Other

## 2020-01-25 DIAGNOSIS — I4891 Unspecified atrial fibrillation: Secondary | ICD-10-CM | POA: Diagnosis not present

## 2020-01-25 DIAGNOSIS — Z7901 Long term (current) use of anticoagulants: Secondary | ICD-10-CM

## 2020-01-25 DIAGNOSIS — I4821 Permanent atrial fibrillation: Secondary | ICD-10-CM

## 2020-01-25 DIAGNOSIS — Z5181 Encounter for therapeutic drug level monitoring: Secondary | ICD-10-CM | POA: Diagnosis not present

## 2020-01-25 LAB — POCT INR: INR: 2.3 (ref 2.0–3.0)

## 2020-01-25 NOTE — Patient Instructions (Signed)
Description    Continue to take 1 tablet daily.  Recheck INR in 6 weeks.  Call us with any concerns or medication changes # [phone] [facility]in Clinic, Main # [phone].

## 2020-01-26 DIAGNOSIS — E0590 Thyrotoxicosis, unspecified without thyrotoxic crisis or storm: Secondary | ICD-10-CM | POA: Diagnosis not present

## 2020-01-29 ENCOUNTER — Other Ambulatory Visit: Payer: Self-pay | Admitting: Interventional Cardiology

## 2020-03-07 ENCOUNTER — Telehealth: Payer: Self-pay

## 2020-03-07 ENCOUNTER — Other Ambulatory Visit: Payer: Self-pay

## 2020-03-07 ENCOUNTER — Ambulatory Visit (INDEPENDENT_AMBULATORY_CARE_PROVIDER_SITE_OTHER): Payer: Medicare Other | Admitting: *Deleted

## 2020-03-07 DIAGNOSIS — Z5181 Encounter for therapeutic drug level monitoring: Secondary | ICD-10-CM | POA: Diagnosis not present

## 2020-03-07 DIAGNOSIS — I4891 Unspecified atrial fibrillation: Secondary | ICD-10-CM

## 2020-03-07 DIAGNOSIS — Z7901 Long term (current) use of anticoagulants: Secondary | ICD-10-CM

## 2020-03-07 DIAGNOSIS — I4821 Permanent atrial fibrillation: Secondary | ICD-10-CM

## 2020-03-07 LAB — POCT INR: INR: 1.9 — AB (ref 2.0–3.0)

## 2020-03-07 NOTE — Patient Instructions (Signed)
Description   Today take 1.5 tablets then continue to take 1 tablet daily.  Recheck INR in 5 weeks.  Call us with any concerns or medication changes # [phone] [facility]in Clinic, Main # [phone].

## 2020-03-07 NOTE — Telephone Encounter (Signed)
-----   Message from Marcos Eke, RN sent at 03/07/2020 10:45 AM EDT ----- Good Morning!  I saw this pt and his wife this morning in the Anticoagulation Clinic. The wife stated that they needed the handicap parking placard renewed for the pt and that Dr. Irish Lack normally takes care of it. I asked for the form that most people bring and she had an envelope with the guidelines and her current placard that expires 04/2020, but no forms. Therefore, she asked if I could send you a  message to you to see if you could reach out to her regarding this once you have the time.  Have a great day and Thanks.

## 2020-03-07 NOTE — Telephone Encounter (Signed)
Called and spoke to patient and made him aware that Dr. Irish Lack will be back in the office next week and we will complete the handicap placard forma dn mail to him once complete. He verbalized understanding and thanked me for the call.

## 2020-03-12 NOTE — Telephone Encounter (Signed)
Form completed and placed in the mail.

## 2020-03-27 ENCOUNTER — Other Ambulatory Visit: Payer: Self-pay | Admitting: Interventional Cardiology

## 2020-03-28 ENCOUNTER — Ambulatory Visit (INDEPENDENT_AMBULATORY_CARE_PROVIDER_SITE_OTHER): Payer: Medicare Other | Admitting: *Deleted

## 2020-03-28 DIAGNOSIS — I495 Sick sinus syndrome: Secondary | ICD-10-CM

## 2020-03-28 LAB — CUP PACEART REMOTE DEVICE CHECK
Battery Remaining Longevity: 54 mo
Battery Remaining Percentage: 61 %
Brady Statistic RA Percent Paced: 0 %
Brady Statistic RV Percent Paced: 97 %
Date Time Interrogation Session: 20210707004100
Implantable Lead Implant Date: 20130917
Implantable Lead Implant Date: 20130917
Implantable Lead Location: 753859
Implantable Lead Location: 753860
Implantable Lead Model: 4456
Implantable Lead Model: 4469
Implantable Lead Serial Number: 448722
Implantable Lead Serial Number: 484382
Implantable Pulse Generator Implant Date: 20130917
Lead Channel Impedance Value: 375 Ohm
Lead Channel Impedance Value: 394 Ohm
Lead Channel Setting Pacing Amplitude: 2.4 V
Lead Channel Setting Pacing Pulse Width: 0.4 ms
Lead Channel Setting Sensing Sensitivity: 2.5 mV
Pulse Gen Serial Number: 111635

## 2020-03-29 NOTE — Progress Notes (Signed)
Remote pacemaker transmission.   

## 2020-04-11 ENCOUNTER — Ambulatory Visit (INDEPENDENT_AMBULATORY_CARE_PROVIDER_SITE_OTHER): Payer: Medicare Other | Admitting: *Deleted

## 2020-04-11 ENCOUNTER — Other Ambulatory Visit: Payer: Self-pay

## 2020-04-11 DIAGNOSIS — Z5181 Encounter for therapeutic drug level monitoring: Secondary | ICD-10-CM | POA: Diagnosis not present

## 2020-04-11 DIAGNOSIS — I4821 Permanent atrial fibrillation: Secondary | ICD-10-CM

## 2020-04-11 DIAGNOSIS — Z7901 Long term (current) use of anticoagulants: Secondary | ICD-10-CM

## 2020-04-11 DIAGNOSIS — I4891 Unspecified atrial fibrillation: Secondary | ICD-10-CM | POA: Diagnosis not present

## 2020-04-11 LAB — POCT INR: INR: 2.5 (ref 2.0–3.0)

## 2020-04-11 NOTE — Patient Instructions (Signed)
Description   Continue to take 1 tablet daily.  Recheck INR in 6 weeks.  Call us with any concerns or medication changes # [phone] Coumadin Clinic, Main # [phone].

## 2020-05-23 ENCOUNTER — Other Ambulatory Visit: Payer: Self-pay

## 2020-05-23 ENCOUNTER — Ambulatory Visit (INDEPENDENT_AMBULATORY_CARE_PROVIDER_SITE_OTHER): Payer: Medicare Other | Admitting: *Deleted

## 2020-05-23 DIAGNOSIS — I4891 Unspecified atrial fibrillation: Secondary | ICD-10-CM

## 2020-05-23 DIAGNOSIS — Z5181 Encounter for therapeutic drug level monitoring: Secondary | ICD-10-CM

## 2020-05-23 DIAGNOSIS — I4821 Permanent atrial fibrillation: Secondary | ICD-10-CM

## 2020-05-23 DIAGNOSIS — Z7901 Long term (current) use of anticoagulants: Secondary | ICD-10-CM

## 2020-05-23 LAB — POCT INR: INR: 2.8 (ref 2.0–3.0)

## 2020-05-23 NOTE — Patient Instructions (Signed)
Description   Continue to take 1 tablet daily.  Recheck INR in 6 weeks.  Call us with any concerns or medication changes # [phone] [facility]madin Clinic, Main # [phone].

## 2020-05-28 ENCOUNTER — Other Ambulatory Visit: Payer: Self-pay | Admitting: Neurology

## 2020-06-13 DIAGNOSIS — L814 Other melanin hyperpigmentation: Secondary | ICD-10-CM | POA: Diagnosis not present

## 2020-06-13 DIAGNOSIS — L570 Actinic keratosis: Secondary | ICD-10-CM | POA: Diagnosis not present

## 2020-06-13 DIAGNOSIS — D225 Melanocytic nevi of trunk: Secondary | ICD-10-CM | POA: Diagnosis not present

## 2020-06-13 DIAGNOSIS — L821 Other seborrheic keratosis: Secondary | ICD-10-CM | POA: Diagnosis not present

## 2020-06-13 DIAGNOSIS — Z85828 Personal history of other malignant neoplasm of skin: Secondary | ICD-10-CM | POA: Diagnosis not present

## 2020-06-13 DIAGNOSIS — L578 Other skin changes due to chronic exposure to nonionizing radiation: Secondary | ICD-10-CM | POA: Diagnosis not present

## 2020-06-27 ENCOUNTER — Ambulatory Visit (INDEPENDENT_AMBULATORY_CARE_PROVIDER_SITE_OTHER): Payer: Medicare Other

## 2020-06-27 DIAGNOSIS — I495 Sick sinus syndrome: Secondary | ICD-10-CM

## 2020-06-27 LAB — CUP PACEART REMOTE DEVICE CHECK
Battery Remaining Longevity: 54 mo
Battery Remaining Percentage: 57 %
Brady Statistic RA Percent Paced: 0 %
Brady Statistic RV Percent Paced: 95 %
Date Time Interrogation Session: 20211006004100
Implantable Lead Implant Date: 20130917
Implantable Lead Implant Date: 20130917
Implantable Lead Location: 753859
Implantable Lead Location: 753860
Implantable Lead Model: 4456
Implantable Lead Model: 4469
Implantable Lead Serial Number: 448722
Implantable Lead Serial Number: 484382
Implantable Pulse Generator Implant Date: 20130917
Lead Channel Impedance Value: 367 Ohm
Lead Channel Impedance Value: 380 Ohm
Lead Channel Setting Pacing Amplitude: 2.4 V
Lead Channel Setting Pacing Pulse Width: 0.4 ms
Lead Channel Setting Sensing Sensitivity: 2.5 mV
Pulse Gen Serial Number: 111635

## 2020-07-02 NOTE — Progress Notes (Signed)
Remote pacemaker transmission.   

## 2020-07-03 ENCOUNTER — Other Ambulatory Visit: Payer: Self-pay | Admitting: Interventional Cardiology

## 2020-07-06 ENCOUNTER — Other Ambulatory Visit: Payer: Self-pay

## 2020-07-06 ENCOUNTER — Ambulatory Visit (INDEPENDENT_AMBULATORY_CARE_PROVIDER_SITE_OTHER): Payer: Medicare Other | Admitting: *Deleted

## 2020-07-06 ENCOUNTER — Ambulatory Visit (INDEPENDENT_AMBULATORY_CARE_PROVIDER_SITE_OTHER): Payer: Medicare Other | Admitting: Internal Medicine

## 2020-07-06 ENCOUNTER — Encounter: Payer: Self-pay | Admitting: Internal Medicine

## 2020-07-06 VITALS — BP 110/44 | HR 68 | Ht 66.0 in | Wt 95.6 lb

## 2020-07-06 DIAGNOSIS — Z5181 Encounter for therapeutic drug level monitoring: Secondary | ICD-10-CM | POA: Diagnosis not present

## 2020-07-06 DIAGNOSIS — I442 Atrioventricular block, complete: Secondary | ICD-10-CM | POA: Diagnosis not present

## 2020-07-06 DIAGNOSIS — I5042 Chronic combined systolic (congestive) and diastolic (congestive) heart failure: Secondary | ICD-10-CM | POA: Diagnosis not present

## 2020-07-06 DIAGNOSIS — I4821 Permanent atrial fibrillation: Secondary | ICD-10-CM

## 2020-07-06 DIAGNOSIS — I10 Essential (primary) hypertension: Secondary | ICD-10-CM | POA: Diagnosis not present

## 2020-07-06 DIAGNOSIS — Z7901 Long term (current) use of anticoagulants: Secondary | ICD-10-CM | POA: Diagnosis not present

## 2020-07-06 DIAGNOSIS — I4891 Unspecified atrial fibrillation: Secondary | ICD-10-CM

## 2020-07-06 LAB — POCT INR: INR: 2.9 (ref 2.0–3.0)

## 2020-07-06 NOTE — Progress Notes (Signed)
PCP: Leighton Ruff, MD Primary Cardiologist: Dr Irish Lack Primary EP:  Dr Rayann Heman  Eddie Park is a 86 y.o. male who presents today for routine electrophysiology followup.  Since last being seen in our clinic, the patient reports doing very well.  Today, he denies symptoms of palpitations, chest pain, shortness of breath,  lower extremity edema, dizziness, presyncope, or syncope.  The patient is otherwise without complaint today.   Past Medical History:  Diagnosis Date  . Anticoagulated on Coumadin   . Anxiety   . Aortic insufficiency    a. mod-severe by echo 2014. b. mod by echo 2017.  [name] Chronic anemia   . Chronic combined systolic and diastolic CHF (congestive heart failure) ([place])   . CKD (chronic kidney disease), stage III ([place])   . Coronary artery disease    a. s/p MI 1995.  [name] Hard of hearing   . Hypertension   . Hyponatremia   . Mitral regurgitation    a. prev severe, more recently mild in 2017 -> not felt to be candidate for any kind of surgical repair.  [name] Permanent atrial fibrillation ([place])   . Prostate cancer ([place]) 2002  . Seizure disorder ([place])   . Tachycardia-bradycardia syndrome ([place])    a. w/ sycope - initial implant 2007, last gen change 2013 with BSX dual chamber PPM.   Past Surgical History:  Procedure Laterality Date  . artificial urinary sphincter    . CYSTOSCOPY N/A 03/07/2018   Procedure: CYSTOSCOPY FLEXIBLE;  Surgeon: Raynelle Bring, MD;  Location: [facility];  Service: Urology;  Laterality: N/A;  . HERNIA REPAIR  2002  . PACEMAKER INSERTION  04/29/06   Generator change (BSc) by Dr Rayann Heman 06/21/12  . PERMANENT PACEMAKER GENERATOR CHANGE N/A 06/08/2012   Procedure: PERMANENT PACEMAKER GENERATOR CHANGE;  Surgeon: Thompson Grayer, MD;  Location: [facility] CATH LAB;  Service: Cardiovascular;  Laterality: N/A;  . PROSTATE SURGERY    . PROSTATECTOMY  2002  . URINARY SPHINCTER IMPLANT N/A 03/07/2018   Procedure: ARTIFICIAL URINARY SPHINCTER REMOVAL;  Surgeon: Raynelle Bring, MD;  Location: [facility];  Service: Urology;  Laterality: N/A;    ROS- all systems are reviewed and negative except as per HPI above  Current Outpatient Medications  Medication Sig Dispense Refill  . cholecalciferol (VITAMIN D) 1000 UNITS tablet Take 2,000 Units by mouth daily.    . ferrous sulfate 325 (65 FE) MG EC tablet Take 325 mg by mouth daily with breakfast.    . fish oil-omega-3 fatty acids 1000 MG capsule Take 2 g by mouth daily.    [name] levETIRAcetam (KEPPRA) 500 MG tablet Take 1 tablet (500 mg total) by mouth 2 (two) times daily. Please request future refills from PCP. 180 tablet 0  . Multiple Vitamin (MULTIVITAMIN WITH MINERALS) TABS Take 1 tablet by mouth daily.    . sertraline (ZOLOFT) 100 MG tablet Take 100 mg by mouth daily.    [name] torsemide (DEMADEX) 20 MG tablet Take 1 tablet by mouth once daily 90 tablet 3  . vitamin B-12 (CYANOCOBALAMIN) 1000 MCG tablet Take 1,000 mcg by mouth every other day.     . warfarin (COUMADIN) 5 MG tablet TAKE BY MOUTH AS DIRECTED BY COUMADIN CLINIC 100 tablet 1   No current facility-administered medications for this visit.    Physical Exam: Vitals:   07/06/20 0956  BP: (!) 110/44  Pulse: 68  SpO2: 93%  Weight: 95 lb 9.6 oz (43.4 kg)  Height: 5\' 6"  (1.676 m)  GEN- The patient is elderly appearing, alert and oriented x 3 today.   Head- normocephalic, atraumatic Eyes-  Sclera clear, conjunctiva pink Ears- hearing intact Oropharynx- clear Lungs-  normal work of breathing Chest- pacemaker pocket is well healed Heart- Regular rate and rhythm (paced) GI- soft,  Extremities- no clubbing, cyanosis, or edema  Pacemaker interrogation- reviewed in detail today,  See PACEART report  ekg tracing ordered today is personally reviewed and shows afib, V paced, PVCs  Assessment and Plan:  1. Symptomatic complete heart block Normal pacemaker function See Pace Art report No changes today he is device dependant today  2. Permanent  afib Rate controlled On coumadin for chads2vasc score of 5. His wife feels that his ambulation is stable and that he is ok to continue coumadin. Given advanced age, I would not advise Watchman for this patient  3. HTN Stable No change required today  4. Chronic systolic and diastolic dysfunction Stable No change required today  5. Moderate MR Followed by Dr Irish Lack  Risks, benefits and potential toxicities for medications prescribed and/or refilled reviewed with patient today.   Return to see EP PA in a year  Thompson Grayer MD, [facility] 07/06/2020 10:11 AM

## 2020-07-06 NOTE — Patient Instructions (Signed)
Description   Continue to take 1 tablet daily.  Recheck INR in 6 weeks.  Call us with any concerns or medication changes # [phone] Coumadin Clinic, Main # [phone].

## 2020-07-06 NOTE — Patient Instructions (Addendum)
Medication Instructions:  Your physician recommends that you continue on your current medications as directed. Please refer to the Current Medication list given to you today.  *If you need a refill on your cardiac medications before your next appointment, please call your pharmacy*  Lab Work: None ordered.  If you have labs (blood work) drawn today and your tests are completely normal, you will receive your results only by: [name] MyChart Message (if you have MyChart) OR . A paper copy in the mail If you have any lab test that is abnormal or we need to change your treatment, we will call you to review the results.  Testing/Procedures: None ordered.  Follow-Up: At [facility], you and your health needs are our priority.  As part of our continuing mission to provide you with exceptional heart care, we have created designated Provider Care Teams.  These Care Teams include your primary Cardiologist (physician) and Advanced Practice Providers (APPs -  Physician Assistants and Nurse Practitioners) who all work together to provide you with the care you need, when you need it.  We recommend signing up for the patient portal called "MyChart".  Sign up information is provided on this After Visit Summary.  MyChart is used to connect with patients for Virtual Visits (Telemedicine).  Patients are able to view lab/test results, encounter notes, upcoming appointments, etc.  Non-urgent messages can be sent to your provider as well.   To learn more about what you can do with MyChart, go to [email or web address].    Your next appointment:   Your physician wants you to follow-up in: 1 year with Dr. Rayann Heman. You will receive a reminder letter in the mail two months in advance. If you don't receive a letter, please call our office to schedule the follow-up appointment.  Remote monitoring is used to monitor your Pacemaker from home. This monitoring reduces the number of office visits required to check your device  to one time per year. It allows [place] to keep an eye on the functioning of your device to ensure it is working properly. You are scheduled for a device check from home on 09/26/2020. You may send your transmission at any time that day. If you have a wireless device, the transmission will be sent automatically. After your physician reviews your transmission, you will receive a postcard with your next transmission date.  Other Instructions:

## 2020-07-10 DIAGNOSIS — I2510 Atherosclerotic heart disease of native coronary artery without angina pectoris: Secondary | ICD-10-CM | POA: Diagnosis not present

## 2020-07-10 DIAGNOSIS — F339 Major depressive disorder, recurrent, unspecified: Secondary | ICD-10-CM | POA: Diagnosis not present

## 2020-07-10 DIAGNOSIS — F0390 Unspecified dementia without behavioral disturbance: Secondary | ICD-10-CM | POA: Diagnosis not present

## 2020-07-10 DIAGNOSIS — I5032 Chronic diastolic (congestive) heart failure: Secondary | ICD-10-CM | POA: Diagnosis not present

## 2020-07-10 DIAGNOSIS — E7800 Pure hypercholesterolemia, unspecified: Secondary | ICD-10-CM | POA: Diagnosis not present

## 2020-07-10 DIAGNOSIS — Z8546 Personal history of malignant neoplasm of prostate: Secondary | ICD-10-CM | POA: Diagnosis not present

## 2020-07-10 DIAGNOSIS — I4891 Unspecified atrial fibrillation: Secondary | ICD-10-CM | POA: Diagnosis not present

## 2020-07-10 DIAGNOSIS — Z23 Encounter for immunization: Secondary | ICD-10-CM | POA: Diagnosis not present

## 2020-07-10 DIAGNOSIS — N183 Chronic kidney disease, stage 3 unspecified: Secondary | ICD-10-CM | POA: Diagnosis not present

## 2020-08-22 ENCOUNTER — Other Ambulatory Visit: Payer: Self-pay

## 2020-08-22 ENCOUNTER — Ambulatory Visit (INDEPENDENT_AMBULATORY_CARE_PROVIDER_SITE_OTHER): Payer: Medicare Other | Admitting: *Deleted

## 2020-08-22 DIAGNOSIS — I4821 Permanent atrial fibrillation: Secondary | ICD-10-CM

## 2020-08-22 DIAGNOSIS — Z5181 Encounter for therapeutic drug level monitoring: Secondary | ICD-10-CM | POA: Diagnosis not present

## 2020-08-22 DIAGNOSIS — Z7901 Long term (current) use of anticoagulants: Secondary | ICD-10-CM

## 2020-08-22 DIAGNOSIS — I4891 Unspecified atrial fibrillation: Secondary | ICD-10-CM | POA: Diagnosis not present

## 2020-08-22 LAB — POCT INR: INR: 3.3 — AB (ref 2.0–3.0)

## 2020-08-22 NOTE — Patient Instructions (Signed)
Description   Hold today's dose then continue to take 1 tablet daily. Recheck INR in 4 weeks.  Call us with any concerns or medication changes # [phone] Cou[facility]in Clinic, Main # [phone].

## 2020-08-25 ENCOUNTER — Other Ambulatory Visit: Payer: Self-pay | Admitting: Neurology

## 2020-08-28 ENCOUNTER — Other Ambulatory Visit: Payer: Self-pay

## 2020-08-28 ENCOUNTER — Other Ambulatory Visit: Payer: Self-pay | Admitting: Oncology

## 2020-08-28 ENCOUNTER — Inpatient Hospital Stay: Payer: Medicare Other

## 2020-08-28 ENCOUNTER — Inpatient Hospital Stay: Payer: Medicare Other | Attending: Oncology | Admitting: Oncology

## 2020-08-28 VITALS — BP 103/40 | HR 57 | Temp 96.5°F | Resp 16 | Ht 66.0 in | Wt 100.7 lb

## 2020-08-28 DIAGNOSIS — D696 Thrombocytopenia, unspecified: Secondary | ICD-10-CM | POA: Diagnosis not present

## 2020-08-28 DIAGNOSIS — Z7901 Long term (current) use of anticoagulants: Secondary | ICD-10-CM | POA: Diagnosis not present

## 2020-08-28 DIAGNOSIS — D631 Anemia in chronic kidney disease: Secondary | ICD-10-CM | POA: Diagnosis not present

## 2020-08-28 DIAGNOSIS — D649 Anemia, unspecified: Secondary | ICD-10-CM

## 2020-08-28 DIAGNOSIS — N189 Chronic kidney disease, unspecified: Secondary | ICD-10-CM | POA: Diagnosis not present

## 2020-08-28 DIAGNOSIS — Z79899 Other long term (current) drug therapy: Secondary | ICD-10-CM | POA: Insufficient documentation

## 2020-08-28 DIAGNOSIS — E611 Iron deficiency: Secondary | ICD-10-CM | POA: Insufficient documentation

## 2020-08-28 LAB — CBC WITH DIFFERENTIAL (CANCER CENTER ONLY)
Abs Immature Granulocytes: 0.01 10*3/uL (ref 0.00–0.07)
Basophils Absolute: 0 10*3/uL (ref 0.0–0.1)
Basophils Relative: 1 %
Eosinophils Absolute: 0.1 10*3/uL (ref 0.0–0.5)
Eosinophils Relative: 3 %
HCT: 30.9 % — ABNORMAL LOW (ref 39.0–52.0)
Hemoglobin: 10 g/dL — ABNORMAL LOW (ref 13.0–17.0)
Immature Granulocytes: 0 %
Lymphocytes Relative: 22 %
Lymphs Abs: 0.8 10*3/uL (ref 0.7–4.0)
MCH: 32.4 pg (ref 26.0–34.0)
MCHC: 32.4 g/dL (ref 30.0–36.0)
MCV: 100 fL (ref 80.0–100.0)
Monocytes Absolute: 0.4 10*3/uL (ref 0.1–1.0)
Monocytes Relative: 10 %
Neutro Abs: 2.4 10*3/uL (ref 1.7–7.7)
Neutrophils Relative %: 64 %
Platelet Count: 97 10*3/uL — ABNORMAL LOW (ref 150–400)
RBC: 3.09 MIL/uL — ABNORMAL LOW (ref 4.22–5.81)
RDW: 12 % (ref 11.5–15.5)
WBC Count: 3.7 10*3/uL — ABNORMAL LOW (ref 4.0–10.5)
nRBC: 0 % (ref 0.0–0.2)

## 2020-08-28 LAB — IRON AND TIBC
Iron: 67 ug/dL (ref 42–163)
Saturation Ratios: 21 % (ref 20–55)
TIBC: 316 ug/dL (ref 202–409)
UIBC: 249 ug/dL (ref 117–376)

## 2020-08-28 LAB — FERRITIN: Ferritin: 65 ng/mL (ref 24–336)

## 2020-08-28 NOTE — Progress Notes (Signed)
Hematology and Oncology Follow Up Visit  UCHENNA RAPPAPORT [id number] 1926/09/26 86 y.o. 08/28/2020 1:09 PM Leighton Ruff, MDBarnes, Benjamine Mola, MD   Principle Diagnosis: 86 year old man with anemia related to chronic renal insufficiency and iron deficiency diagnosed in June 2019.    Current therapy: Oral iron replacement daily.   Interim History: Mr. Winrow presents today for a follow-up visit.  Since the last visit, he reports no major changes in his health.  He denies any recent hospitalization or illnesses.  He denies any excessive fatigue or tiredness.  He denies any hematochezia, melena falls or syncope.              Medications: Reviewed without changes. Current Outpatient Medications  Medication Sig Dispense Refill  . cholecalciferol (VITAMIN D) 1000 UNITS tablet Take 2,000 Units by mouth daily.    . ferrous sulfate 325 (65 FE) MG EC tablet Take 325 mg by mouth daily with breakfast.    . fish oil-omega-3 fatty acids 1000 MG capsule Take 2 g by mouth daily.    [name] levETIRAcetam (KEPPRA) 500 MG tablet Take 1 tablet (500 mg total) by mouth 2 (two) times daily. Please request future refills from PCP. 180 tablet 0  . Multiple Vitamin (MULTIVITAMIN WITH MINERALS) TABS Take 1 tablet by mouth daily.    . sertraline (ZOLOFT) 100 MG tablet Take 100 mg by mouth daily.    [name] torsemide (DEMADEX) 20 MG tablet Take 1 tablet by mouth once daily 90 tablet 3  . vitamin B-12 (CYANOCOBALAMIN) 1000 MCG tablet Take 1,000 mcg by mouth every other day.     . warfarin (COUMADIN) 5 MG tablet TAKE BY MOUTH AS DIRECTED BY COUMADIN CLINIC 100 tablet 1   No current facility-administered medications for this visit.     Allergies: No Known Allergies      Physical Exam:   Blood pressure (!) 103/40, pulse (!) 57, temperature (!) 96.5 F (35.8 C), temperature source Tympanic, resp. rate 16, height 5\' 6"  (1.676 m), weight 100 lb 11.2 oz (45.7 kg), SpO2 100 %.    ECOG: 1   General  appearance: Comfortable appearing without any discomfort Head: Normocephalic without any trauma Oropharynx: Mucous membranes are moist and pink without any thrush or ulcers. Eyes: Pupils are equal and round reactive to light. Lymph nodes: No cervical, supraclavicular, inguinal or axillary lymphadenopathy.   Heart:regular rate and rhythm.  S1 and S2 without leg edema. Lung: Clear without any rhonchi or wheezes.  No dullness to percussion. Abdomin: Soft, nontender, nondistended with good bowel sounds.  No hepatosplenomegaly. Musculoskeletal: No joint deformity or effusion.  Full range of motion noted. Neurological: No deficits noted on motor, sensory and deep tendon reflex exam. Skin: No petechial rash or dryness.  Appeared moist.        Lab Results: Lab Results  Component Value Date   WBC 4.0 12/27/2019   HGB 9.6 (L) 12/27/2019   HCT 30.3 (L) 12/27/2019   MCV 99.3 12/27/2019   PLT 96 (L) 12/27/2019     Chemistry      Component Value Date/Time   NA 140 10/27/2018 1427   NA 136 05/21/2017 1609   K 4.2 10/27/2018 1427   CL 100 10/27/2018 1427   CO2 31 10/27/2018 1427   BUN 42 (H) 10/27/2018 1427   BUN 35 (H) 05/21/2017 1609   CREATININE 1.79 (H) 10/27/2018 1427   CREATININE 1.67 (H) 08/12/2016 1527      Component Value Date/Time   CALCIUM 9.1 10/27/2018 1427  ALKPHOS 83 10/27/2018 1427   AST 22 10/27/2018 1427   ALT 14 10/27/2018 1427   BILITOT 0.5 10/27/2018 1427          Impression and Plan:  86 year old man with:  1.  Anemia of chronic renal disease diagnosed in 2019.  He has also element of iron deficiency.  Laboratory data in the last 6 months reviewed and showed normalization of his iron stores and creatinine clearance that is currently around 30 cc/min.  Other possibilities regarding his anemia could be myelodysplastic syndrome.  Laboratory data reviewed and showed a hemoglobin of 10.0 which has been stable in the last year.  From a management  standpoint, I see no need for any growth factor support at this time with overall stable counts.  2.  Thrombocytopenia: Platelet count currently at 97 and overall mild and asymptomatic.  Risk of bleeding is negligible at this time and intervention is not required.  He is agreeable at this point the plan is reviewed with his wife was also in agreement.   3.  Follow-up: In 1 year for repeat follow-up.  30  minutes were spent on this encounter.  The time was dedicated to reviewing laboratory data, discussing disease disease status, differential diagnosis and management choices for the future.     Zola Button, MD 12/7/20211:09 PM

## 2020-09-02 ENCOUNTER — Other Ambulatory Visit: Payer: Self-pay | Admitting: Neurology

## 2020-09-04 DIAGNOSIS — F339 Major depressive disorder, recurrent, unspecified: Secondary | ICD-10-CM | POA: Diagnosis not present

## 2020-09-04 DIAGNOSIS — Z8639 Personal history of other endocrine, nutritional and metabolic disease: Secondary | ICD-10-CM | POA: Diagnosis not present

## 2020-09-04 DIAGNOSIS — E0590 Thyrotoxicosis, unspecified without thyrotoxic crisis or storm: Secondary | ICD-10-CM | POA: Diagnosis not present

## 2020-09-04 DIAGNOSIS — N183 Chronic kidney disease, stage 3 unspecified: Secondary | ICD-10-CM | POA: Diagnosis not present

## 2020-09-04 DIAGNOSIS — G40909 Epilepsy, unspecified, not intractable, without status epilepticus: Secondary | ICD-10-CM | POA: Diagnosis not present

## 2020-09-04 DIAGNOSIS — D631 Anemia in chronic kidney disease: Secondary | ICD-10-CM | POA: Diagnosis not present

## 2020-09-04 DIAGNOSIS — I4891 Unspecified atrial fibrillation: Secondary | ICD-10-CM | POA: Diagnosis not present

## 2020-09-04 DIAGNOSIS — E7800 Pure hypercholesterolemia, unspecified: Secondary | ICD-10-CM | POA: Diagnosis not present

## 2020-09-04 DIAGNOSIS — Z0000 Encounter for general adult medical examination without abnormal findings: Secondary | ICD-10-CM | POA: Diagnosis not present

## 2020-09-04 DIAGNOSIS — F0390 Unspecified dementia without behavioral disturbance: Secondary | ICD-10-CM | POA: Diagnosis not present

## 2020-09-04 DIAGNOSIS — I5032 Chronic diastolic (congestive) heart failure: Secondary | ICD-10-CM | POA: Diagnosis not present

## 2020-09-04 DIAGNOSIS — I2510 Atherosclerotic heart disease of native coronary artery without angina pectoris: Secondary | ICD-10-CM | POA: Diagnosis not present

## 2020-09-07 MED ORDER — TORSEMIDE 20 MG PO TABS: 10.0000 mg | ORAL_TABLET | Freq: Every day | ORAL | 0 refills | Status: DC

## 2020-09-07 MED ORDER — TORSEMIDE 20 MG PO TABS: 10.0000 mg | ORAL_TABLET | Freq: Every day | ORAL | 0 refills | Status: AC

## 2020-09-19 ENCOUNTER — Other Ambulatory Visit: Payer: Self-pay

## 2020-09-19 ENCOUNTER — Ambulatory Visit (INDEPENDENT_AMBULATORY_CARE_PROVIDER_SITE_OTHER): Payer: Medicare Other | Admitting: *Deleted

## 2020-09-19 DIAGNOSIS — Z7901 Long term (current) use of anticoagulants: Secondary | ICD-10-CM

## 2020-09-19 DIAGNOSIS — Z5181 Encounter for therapeutic drug level monitoring: Secondary | ICD-10-CM

## 2020-09-19 DIAGNOSIS — I4891 Unspecified atrial fibrillation: Secondary | ICD-10-CM

## 2020-09-19 DIAGNOSIS — I4821 Permanent atrial fibrillation: Secondary | ICD-10-CM | POA: Diagnosis not present

## 2020-09-19 LAB — POCT INR: INR: 3.2 — AB (ref 2.0–3.0)

## 2020-09-19 NOTE — Patient Instructions (Signed)
Description   Hold today's dose then start taking 1 tablet daily except 1/2 tablet Sundays. Recheck INR in 3 weeks. Call us with any concerns or medication changes # [phone] Coumadin Clinic, Main # [phone].

## 2020-09-23 DIAGNOSIS — Z7901 Long term (current) use of anticoagulants: Secondary | ICD-10-CM | POA: Diagnosis not present

## 2020-09-23 DIAGNOSIS — I2510 Atherosclerotic heart disease of native coronary artery without angina pectoris: Secondary | ICD-10-CM | POA: Diagnosis not present

## 2020-09-23 DIAGNOSIS — G40909 Epilepsy, unspecified, not intractable, without status epilepticus: Secondary | ICD-10-CM | POA: Diagnosis not present

## 2020-09-23 DIAGNOSIS — F339 Major depressive disorder, recurrent, unspecified: Secondary | ICD-10-CM | POA: Diagnosis not present

## 2020-09-23 DIAGNOSIS — E0590 Thyrotoxicosis, unspecified without thyrotoxic crisis or storm: Secondary | ICD-10-CM | POA: Diagnosis not present

## 2020-09-23 DIAGNOSIS — D631 Anemia in chronic kidney disease: Secondary | ICD-10-CM | POA: Diagnosis not present

## 2020-09-23 DIAGNOSIS — I5042 Chronic combined systolic (congestive) and diastolic (congestive) heart failure: Secondary | ICD-10-CM | POA: Diagnosis not present

## 2020-09-23 DIAGNOSIS — N183 Chronic kidney disease, stage 3 unspecified: Secondary | ICD-10-CM | POA: Diagnosis not present

## 2020-09-23 DIAGNOSIS — Z950 Presence of cardiac pacemaker: Secondary | ICD-10-CM | POA: Diagnosis not present

## 2020-09-23 DIAGNOSIS — I130 Hypertensive heart and chronic kidney disease with heart failure and stage 1 through stage 4 chronic kidney disease, or unspecified chronic kidney disease: Secondary | ICD-10-CM | POA: Diagnosis not present

## 2020-09-23 DIAGNOSIS — E559 Vitamin D deficiency, unspecified: Secondary | ICD-10-CM | POA: Diagnosis not present

## 2020-09-23 DIAGNOSIS — Z8546 Personal history of malignant neoplasm of prostate: Secondary | ICD-10-CM | POA: Diagnosis not present

## 2020-09-23 DIAGNOSIS — I252 Old myocardial infarction: Secondary | ICD-10-CM | POA: Diagnosis not present

## 2020-09-23 DIAGNOSIS — I4891 Unspecified atrial fibrillation: Secondary | ICD-10-CM | POA: Diagnosis not present

## 2020-09-23 DIAGNOSIS — Z79899 Other long term (current) drug therapy: Secondary | ICD-10-CM | POA: Diagnosis not present

## 2020-09-23 DIAGNOSIS — E7800 Pure hypercholesterolemia, unspecified: Secondary | ICD-10-CM | POA: Diagnosis not present

## 2020-09-23 DIAGNOSIS — I061 Rheumatic aortic insufficiency: Secondary | ICD-10-CM | POA: Diagnosis not present

## 2020-09-23 DIAGNOSIS — Z85828 Personal history of other malignant neoplasm of skin: Secondary | ICD-10-CM | POA: Diagnosis not present

## 2020-09-23 DIAGNOSIS — F0281 Dementia in other diseases classified elsewhere with behavioral disturbance: Secondary | ICD-10-CM | POA: Diagnosis not present

## 2020-09-23 DIAGNOSIS — Z9181 History of falling: Secondary | ICD-10-CM | POA: Diagnosis not present

## 2020-09-23 DIAGNOSIS — F419 Anxiety disorder, unspecified: Secondary | ICD-10-CM | POA: Diagnosis not present

## 2020-09-24 ENCOUNTER — Telehealth: Payer: Self-pay | Admitting: Pharmacist

## 2020-09-24 NOTE — Telephone Encounter (Signed)
Patient is now enrolled in home health services who will do home INR checks on patient.  Next check due 10/11/19

## 2020-09-25 DIAGNOSIS — D631 Anemia in chronic kidney disease: Secondary | ICD-10-CM | POA: Diagnosis not present

## 2020-09-25 DIAGNOSIS — I130 Hypertensive heart and chronic kidney disease with heart failure and stage 1 through stage 4 chronic kidney disease, or unspecified chronic kidney disease: Secondary | ICD-10-CM | POA: Diagnosis not present

## 2020-09-25 DIAGNOSIS — F0281 Dementia in other diseases classified elsewhere with behavioral disturbance: Secondary | ICD-10-CM | POA: Diagnosis not present

## 2020-09-25 DIAGNOSIS — G40909 Epilepsy, unspecified, not intractable, without status epilepticus: Secondary | ICD-10-CM | POA: Diagnosis not present

## 2020-09-25 DIAGNOSIS — N183 Chronic kidney disease, stage 3 unspecified: Secondary | ICD-10-CM | POA: Diagnosis not present

## 2020-09-25 DIAGNOSIS — I5042 Chronic combined systolic (congestive) and diastolic (congestive) heart failure: Secondary | ICD-10-CM | POA: Diagnosis not present

## 2020-09-26 ENCOUNTER — Ambulatory Visit (INDEPENDENT_AMBULATORY_CARE_PROVIDER_SITE_OTHER): Payer: Medicare Other

## 2020-09-26 DIAGNOSIS — I495 Sick sinus syndrome: Secondary | ICD-10-CM | POA: Diagnosis not present

## 2020-09-26 LAB — CUP PACEART REMOTE DEVICE CHECK
Battery Remaining Longevity: 48 mo
Battery Remaining Percentage: 53 %
Brady Statistic RA Percent Paced: 0 %
Brady Statistic RV Percent Paced: 82 %
Date Time Interrogation Session: 20220105005500
Implantable Lead Implant Date: 20130917
Implantable Lead Implant Date: 20130917
Implantable Lead Location: 753859
Implantable Lead Location: 753860
Implantable Lead Model: 4456
Implantable Lead Model: 4469
Implantable Lead Serial Number: 448722
Implantable Lead Serial Number: 484382
Implantable Pulse Generator Implant Date: 20130917
Lead Channel Impedance Value: 346 Ohm
Lead Channel Impedance Value: 370 Ohm
Lead Channel Setting Pacing Amplitude: 2.4 V
Lead Channel Setting Pacing Pulse Width: 0.4 ms
Lead Channel Setting Sensing Sensitivity: 2.5 mV
Pulse Gen Serial Number: 111635

## 2020-09-28 DIAGNOSIS — F0281 Dementia in other diseases classified elsewhere with behavioral disturbance: Secondary | ICD-10-CM | POA: Diagnosis not present

## 2020-09-28 DIAGNOSIS — I5042 Chronic combined systolic (congestive) and diastolic (congestive) heart failure: Secondary | ICD-10-CM | POA: Diagnosis not present

## 2020-09-28 DIAGNOSIS — D631 Anemia in chronic kidney disease: Secondary | ICD-10-CM | POA: Diagnosis not present

## 2020-09-28 DIAGNOSIS — G40909 Epilepsy, unspecified, not intractable, without status epilepticus: Secondary | ICD-10-CM | POA: Diagnosis not present

## 2020-09-28 DIAGNOSIS — N183 Chronic kidney disease, stage 3 unspecified: Secondary | ICD-10-CM | POA: Diagnosis not present

## 2020-09-28 DIAGNOSIS — I130 Hypertensive heart and chronic kidney disease with heart failure and stage 1 through stage 4 chronic kidney disease, or unspecified chronic kidney disease: Secondary | ICD-10-CM | POA: Diagnosis not present

## 2020-10-02 DIAGNOSIS — F0281 Dementia in other diseases classified elsewhere with behavioral disturbance: Secondary | ICD-10-CM | POA: Diagnosis not present

## 2020-10-02 DIAGNOSIS — N183 Chronic kidney disease, stage 3 unspecified: Secondary | ICD-10-CM | POA: Diagnosis not present

## 2020-10-02 DIAGNOSIS — I5042 Chronic combined systolic (congestive) and diastolic (congestive) heart failure: Secondary | ICD-10-CM | POA: Diagnosis not present

## 2020-10-02 DIAGNOSIS — G40909 Epilepsy, unspecified, not intractable, without status epilepticus: Secondary | ICD-10-CM | POA: Diagnosis not present

## 2020-10-02 DIAGNOSIS — D631 Anemia in chronic kidney disease: Secondary | ICD-10-CM | POA: Diagnosis not present

## 2020-10-02 DIAGNOSIS — I130 Hypertensive heart and chronic kidney disease with heart failure and stage 1 through stage 4 chronic kidney disease, or unspecified chronic kidney disease: Secondary | ICD-10-CM | POA: Diagnosis not present

## 2020-10-04 DIAGNOSIS — M792 Neuralgia and neuritis, unspecified: Secondary | ICD-10-CM | POA: Diagnosis not present

## 2020-10-04 DIAGNOSIS — M21962 Unspecified acquired deformity of left lower leg: Secondary | ICD-10-CM | POA: Diagnosis not present

## 2020-10-04 DIAGNOSIS — I739 Peripheral vascular disease, unspecified: Secondary | ICD-10-CM | POA: Diagnosis not present

## 2020-10-04 DIAGNOSIS — B351 Tinea unguium: Secondary | ICD-10-CM | POA: Diagnosis not present

## 2020-10-04 DIAGNOSIS — M79674 Pain in right toe(s): Secondary | ICD-10-CM | POA: Diagnosis not present

## 2020-10-05 DIAGNOSIS — G40909 Epilepsy, unspecified, not intractable, without status epilepticus: Secondary | ICD-10-CM | POA: Diagnosis not present

## 2020-10-05 DIAGNOSIS — I130 Hypertensive heart and chronic kidney disease with heart failure and stage 1 through stage 4 chronic kidney disease, or unspecified chronic kidney disease: Secondary | ICD-10-CM | POA: Diagnosis not present

## 2020-10-05 DIAGNOSIS — N183 Chronic kidney disease, stage 3 unspecified: Secondary | ICD-10-CM | POA: Diagnosis not present

## 2020-10-05 DIAGNOSIS — F0281 Dementia in other diseases classified elsewhere with behavioral disturbance: Secondary | ICD-10-CM | POA: Diagnosis not present

## 2020-10-05 DIAGNOSIS — I5042 Chronic combined systolic (congestive) and diastolic (congestive) heart failure: Secondary | ICD-10-CM | POA: Diagnosis not present

## 2020-10-05 DIAGNOSIS — D631 Anemia in chronic kidney disease: Secondary | ICD-10-CM | POA: Diagnosis not present

## 2020-10-06 DIAGNOSIS — I130 Hypertensive heart and chronic kidney disease with heart failure and stage 1 through stage 4 chronic kidney disease, or unspecified chronic kidney disease: Secondary | ICD-10-CM | POA: Diagnosis not present

## 2020-10-06 DIAGNOSIS — G40909 Epilepsy, unspecified, not intractable, without status epilepticus: Secondary | ICD-10-CM | POA: Diagnosis not present

## 2020-10-06 DIAGNOSIS — N183 Chronic kidney disease, stage 3 unspecified: Secondary | ICD-10-CM | POA: Diagnosis not present

## 2020-10-06 DIAGNOSIS — F0281 Dementia in other diseases classified elsewhere with behavioral disturbance: Secondary | ICD-10-CM | POA: Diagnosis not present

## 2020-10-06 DIAGNOSIS — I5042 Chronic combined systolic (congestive) and diastolic (congestive) heart failure: Secondary | ICD-10-CM | POA: Diagnosis not present

## 2020-10-06 DIAGNOSIS — D631 Anemia in chronic kidney disease: Secondary | ICD-10-CM | POA: Diagnosis not present

## 2020-10-10 ENCOUNTER — Ambulatory Visit (INDEPENDENT_AMBULATORY_CARE_PROVIDER_SITE_OTHER): Payer: Medicare Other | Admitting: Cardiology

## 2020-10-10 DIAGNOSIS — Z5181 Encounter for therapeutic drug level monitoring: Secondary | ICD-10-CM

## 2020-10-10 DIAGNOSIS — G40909 Epilepsy, unspecified, not intractable, without status epilepticus: Secondary | ICD-10-CM | POA: Diagnosis not present

## 2020-10-10 DIAGNOSIS — N183 Chronic kidney disease, stage 3 unspecified: Secondary | ICD-10-CM | POA: Diagnosis not present

## 2020-10-10 DIAGNOSIS — F0281 Dementia in other diseases classified elsewhere with behavioral disturbance: Secondary | ICD-10-CM | POA: Diagnosis not present

## 2020-10-10 DIAGNOSIS — B351 Tinea unguium: Secondary | ICD-10-CM | POA: Diagnosis not present

## 2020-10-10 DIAGNOSIS — I130 Hypertensive heart and chronic kidney disease with heart failure and stage 1 through stage 4 chronic kidney disease, or unspecified chronic kidney disease: Secondary | ICD-10-CM | POA: Diagnosis not present

## 2020-10-10 DIAGNOSIS — I5042 Chronic combined systolic (congestive) and diastolic (congestive) heart failure: Secondary | ICD-10-CM | POA: Diagnosis not present

## 2020-10-10 DIAGNOSIS — D631 Anemia in chronic kidney disease: Secondary | ICD-10-CM | POA: Diagnosis not present

## 2020-10-10 LAB — POCT INR: INR: 3.9 — AB (ref 2.0–3.0)

## 2020-10-10 NOTE — Patient Instructions (Addendum)
Description   Spoke to [place] while at pt's home, instructed for pt to hold today's dose of warfarin then start taking 1 tablet daily except 1/2 tablet Sundays and Wednesdays. Recheck INR in 2 weeks. Call us with any concerns or medication changes # [phone] Coumadin Clinic, Main # [phone].

## 2020-10-11 DIAGNOSIS — I130 Hypertensive heart and chronic kidney disease with heart failure and stage 1 through stage 4 chronic kidney disease, or unspecified chronic kidney disease: Secondary | ICD-10-CM | POA: Diagnosis not present

## 2020-10-11 DIAGNOSIS — F0281 Dementia in other diseases classified elsewhere with behavioral disturbance: Secondary | ICD-10-CM | POA: Diagnosis not present

## 2020-10-11 DIAGNOSIS — I5042 Chronic combined systolic (congestive) and diastolic (congestive) heart failure: Secondary | ICD-10-CM | POA: Diagnosis not present

## 2020-10-11 DIAGNOSIS — N183 Chronic kidney disease, stage 3 unspecified: Secondary | ICD-10-CM | POA: Diagnosis not present

## 2020-10-11 DIAGNOSIS — D631 Anemia in chronic kidney disease: Secondary | ICD-10-CM | POA: Diagnosis not present

## 2020-10-11 DIAGNOSIS — G40909 Epilepsy, unspecified, not intractable, without status epilepticus: Secondary | ICD-10-CM | POA: Diagnosis not present

## 2020-10-11 NOTE — Progress Notes (Signed)
Remote pacemaker transmission.   

## 2020-10-16 DIAGNOSIS — I5042 Chronic combined systolic (congestive) and diastolic (congestive) heart failure: Secondary | ICD-10-CM | POA: Diagnosis not present

## 2020-10-16 DIAGNOSIS — G40909 Epilepsy, unspecified, not intractable, without status epilepticus: Secondary | ICD-10-CM | POA: Diagnosis not present

## 2020-10-16 DIAGNOSIS — F339 Major depressive disorder, recurrent, unspecified: Secondary | ICD-10-CM | POA: Diagnosis not present

## 2020-10-16 DIAGNOSIS — I5032 Chronic diastolic (congestive) heart failure: Secondary | ICD-10-CM | POA: Diagnosis not present

## 2020-10-16 DIAGNOSIS — I130 Hypertensive heart and chronic kidney disease with heart failure and stage 1 through stage 4 chronic kidney disease, or unspecified chronic kidney disease: Secondary | ICD-10-CM | POA: Diagnosis not present

## 2020-10-16 DIAGNOSIS — I4891 Unspecified atrial fibrillation: Secondary | ICD-10-CM | POA: Diagnosis not present

## 2020-10-16 DIAGNOSIS — E7800 Pure hypercholesterolemia, unspecified: Secondary | ICD-10-CM | POA: Diagnosis not present

## 2020-10-16 DIAGNOSIS — D631 Anemia in chronic kidney disease: Secondary | ICD-10-CM | POA: Diagnosis not present

## 2020-10-16 DIAGNOSIS — F0281 Dementia in other diseases classified elsewhere with behavioral disturbance: Secondary | ICD-10-CM | POA: Diagnosis not present

## 2020-10-16 DIAGNOSIS — I2510 Atherosclerotic heart disease of native coronary artery without angina pectoris: Secondary | ICD-10-CM | POA: Diagnosis not present

## 2020-10-16 DIAGNOSIS — N183 Chronic kidney disease, stage 3 unspecified: Secondary | ICD-10-CM | POA: Diagnosis not present

## 2020-10-16 DIAGNOSIS — F0390 Unspecified dementia without behavioral disturbance: Secondary | ICD-10-CM | POA: Diagnosis not present

## 2020-10-16 DIAGNOSIS — Z8546 Personal history of malignant neoplasm of prostate: Secondary | ICD-10-CM | POA: Diagnosis not present

## 2020-10-17 DIAGNOSIS — N183 Chronic kidney disease, stage 3 unspecified: Secondary | ICD-10-CM | POA: Diagnosis not present

## 2020-10-17 DIAGNOSIS — D631 Anemia in chronic kidney disease: Secondary | ICD-10-CM | POA: Diagnosis not present

## 2020-10-17 DIAGNOSIS — I5042 Chronic combined systolic (congestive) and diastolic (congestive) heart failure: Secondary | ICD-10-CM | POA: Diagnosis not present

## 2020-10-17 DIAGNOSIS — G40909 Epilepsy, unspecified, not intractable, without status epilepticus: Secondary | ICD-10-CM | POA: Diagnosis not present

## 2020-10-17 DIAGNOSIS — F0281 Dementia in other diseases classified elsewhere with behavioral disturbance: Secondary | ICD-10-CM | POA: Diagnosis not present

## 2020-10-17 DIAGNOSIS — I130 Hypertensive heart and chronic kidney disease with heart failure and stage 1 through stage 4 chronic kidney disease, or unspecified chronic kidney disease: Secondary | ICD-10-CM | POA: Diagnosis not present

## 2020-10-18 DIAGNOSIS — G40909 Epilepsy, unspecified, not intractable, without status epilepticus: Secondary | ICD-10-CM | POA: Diagnosis not present

## 2020-10-18 DIAGNOSIS — D631 Anemia in chronic kidney disease: Secondary | ICD-10-CM | POA: Diagnosis not present

## 2020-10-18 DIAGNOSIS — I5042 Chronic combined systolic (congestive) and diastolic (congestive) heart failure: Secondary | ICD-10-CM | POA: Diagnosis not present

## 2020-10-18 DIAGNOSIS — F0281 Dementia in other diseases classified elsewhere with behavioral disturbance: Secondary | ICD-10-CM | POA: Diagnosis not present

## 2020-10-18 DIAGNOSIS — I130 Hypertensive heart and chronic kidney disease with heart failure and stage 1 through stage 4 chronic kidney disease, or unspecified chronic kidney disease: Secondary | ICD-10-CM | POA: Diagnosis not present

## 2020-10-18 DIAGNOSIS — N183 Chronic kidney disease, stage 3 unspecified: Secondary | ICD-10-CM | POA: Diagnosis not present

## 2020-10-19 DIAGNOSIS — I5042 Chronic combined systolic (congestive) and diastolic (congestive) heart failure: Secondary | ICD-10-CM | POA: Diagnosis not present

## 2020-10-19 DIAGNOSIS — D631 Anemia in chronic kidney disease: Secondary | ICD-10-CM | POA: Diagnosis not present

## 2020-10-19 DIAGNOSIS — F0281 Dementia in other diseases classified elsewhere with behavioral disturbance: Secondary | ICD-10-CM | POA: Diagnosis not present

## 2020-10-19 DIAGNOSIS — N183 Chronic kidney disease, stage 3 unspecified: Secondary | ICD-10-CM | POA: Diagnosis not present

## 2020-10-19 DIAGNOSIS — G40909 Epilepsy, unspecified, not intractable, without status epilepticus: Secondary | ICD-10-CM | POA: Diagnosis not present

## 2020-10-19 DIAGNOSIS — I130 Hypertensive heart and chronic kidney disease with heart failure and stage 1 through stage 4 chronic kidney disease, or unspecified chronic kidney disease: Secondary | ICD-10-CM | POA: Diagnosis not present

## 2020-10-22 DIAGNOSIS — N183 Chronic kidney disease, stage 3 unspecified: Secondary | ICD-10-CM | POA: Diagnosis not present

## 2020-10-22 DIAGNOSIS — D631 Anemia in chronic kidney disease: Secondary | ICD-10-CM | POA: Diagnosis not present

## 2020-10-22 DIAGNOSIS — I130 Hypertensive heart and chronic kidney disease with heart failure and stage 1 through stage 4 chronic kidney disease, or unspecified chronic kidney disease: Secondary | ICD-10-CM | POA: Diagnosis not present

## 2020-10-22 DIAGNOSIS — F0281 Dementia in other diseases classified elsewhere with behavioral disturbance: Secondary | ICD-10-CM | POA: Diagnosis not present

## 2020-10-22 DIAGNOSIS — I5042 Chronic combined systolic (congestive) and diastolic (congestive) heart failure: Secondary | ICD-10-CM | POA: Diagnosis not present

## 2020-10-22 DIAGNOSIS — G40909 Epilepsy, unspecified, not intractable, without status epilepticus: Secondary | ICD-10-CM | POA: Diagnosis not present

## 2020-10-23 DIAGNOSIS — F0281 Dementia in other diseases classified elsewhere with behavioral disturbance: Secondary | ICD-10-CM | POA: Diagnosis not present

## 2020-10-23 DIAGNOSIS — Z950 Presence of cardiac pacemaker: Secondary | ICD-10-CM | POA: Diagnosis not present

## 2020-10-23 DIAGNOSIS — I4891 Unspecified atrial fibrillation: Secondary | ICD-10-CM | POA: Diagnosis not present

## 2020-10-23 DIAGNOSIS — I2510 Atherosclerotic heart disease of native coronary artery without angina pectoris: Secondary | ICD-10-CM | POA: Diagnosis not present

## 2020-10-23 DIAGNOSIS — E559 Vitamin D deficiency, unspecified: Secondary | ICD-10-CM | POA: Diagnosis not present

## 2020-10-23 DIAGNOSIS — N183 Chronic kidney disease, stage 3 unspecified: Secondary | ICD-10-CM | POA: Diagnosis not present

## 2020-10-23 DIAGNOSIS — I5042 Chronic combined systolic (congestive) and diastolic (congestive) heart failure: Secondary | ICD-10-CM | POA: Diagnosis not present

## 2020-10-23 DIAGNOSIS — D631 Anemia in chronic kidney disease: Secondary | ICD-10-CM | POA: Diagnosis not present

## 2020-10-23 DIAGNOSIS — I130 Hypertensive heart and chronic kidney disease with heart failure and stage 1 through stage 4 chronic kidney disease, or unspecified chronic kidney disease: Secondary | ICD-10-CM | POA: Diagnosis not present

## 2020-10-23 DIAGNOSIS — I252 Old myocardial infarction: Secondary | ICD-10-CM | POA: Diagnosis not present

## 2020-10-23 DIAGNOSIS — Z85828 Personal history of other malignant neoplasm of skin: Secondary | ICD-10-CM | POA: Diagnosis not present

## 2020-10-23 DIAGNOSIS — Z7901 Long term (current) use of anticoagulants: Secondary | ICD-10-CM | POA: Diagnosis not present

## 2020-10-23 DIAGNOSIS — G40909 Epilepsy, unspecified, not intractable, without status epilepticus: Secondary | ICD-10-CM | POA: Diagnosis not present

## 2020-10-23 DIAGNOSIS — E0590 Thyrotoxicosis, unspecified without thyrotoxic crisis or storm: Secondary | ICD-10-CM | POA: Diagnosis not present

## 2020-10-23 DIAGNOSIS — Z9181 History of falling: Secondary | ICD-10-CM | POA: Diagnosis not present

## 2020-10-23 DIAGNOSIS — F419 Anxiety disorder, unspecified: Secondary | ICD-10-CM | POA: Diagnosis not present

## 2020-10-23 DIAGNOSIS — E7800 Pure hypercholesterolemia, unspecified: Secondary | ICD-10-CM | POA: Diagnosis not present

## 2020-10-23 DIAGNOSIS — Z79899 Other long term (current) drug therapy: Secondary | ICD-10-CM | POA: Diagnosis not present

## 2020-10-23 DIAGNOSIS — I061 Rheumatic aortic insufficiency: Secondary | ICD-10-CM | POA: Diagnosis not present

## 2020-10-23 DIAGNOSIS — Z8546 Personal history of malignant neoplasm of prostate: Secondary | ICD-10-CM | POA: Diagnosis not present

## 2020-10-23 DIAGNOSIS — F339 Major depressive disorder, recurrent, unspecified: Secondary | ICD-10-CM | POA: Diagnosis not present

## 2020-10-24 ENCOUNTER — Ambulatory Visit (INDEPENDENT_AMBULATORY_CARE_PROVIDER_SITE_OTHER): Payer: Medicare Other | Admitting: *Deleted

## 2020-10-24 DIAGNOSIS — F0281 Dementia in other diseases classified elsewhere with behavioral disturbance: Secondary | ICD-10-CM | POA: Diagnosis not present

## 2020-10-24 DIAGNOSIS — Z5181 Encounter for therapeutic drug level monitoring: Secondary | ICD-10-CM

## 2020-10-24 DIAGNOSIS — I5042 Chronic combined systolic (congestive) and diastolic (congestive) heart failure: Secondary | ICD-10-CM | POA: Diagnosis not present

## 2020-10-24 DIAGNOSIS — I4821 Permanent atrial fibrillation: Secondary | ICD-10-CM | POA: Diagnosis not present

## 2020-10-24 DIAGNOSIS — G40909 Epilepsy, unspecified, not intractable, without status epilepticus: Secondary | ICD-10-CM | POA: Diagnosis not present

## 2020-10-24 DIAGNOSIS — I130 Hypertensive heart and chronic kidney disease with heart failure and stage 1 through stage 4 chronic kidney disease, or unspecified chronic kidney disease: Secondary | ICD-10-CM | POA: Diagnosis not present

## 2020-10-24 DIAGNOSIS — D631 Anemia in chronic kidney disease: Secondary | ICD-10-CM | POA: Diagnosis not present

## 2020-10-24 DIAGNOSIS — N183 Chronic kidney disease, stage 3 unspecified: Secondary | ICD-10-CM | POA: Diagnosis not present

## 2020-10-24 LAB — POCT INR: INR: 3.2 — AB (ref 2.0–3.0)

## 2020-10-24 NOTE — Patient Instructions (Signed)
Description   Spoke to [facility] with [facility] while at pt's home, instructed for pt to hold today's dose of warfarin then start taking 1 tablet daily except 1/2 tablet Sundays, Wednesdays, and Fridays. Recheck INR in 2 weeks. Call us with any concerns or medication changes # [phone] Coumadin Clinic, Main # [phone].

## 2020-10-30 DIAGNOSIS — D631 Anemia in chronic kidney disease: Secondary | ICD-10-CM | POA: Diagnosis not present

## 2020-10-30 DIAGNOSIS — G40909 Epilepsy, unspecified, not intractable, without status epilepticus: Secondary | ICD-10-CM | POA: Diagnosis not present

## 2020-10-30 DIAGNOSIS — N183 Chronic kidney disease, stage 3 unspecified: Secondary | ICD-10-CM | POA: Diagnosis not present

## 2020-10-30 DIAGNOSIS — I5042 Chronic combined systolic (congestive) and diastolic (congestive) heart failure: Secondary | ICD-10-CM | POA: Diagnosis not present

## 2020-10-30 DIAGNOSIS — F0281 Dementia in other diseases classified elsewhere with behavioral disturbance: Secondary | ICD-10-CM | POA: Diagnosis not present

## 2020-10-30 DIAGNOSIS — I130 Hypertensive heart and chronic kidney disease with heart failure and stage 1 through stage 4 chronic kidney disease, or unspecified chronic kidney disease: Secondary | ICD-10-CM | POA: Diagnosis not present

## 2020-10-31 DIAGNOSIS — D631 Anemia in chronic kidney disease: Secondary | ICD-10-CM | POA: Diagnosis not present

## 2020-10-31 DIAGNOSIS — N183 Chronic kidney disease, stage 3 unspecified: Secondary | ICD-10-CM | POA: Diagnosis not present

## 2020-10-31 DIAGNOSIS — F0281 Dementia in other diseases classified elsewhere with behavioral disturbance: Secondary | ICD-10-CM | POA: Diagnosis not present

## 2020-10-31 DIAGNOSIS — I5042 Chronic combined systolic (congestive) and diastolic (congestive) heart failure: Secondary | ICD-10-CM | POA: Diagnosis not present

## 2020-10-31 DIAGNOSIS — I130 Hypertensive heart and chronic kidney disease with heart failure and stage 1 through stage 4 chronic kidney disease, or unspecified chronic kidney disease: Secondary | ICD-10-CM | POA: Diagnosis not present

## 2020-10-31 DIAGNOSIS — G40909 Epilepsy, unspecified, not intractable, without status epilepticus: Secondary | ICD-10-CM | POA: Diagnosis not present

## 2020-11-02 DIAGNOSIS — I130 Hypertensive heart and chronic kidney disease with heart failure and stage 1 through stage 4 chronic kidney disease, or unspecified chronic kidney disease: Secondary | ICD-10-CM | POA: Diagnosis not present

## 2020-11-02 DIAGNOSIS — I5042 Chronic combined systolic (congestive) and diastolic (congestive) heart failure: Secondary | ICD-10-CM | POA: Diagnosis not present

## 2020-11-02 DIAGNOSIS — F0281 Dementia in other diseases classified elsewhere with behavioral disturbance: Secondary | ICD-10-CM | POA: Diagnosis not present

## 2020-11-02 DIAGNOSIS — G40909 Epilepsy, unspecified, not intractable, without status epilepticus: Secondary | ICD-10-CM | POA: Diagnosis not present

## 2020-11-02 DIAGNOSIS — N183 Chronic kidney disease, stage 3 unspecified: Secondary | ICD-10-CM | POA: Diagnosis not present

## 2020-11-02 DIAGNOSIS — D631 Anemia in chronic kidney disease: Secondary | ICD-10-CM | POA: Diagnosis not present

## 2020-11-05 DIAGNOSIS — F0281 Dementia in other diseases classified elsewhere with behavioral disturbance: Secondary | ICD-10-CM | POA: Diagnosis not present

## 2020-11-05 DIAGNOSIS — N183 Chronic kidney disease, stage 3 unspecified: Secondary | ICD-10-CM | POA: Diagnosis not present

## 2020-11-05 DIAGNOSIS — D631 Anemia in chronic kidney disease: Secondary | ICD-10-CM | POA: Diagnosis not present

## 2020-11-05 DIAGNOSIS — G40909 Epilepsy, unspecified, not intractable, without status epilepticus: Secondary | ICD-10-CM | POA: Diagnosis not present

## 2020-11-05 DIAGNOSIS — I130 Hypertensive heart and chronic kidney disease with heart failure and stage 1 through stage 4 chronic kidney disease, or unspecified chronic kidney disease: Secondary | ICD-10-CM | POA: Diagnosis not present

## 2020-11-05 DIAGNOSIS — I5042 Chronic combined systolic (congestive) and diastolic (congestive) heart failure: Secondary | ICD-10-CM | POA: Diagnosis not present

## 2020-11-06 DIAGNOSIS — N183 Chronic kidney disease, stage 3 unspecified: Secondary | ICD-10-CM | POA: Diagnosis not present

## 2020-11-06 DIAGNOSIS — G40909 Epilepsy, unspecified, not intractable, without status epilepticus: Secondary | ICD-10-CM | POA: Diagnosis not present

## 2020-11-06 DIAGNOSIS — F0281 Dementia in other diseases classified elsewhere with behavioral disturbance: Secondary | ICD-10-CM | POA: Diagnosis not present

## 2020-11-06 DIAGNOSIS — I130 Hypertensive heart and chronic kidney disease with heart failure and stage 1 through stage 4 chronic kidney disease, or unspecified chronic kidney disease: Secondary | ICD-10-CM | POA: Diagnosis not present

## 2020-11-06 DIAGNOSIS — I5042 Chronic combined systolic (congestive) and diastolic (congestive) heart failure: Secondary | ICD-10-CM | POA: Diagnosis not present

## 2020-11-06 DIAGNOSIS — D631 Anemia in chronic kidney disease: Secondary | ICD-10-CM | POA: Diagnosis not present

## 2020-11-07 ENCOUNTER — Ambulatory Visit (INDEPENDENT_AMBULATORY_CARE_PROVIDER_SITE_OTHER): Payer: Medicare Other | Admitting: *Deleted

## 2020-11-07 DIAGNOSIS — I4821 Permanent atrial fibrillation: Secondary | ICD-10-CM | POA: Diagnosis not present

## 2020-11-07 DIAGNOSIS — F0281 Dementia in other diseases classified elsewhere with behavioral disturbance: Secondary | ICD-10-CM | POA: Diagnosis not present

## 2020-11-07 DIAGNOSIS — Z5181 Encounter for therapeutic drug level monitoring: Secondary | ICD-10-CM | POA: Diagnosis not present

## 2020-11-07 DIAGNOSIS — D631 Anemia in chronic kidney disease: Secondary | ICD-10-CM | POA: Diagnosis not present

## 2020-11-07 DIAGNOSIS — I5042 Chronic combined systolic (congestive) and diastolic (congestive) heart failure: Secondary | ICD-10-CM | POA: Diagnosis not present

## 2020-11-07 DIAGNOSIS — I130 Hypertensive heart and chronic kidney disease with heart failure and stage 1 through stage 4 chronic kidney disease, or unspecified chronic kidney disease: Secondary | ICD-10-CM | POA: Diagnosis not present

## 2020-11-07 DIAGNOSIS — G40909 Epilepsy, unspecified, not intractable, without status epilepticus: Secondary | ICD-10-CM | POA: Diagnosis not present

## 2020-11-07 DIAGNOSIS — N183 Chronic kidney disease, stage 3 unspecified: Secondary | ICD-10-CM | POA: Diagnosis not present

## 2020-11-07 LAB — POCT INR: INR: 1.7 — AB (ref 2.0–3.0)

## 2020-11-07 NOTE — Patient Instructions (Signed)
Description   Spoke to [name] with [facility] while at pt's home, instructed for pt to 1 tablet of warfarin today then continue taking 1 tablet daily except 1/2 tablet Sundays, Wednesdays, and Fridays. Recheck INR in 2 weeks by Home Health. Call us with any concerns or medication changes # [phone] Coumadin Clinic, Main # [phone].

## 2020-11-08 ENCOUNTER — Encounter: Payer: Self-pay | Admitting: Neurology

## 2020-11-08 ENCOUNTER — Ambulatory Visit (INDEPENDENT_AMBULATORY_CARE_PROVIDER_SITE_OTHER): Payer: Medicare Other | Admitting: Neurology

## 2020-11-08 ENCOUNTER — Other Ambulatory Visit: Payer: Self-pay

## 2020-11-08 ENCOUNTER — Other Ambulatory Visit (HOSPITAL_COMMUNITY): Payer: Self-pay

## 2020-11-08 VITALS — BP 122/62 | HR 68 | Ht 66.0 in | Wt 98.0 lb

## 2020-11-08 DIAGNOSIS — R569 Unspecified convulsions: Secondary | ICD-10-CM | POA: Diagnosis not present

## 2020-11-08 DIAGNOSIS — F0390 Unspecified dementia without behavioral disturbance: Secondary | ICD-10-CM | POA: Diagnosis not present

## 2020-11-08 DIAGNOSIS — R1310 Dysphagia, unspecified: Secondary | ICD-10-CM

## 2020-11-08 DIAGNOSIS — R269 Unspecified abnormalities of gait and mobility: Secondary | ICD-10-CM | POA: Diagnosis not present

## 2020-11-08 MED ORDER — CARBIDOPA-LEVODOPA 25-100 MG PO TABS: 1.0000 | ORAL_TABLET | Freq: Three times a day (TID) | ORAL | 6 refills | Status: AC

## 2020-11-08 NOTE — Progress Notes (Signed)
Chief Complaint  Patient presents with  . Follow-up    Pt with wife and daughter, new room. Pt hard of hearing Previously followed here for seizure management and Dr Krista Blue had advised them before that he presented parkinson symptoms. Dec 2021 was having frequent falls. Completed therapy which helped.wanting to evaluate Parkinsons concerns    HISTORICAL  Alice Rieger Hogan,  87 year old male, seen in refer by his nephrologist Dr. Madelon Lips and his primary care physician Dr. Leighton Ruff for evaluation of seizure, initial evaluation was on September 09, 2017.  I reviewed and summarized the referring note, he has history of chronic renal disease, atrial fibrillation, coronary artery disease, hypertension, prostate cancer, anemia related to chronic renal failure, seizure disorder,  He reported history of seizures since 1999, was treated with phenobarbital 97.2 mg since then as well, he has 2 different kind of seizure, generalized tonic-clonic seizure without warning signs, or staring spells, last seizure was in 1999,  Chronic kidney disease has been under the care of his primary care for many years, was recently referred to nephrologist Dr. Madelon Lips, who is wondering if his long-term phenobarbital use contributed to his kidney disease,  I looked up Micromedex, there was no evidence to suggest that low-dose long-term phenobarbital use is related to kidney disease, but he has history of atrial fibrillation, taking Coumadin, phenobarbital is a enzyme inducer, is not a good antiepileptic medication in this scenario.  He is a retired Furniture conservator/restorer, since 2015, he was noted to have gradual onset memory loss, gait abnormality, occasional fall,  also had urinary urgency, occasional incontinence, which he attributed to his prostate problem.  Laboratory evaluations, INR 2.5, CBC showed hemoglobin of 9.5, creatinine 1.4 1,  I personally reviewed a CT head in October 2017, chronic right  cerebellar infarction, small vessel disease, mild cervical degenerative changes,  UPDATE Nov 12 2017: He was tapered off phenobarbital since end 2018, now taking Keppra 500mg  bid, no significant side effect. He continues to have mild memory loss, gait abnormality, was noted to have distal weakness, urinary frequency, previously ordered MRI of lumbar spine to evaluate for possible lumbar radiculopathy, but wife declined,  UPDATE Nov 08 2020: He is accompanied by his wife and daughter at today's clinical visit, reported slow worsening over the past couple years, now rely on his family for daily activity, bathing, toileting, dressing, sometimes even feeding, he rely on his walker, can take few steps, has increased gait abnormality, difficulty initiate gait, difficulty turning, also has worsening memory trouble, agitation sometimes,  Family is talking about long-term plan, is 77 years old wife is the main caregiver, at home  He has no recurrent seizure, taking Keppra 500 mg twice a day,   We again personally reviewed CT head and cervical spine in 2017, significant generalized atrophy, chronic right cerebellar infarction, supratentorium small vessel disease   REVIEW OF SYSTEMS: Full 14 system review of systems performed and notable only for as above All other review of systems were negative.  ALLERGIES: No Known Allergies  HOME MEDICATIONS: Current Outpatient Medications  Medication Sig Dispense Refill  . carbidopa-levodopa (SINEMET IR) 25-100 MG tablet Take 1 tablet by mouth 3 (three) times daily. 90 tablet 6  . cholecalciferol (VITAMIN D) 1000 UNITS tablet Take 2,000 Units by mouth daily.    . ferrous sulfate 325 (65 FE) MG EC tablet Take 325 mg by mouth daily with breakfast.    . fish oil-omega-3 fatty acids 1000 MG capsule Take 2 g by  mouth daily.    [name] levETIRAcetam (KEPPRA) 500 MG tablet Take 1 tablet (500 mg total) by mouth 2 (two) times daily. Please request future refills from PCP. 180  tablet 0  . Multiple Vitamin (MULTIVITAMIN WITH MINERALS) TABS Take 1 tablet by mouth daily.    . sertraline (ZOLOFT) 100 MG tablet Take 100 mg by mouth daily.    [name] torsemide (DEMADEX) 20 MG tablet Take 0.5 tablets (10 mg total) by mouth daily. 45 tablet 0  . vitamin B-12 (CYANOCOBALAMIN) 1000 MCG tablet Take 1,000 mcg by mouth every other day.     . warfarin (COUMADIN) 5 MG tablet TAKE BY MOUTH AS DIRECTED BY COUMADIN CLINIC 100 tablet 1   No current facility-administered medications for this visit.    PAST MEDICAL HISTORY: Past Medical History:  Diagnosis Date  . Anticoagulated on Coumadin   . Anxiety   . Aortic insufficiency    a. mod-severe by echo 2014. b. mod by echo 2017.  [name] Chronic anemia   . Chronic combined systolic and diastolic CHF (congestive heart failure) ([place])   . CKD (chronic kidney disease), stage III ([place])   . Coronary artery disease    a. s/p MI 1995.  [name] Hard of hearing   . Hypertension   . Hyponatremia   . Mitral regurgitation    a. prev severe, more recently mild in 2017 -> not felt to be candidate for any kind of surgical repair.  [name] Permanent atrial fibrillation ([place])   . Prostate cancer ([place]) 2002  . Seizure disorder ([place])   . Tachycardia-bradycardia syndrome ([place])    a. w/ sycope - initial implant 2007, last gen change 2013 with BSX dual chamber PPM.    PAST SURGICAL HISTORY: Past Surgical History:  Procedure Laterality Date  . artificial urinary sphincter    . CYSTOSCOPY N/A 03/07/2018   Procedure: CYSTOSCOPY FLEXIBLE;  Surgeon: Raynelle Bring, MD;  Location: [facility]S;  Service: Urology;  Laterality: N/A;  . HERNIA REPAIR  2002  . PACEMAKER INSERTION  04/29/06   Generator change (BSc) by Dr Rayann Heman 06/21/12  . PERMANENT PACEMAKER GENERATOR CHANGE N/A 06/08/2012   Procedure: PERMANENT PACEMAKER GENERATOR CHANGE;  Surgeon: Thompson Grayer, MD;  Location: [facility] CATH LAB;  Service: Cardiovascular;  Laterality: N/A;  . PROSTATE SURGERY    . PROSTATECTOMY  2002  .  URINARY SPHINCTER IMPLANT N/A 03/07/2018   Procedure: ARTIFICIAL URINARY SPHINCTER REMOVAL;  Surgeon: Raynelle Bring, MD;  Location: [facility]S;  Service: Urology;  Laterality: N/A;    FAMILY HISTORY: Family History  Problem Relation Age of Onset  . Colon polyps Brother   . Heart disease Mother   . Heart attack Mother   . Heart attack Father   . Heart disease Father   . Diabetes Father        Also, three of his brothers had diabetes.  [name] Heart attack Brother     SOCIAL HISTORY: Social History   Socioeconomic History  . Marital status: Married    Spouse name: Not on file  . Number of children: 2  . Years of education: 8th grade  . Highest education level: Not on file  Occupational History  . Occupation: Retired  Tobacco Use  . Smoking status: Never Smoker  . Smokeless tobacco: Never Used  Vaping Use  . Vaping Use: Never used  Substance and Sexual Activity  . Alcohol use: No  . Drug use: No  . Sexual activity: Not on file  Other Topics Concern  .  Not on file  Social History Narrative   Lives in [place] with his wife.   Right-handed.   Retired Furniture conservator/restorer.   No caffeine use.   Social Determinants of Health   Financial Resource Strain: Not on file  Food Insecurity: Not on file  Transportation Needs: Not on file  Physical Activity: Not on file  Stress: Not on file  Social Connections: Not on file  Intimate Partner Violence: Not on file     PHYSICAL EXAM   Vitals:   11/08/20 1444  BP: 122/62  Pulse: 68  Weight: 98 lb (44.5 kg)  Height: 5\' 6"  (1.676 m)   Not recorded     Body mass index is 15.82 kg/m.  PHYSICAL EXAMNIATION:  Gen: NAD, conversant, well nourised, well groomed                     Cardiovascular: Regular rate rhythm, no peripheral edema, warm, nontender. Eyes: Conjunctivae clear without exudates or hemorrhage Neck: Supple, no carotid bruits. Pulmonary: Clear to auscultation bilaterally   NEUROLOGICAL EXAM:  MENTAL  STATUS: Speech/cognition: Hard of hearing, difficult to perform Mini-Mental Status Examination, he is oriented to year, place, but has difficulty spelling world   CRANIAL NERVES: CN II: Visual fields are full to confrontation. Pupils are round equal and briskly reactive to light. CN III, IV, VI: extraocular movement are normal. No ptosis. CN V: Facial sensation is intact to light touch CN VII: Face is symmetric with normal eye closure  CN VIII: Hard of hearing CN IX, X: Phonation is normal. CN XI: Head turning and shoulder shrug are intact  MOTOR: He might have bilateral ankle dorsiflexion weakness, but it was difficult for him to cooperative on examination, mild fairly symmetric bilateral upper extremity rigidity, no significant upper extremity and proximal lower extremity weakness, does has fairly symmetric bradykinesia,  REFLEXES: Withdrawal to pain  SENSORY: Intact to light touch, pinprick and vibratory sensation are intact in fingers and toes.  COORDINATION: There is no trunk or limb dysmetria noted.  GAIT/STANCE: He needs help to get up from seated position, rely on his walker, difficulty initiate gait, small shuffling, difficulty turning  DIAGNOSTIC DATA (LABS, IMAGING, TESTING) - I reviewed patient records, labs, notes, testing and imaging myself where available.   ASSESSMENT AND PLAN  BRALON ANTKOWIAK is a 87 y.o. male   Dementia Epilepsy Gait abnormality   He continue to progress, with worsening functional status, required help in daily activity, including dressing, bathing, toileting, sometimes even feeding, can only take few steps with the help of the walker, also need assistant to get up from seated position,  He does has mild parkinsonian features, will give him a trial of Sinemet 25/100 mg titrating up to 1 tablet 3 times a day,  Continue Keppra 500 mg twice daily,  We also spent time discussed long-term plan, with his age, progressive clinical course, likely  will continue to deteriorate, needing more help,  Total time spent reviewing the chart, obtaining history, examined patient, ordering tests, documentation, consultations and family, care coordination was 40 minutes    Marcial Pacas, M.D. Ph.D.  [facility] [address], [place], [place] [address] Ph: [phone] Fax: [phone]  CC:  Marda Stalker, [place],  [place] [address]  Marda Stalker, PA-C

## 2020-11-12 DIAGNOSIS — F0281 Dementia in other diseases classified elsewhere with behavioral disturbance: Secondary | ICD-10-CM | POA: Diagnosis not present

## 2020-11-12 DIAGNOSIS — I5042 Chronic combined systolic (congestive) and diastolic (congestive) heart failure: Secondary | ICD-10-CM | POA: Diagnosis not present

## 2020-11-12 DIAGNOSIS — D631 Anemia in chronic kidney disease: Secondary | ICD-10-CM | POA: Diagnosis not present

## 2020-11-12 DIAGNOSIS — N183 Chronic kidney disease, stage 3 unspecified: Secondary | ICD-10-CM | POA: Diagnosis not present

## 2020-11-12 DIAGNOSIS — I130 Hypertensive heart and chronic kidney disease with heart failure and stage 1 through stage 4 chronic kidney disease, or unspecified chronic kidney disease: Secondary | ICD-10-CM | POA: Diagnosis not present

## 2020-11-12 DIAGNOSIS — G40909 Epilepsy, unspecified, not intractable, without status epilepticus: Secondary | ICD-10-CM | POA: Diagnosis not present

## 2020-11-13 ENCOUNTER — Encounter (HOSPITAL_COMMUNITY): Payer: Self-pay

## 2020-11-13 ENCOUNTER — Ambulatory Visit (HOSPITAL_COMMUNITY): Payer: Medicare Other

## 2020-11-13 ENCOUNTER — Encounter (HOSPITAL_COMMUNITY): Payer: Medicare Other

## 2020-11-15 DIAGNOSIS — N183 Chronic kidney disease, stage 3 unspecified: Secondary | ICD-10-CM | POA: Diagnosis not present

## 2020-11-15 DIAGNOSIS — I130 Hypertensive heart and chronic kidney disease with heart failure and stage 1 through stage 4 chronic kidney disease, or unspecified chronic kidney disease: Secondary | ICD-10-CM | POA: Diagnosis not present

## 2020-11-15 DIAGNOSIS — G40909 Epilepsy, unspecified, not intractable, without status epilepticus: Secondary | ICD-10-CM | POA: Diagnosis not present

## 2020-11-15 DIAGNOSIS — I5042 Chronic combined systolic (congestive) and diastolic (congestive) heart failure: Secondary | ICD-10-CM | POA: Diagnosis not present

## 2020-11-15 DIAGNOSIS — D631 Anemia in chronic kidney disease: Secondary | ICD-10-CM | POA: Diagnosis not present

## 2020-11-15 DIAGNOSIS — F0281 Dementia in other diseases classified elsewhere with behavioral disturbance: Secondary | ICD-10-CM | POA: Diagnosis not present

## 2020-11-20 DIAGNOSIS — F0390 Unspecified dementia without behavioral disturbance: Secondary | ICD-10-CM | POA: Diagnosis not present

## 2020-11-20 DIAGNOSIS — G40909 Epilepsy, unspecified, not intractable, without status epilepticus: Secondary | ICD-10-CM | POA: Diagnosis not present

## 2020-11-20 DIAGNOSIS — I130 Hypertensive heart and chronic kidney disease with heart failure and stage 1 through stage 4 chronic kidney disease, or unspecified chronic kidney disease: Secondary | ICD-10-CM | POA: Diagnosis not present

## 2020-11-20 DIAGNOSIS — I5042 Chronic combined systolic (congestive) and diastolic (congestive) heart failure: Secondary | ICD-10-CM | POA: Diagnosis not present

## 2020-11-21 ENCOUNTER — Ambulatory Visit (INDEPENDENT_AMBULATORY_CARE_PROVIDER_SITE_OTHER): Payer: Medicare Other | Admitting: Internal Medicine

## 2020-11-21 ENCOUNTER — Telehealth: Payer: Self-pay | Admitting: Pharmacist

## 2020-11-21 DIAGNOSIS — Z5181 Encounter for therapeutic drug level monitoring: Secondary | ICD-10-CM | POA: Diagnosis not present

## 2020-11-21 DIAGNOSIS — I5042 Chronic combined systolic (congestive) and diastolic (congestive) heart failure: Secondary | ICD-10-CM | POA: Diagnosis not present

## 2020-11-21 DIAGNOSIS — D631 Anemia in chronic kidney disease: Secondary | ICD-10-CM | POA: Diagnosis not present

## 2020-11-21 DIAGNOSIS — F0281 Dementia in other diseases classified elsewhere with behavioral disturbance: Secondary | ICD-10-CM | POA: Diagnosis not present

## 2020-11-21 DIAGNOSIS — G40909 Epilepsy, unspecified, not intractable, without status epilepticus: Secondary | ICD-10-CM | POA: Diagnosis not present

## 2020-11-21 DIAGNOSIS — I130 Hypertensive heart and chronic kidney disease with heart failure and stage 1 through stage 4 chronic kidney disease, or unspecified chronic kidney disease: Secondary | ICD-10-CM | POA: Diagnosis not present

## 2020-11-21 DIAGNOSIS — N183 Chronic kidney disease, stage 3 unspecified: Secondary | ICD-10-CM | POA: Diagnosis not present

## 2020-11-21 LAB — POCT INR: INR: 1.9 — AB (ref 2.0–3.0)

## 2020-11-21 NOTE — Telephone Encounter (Signed)
[facility] nurse called.  Wife was concerned because patients systolic blood pressure was in 100s-110s.  Is concerned the torsemide is causing blood pressure to decrease. (This seems to be patient's baseline per last years's BP readings).  Explained that the torsemide is for possible edema and swelling which wife says is still occurring.  Reccommended she continue the torsemide but to call if systolic BP decreases less than 100 and a new plan can me made.  Patient's wife also requests Dr Irish Lack to place an order for a home INR machine since patient has issues coming to clinic due to dementia and parkinsons

## 2020-11-21 NOTE — Telephone Encounter (Signed)
If he is having issues with falling or poor balance in the setting of Parkinsons and dementia, we may need to consider stopping Coumadin.

## 2020-11-21 NOTE — Patient Instructions (Signed)
Description   Spoke to [name] with [facility] while at pt's home, instructed for pt start taking warfarin 1 tablet daily except for 1/2 a tablet on Mondays and Fridays. Recheck INR in 1 week by [facility]. Coumadin Clinc [phone].

## 2020-11-22 DIAGNOSIS — E7800 Pure hypercholesterolemia, unspecified: Secondary | ICD-10-CM | POA: Diagnosis not present

## 2020-11-22 DIAGNOSIS — Z9181 History of falling: Secondary | ICD-10-CM | POA: Diagnosis not present

## 2020-11-22 DIAGNOSIS — I5042 Chronic combined systolic (congestive) and diastolic (congestive) heart failure: Secondary | ICD-10-CM | POA: Diagnosis not present

## 2020-11-22 DIAGNOSIS — Z950 Presence of cardiac pacemaker: Secondary | ICD-10-CM | POA: Diagnosis not present

## 2020-11-22 DIAGNOSIS — Z85828 Personal history of other malignant neoplasm of skin: Secondary | ICD-10-CM | POA: Diagnosis not present

## 2020-11-22 DIAGNOSIS — G40909 Epilepsy, unspecified, not intractable, without status epilepticus: Secondary | ICD-10-CM | POA: Diagnosis not present

## 2020-11-22 DIAGNOSIS — Z79899 Other long term (current) drug therapy: Secondary | ICD-10-CM | POA: Diagnosis not present

## 2020-11-22 DIAGNOSIS — F339 Major depressive disorder, recurrent, unspecified: Secondary | ICD-10-CM | POA: Diagnosis not present

## 2020-11-22 DIAGNOSIS — F0281 Dementia in other diseases classified elsewhere with behavioral disturbance: Secondary | ICD-10-CM | POA: Diagnosis not present

## 2020-11-22 DIAGNOSIS — I061 Rheumatic aortic insufficiency: Secondary | ICD-10-CM | POA: Diagnosis not present

## 2020-11-22 DIAGNOSIS — G20 Parkinson's disease: Secondary | ICD-10-CM | POA: Diagnosis not present

## 2020-11-22 DIAGNOSIS — R1312 Dysphagia, oropharyngeal phase: Secondary | ICD-10-CM | POA: Diagnosis not present

## 2020-11-22 DIAGNOSIS — I252 Old myocardial infarction: Secondary | ICD-10-CM | POA: Diagnosis not present

## 2020-11-22 DIAGNOSIS — E559 Vitamin D deficiency, unspecified: Secondary | ICD-10-CM | POA: Diagnosis not present

## 2020-11-22 DIAGNOSIS — E0590 Thyrotoxicosis, unspecified without thyrotoxic crisis or storm: Secondary | ICD-10-CM | POA: Diagnosis not present

## 2020-11-22 DIAGNOSIS — D631 Anemia in chronic kidney disease: Secondary | ICD-10-CM | POA: Diagnosis not present

## 2020-11-22 DIAGNOSIS — F419 Anxiety disorder, unspecified: Secondary | ICD-10-CM | POA: Diagnosis not present

## 2020-11-22 DIAGNOSIS — Z8546 Personal history of malignant neoplasm of prostate: Secondary | ICD-10-CM | POA: Diagnosis not present

## 2020-11-22 DIAGNOSIS — N183 Chronic kidney disease, stage 3 unspecified: Secondary | ICD-10-CM | POA: Diagnosis not present

## 2020-11-22 DIAGNOSIS — Z7901 Long term (current) use of anticoagulants: Secondary | ICD-10-CM | POA: Diagnosis not present

## 2020-11-22 DIAGNOSIS — I4891 Unspecified atrial fibrillation: Secondary | ICD-10-CM | POA: Diagnosis not present

## 2020-11-22 DIAGNOSIS — I2510 Atherosclerotic heart disease of native coronary artery without angina pectoris: Secondary | ICD-10-CM | POA: Diagnosis not present

## 2020-11-22 DIAGNOSIS — I130 Hypertensive heart and chronic kidney disease with heart failure and stage 1 through stage 4 chronic kidney disease, or unspecified chronic kidney disease: Secondary | ICD-10-CM | POA: Diagnosis not present

## 2020-11-22 NOTE — Telephone Encounter (Signed)
I spoke with patient's wife.  She reports patient had 5 falls over a 3 week period starting on 09/12/20 and going into January.  No serious injury. Had muscle soreness after fall but this is now better.  Since then home health and PT have started seeing patient.  Patient is also now using a walker.  No more falls. Wife is with him all the time when he is ambulating. BP today 116/61.  Family is looking into a facility for patient to move into.  Home health has been checking INR but will only be seeing patient a few more times.  They are asking if OK to change to using a home monitor machine. Patient will continue coumadin for now.  I told wife we would call her back if Dr Irish Lack recommends any changes and also check with him regarding home machine.

## 2020-11-22 NOTE — Telephone Encounter (Signed)
SInce falls have reduced, ok to use home monitor for INR check.

## 2020-11-23 ENCOUNTER — Telehealth: Payer: Self-pay | Admitting: Pharmacist

## 2020-11-23 NOTE — Telephone Encounter (Signed)
Patient's wife called earlier in the week to ask about a home INR testing machine for her husband.  Printed [employer] [facility] INR form and had Dr. Irish Lack sign.  Faxed over form and demographics sheet tp [employer].  Called wife and let her know.  Wife was appreciative/

## 2020-11-28 ENCOUNTER — Ambulatory Visit (INDEPENDENT_AMBULATORY_CARE_PROVIDER_SITE_OTHER): Payer: Medicare Other | Admitting: Pharmacist

## 2020-11-28 DIAGNOSIS — G40909 Epilepsy, unspecified, not intractable, without status epilepticus: Secondary | ICD-10-CM | POA: Diagnosis not present

## 2020-11-28 DIAGNOSIS — N183 Chronic kidney disease, stage 3 unspecified: Secondary | ICD-10-CM | POA: Diagnosis not present

## 2020-11-28 DIAGNOSIS — I130 Hypertensive heart and chronic kidney disease with heart failure and stage 1 through stage 4 chronic kidney disease, or unspecified chronic kidney disease: Secondary | ICD-10-CM | POA: Diagnosis not present

## 2020-11-28 DIAGNOSIS — I5042 Chronic combined systolic (congestive) and diastolic (congestive) heart failure: Secondary | ICD-10-CM | POA: Diagnosis not present

## 2020-11-28 DIAGNOSIS — F0281 Dementia in other diseases classified elsewhere with behavioral disturbance: Secondary | ICD-10-CM | POA: Diagnosis not present

## 2020-11-28 DIAGNOSIS — I4821 Permanent atrial fibrillation: Secondary | ICD-10-CM

## 2020-11-28 DIAGNOSIS — G20 Parkinson's disease: Secondary | ICD-10-CM | POA: Diagnosis not present

## 2020-11-28 DIAGNOSIS — Z5181 Encounter for therapeutic drug level monitoring: Secondary | ICD-10-CM | POA: Diagnosis not present

## 2020-11-28 LAB — POCT INR: INR: 2.4 (ref 2.0–3.0)

## 2020-11-28 NOTE — Patient Instructions (Signed)
Description   Spoke to [place] with [facility]. Continue taking warfarin 1 tablet daily except for 1/2 a tablet on Mondays and Fridays. Recheck INR in 2 weeks by [facility]. Coumadin Clinc [phone].

## 2020-12-05 DIAGNOSIS — I5042 Chronic combined systolic (congestive) and diastolic (congestive) heart failure: Secondary | ICD-10-CM | POA: Diagnosis not present

## 2020-12-05 DIAGNOSIS — F0281 Dementia in other diseases classified elsewhere with behavioral disturbance: Secondary | ICD-10-CM | POA: Diagnosis not present

## 2020-12-05 DIAGNOSIS — G40909 Epilepsy, unspecified, not intractable, without status epilepticus: Secondary | ICD-10-CM | POA: Diagnosis not present

## 2020-12-05 DIAGNOSIS — G20 Parkinson's disease: Secondary | ICD-10-CM | POA: Diagnosis not present

## 2020-12-05 DIAGNOSIS — I130 Hypertensive heart and chronic kidney disease with heart failure and stage 1 through stage 4 chronic kidney disease, or unspecified chronic kidney disease: Secondary | ICD-10-CM | POA: Diagnosis not present

## 2020-12-05 DIAGNOSIS — N183 Chronic kidney disease, stage 3 unspecified: Secondary | ICD-10-CM | POA: Diagnosis not present

## 2020-12-12 ENCOUNTER — Ambulatory Visit (INDEPENDENT_AMBULATORY_CARE_PROVIDER_SITE_OTHER): Payer: Medicare Other | Admitting: Pharmacist

## 2020-12-12 DIAGNOSIS — Z5181 Encounter for therapeutic drug level monitoring: Secondary | ICD-10-CM | POA: Diagnosis not present

## 2020-12-12 DIAGNOSIS — G20 Parkinson's disease: Secondary | ICD-10-CM | POA: Diagnosis not present

## 2020-12-12 DIAGNOSIS — I130 Hypertensive heart and chronic kidney disease with heart failure and stage 1 through stage 4 chronic kidney disease, or unspecified chronic kidney disease: Secondary | ICD-10-CM | POA: Diagnosis not present

## 2020-12-12 DIAGNOSIS — G40909 Epilepsy, unspecified, not intractable, without status epilepticus: Secondary | ICD-10-CM | POA: Diagnosis not present

## 2020-12-12 DIAGNOSIS — I4821 Permanent atrial fibrillation: Secondary | ICD-10-CM

## 2020-12-12 DIAGNOSIS — N183 Chronic kidney disease, stage 3 unspecified: Secondary | ICD-10-CM | POA: Diagnosis not present

## 2020-12-12 DIAGNOSIS — I5042 Chronic combined systolic (congestive) and diastolic (congestive) heart failure: Secondary | ICD-10-CM | POA: Diagnosis not present

## 2020-12-12 DIAGNOSIS — F0281 Dementia in other diseases classified elsewhere with behavioral disturbance: Secondary | ICD-10-CM | POA: Diagnosis not present

## 2020-12-12 LAB — POCT INR: INR: 2 (ref 2.0–3.0)

## 2020-12-13 DIAGNOSIS — B351 Tinea unguium: Secondary | ICD-10-CM | POA: Diagnosis not present

## 2020-12-13 DIAGNOSIS — I739 Peripheral vascular disease, unspecified: Secondary | ICD-10-CM | POA: Diagnosis not present

## 2020-12-17 ENCOUNTER — Telehealth: Payer: Self-pay

## 2020-12-17 NOTE — Telephone Encounter (Signed)
Call from [facility] stating they will be taking over pt's anticoagulation.  Last [facility] visit scheduled for 12/19/20, INR 2.0 at last check.  They have instructed pt's wife to have pt hold his Warfarin dosage tomorrow 12/18/20 prior to recheck on 12/19/20 and [facility] RN will call their office with results, number already given to pt's wife.  They will be transitioning pt off Warfarin and onto Eliquis. Will discontinue pt's anticoagulation episode.

## 2020-12-18 NOTE — Progress Notes (Signed)
Cardiology Office Note   Date:  12/19/2020   ID:  Eddie Park, DOB [birth year], MRN [id number]  PCP:  Marda Stalker, PA-C    No chief complaint on file.  AFib  Wt Readings from Last 3 Encounters:  12/19/20 101 lb (45.8 kg)  11/08/20 98 lb (44.5 kg)  08/28/20 100 lb 11.2 oz (45.7 kg)       History of Present Illness: Eddie Park is a 87 y.o. male  with AFib, mitral regurgitation, cardiomyopathy, and pacer dependent. He is now in AFib 100% of the time by pacer check. EF 45% in 2011.  Echo in 11/17 showed: Left ventricle: The cavity size was normal. There was mild concentric hypertrophy. Systolic function was mildly to moderately reduced. The estimated ejection fraction was in the range of 40% to 45%. Mild diffuse hypokinesis with no identifiable regional variations. The study is not technically sufficient to allow evaluation of LV diastolic function. - Ventricular septum: Septal motion showed paradox. These changes are consistent with right ventricular pacing. - Aortic valve: There was moderate regurgitation directed centrally in the LVOT. - Mitral valve: There was mild regurgitation directed centrally. - Left atrium: The atrium was severely dilated. - Right ventricle: The cavity size was mildly dilated. - Tricuspid valve: There was moderate regurgitation. - Pulmonary arteries: Systolic pressure was mildly increased. PA peak pressure: 39 mm Hg (S). - Pericardium, extracardiac: A small pericardial effusion was identified posterior to the heart. The fluid had no internal echoes.There was no evidence of hemodynamic compromise. There was a left pleural effusion.  In the past, he has had issues with falls, but this decreased with less Lasix. He uses a cane.  In 10/17, he required increased diuretics for LE edema and more SHOB.He was hospitalized in 11/17 and diuresed 3.6 L.  Mitral regurgitation had been noted to be severe in the  past.  Given his age, we discussed mitral valve intervention but he was not interested.  This seemed reasonable given his age and overall health status.  Had infection and urolgic surgery in 6/19.  Wife reports that he is slowing down and memory is decreasing.  We discussed in 3/21: " If falls become more of a problem, may need to reconsider Coumadin.  This was discussed with the family in the room.  The concern obviously is that his risk of stroke would increase since he is in atrial fibrillation."  Walks with a walker and needs supervision due to Parkinsons.     Past Medical History:  Diagnosis Date  . Anticoagulated on Coumadin   . Anxiety   . Aortic insufficiency    a. mod-severe by echo 2014. b. mod by echo 2017.  [name] Chronic anemia   . Chronic combined systolic and diastolic CHF (congestive heart failure) ([place])   . CKD (chronic kidney disease), stage III ([place])   . Coronary artery disease    a. s/p MI 1995.  [name] Hard of hearing   . Hypertension   . Hyponatremia   . Mitral regurgitation    a. prev severe, more recently mild in 2017 -> not felt to be candidate for any kind of surgical repair.  [name] Permanent atrial fibrillation ([place])   . Prostate cancer ([place]) 2002  . Seizure disorder ([place])   . Tachycardia-bradycardia syndrome ([place])    a. w/ sycope - initial implant 2007, last gen change 2013 with BSX dual chamber PPM.    Past Surgical History:  Procedure Laterality Date  . artificial  urinary sphincter    . CYSTOSCOPY N/A 03/07/2018   Procedure: CYSTOSCOPY FLEXIBLE;  Surgeon: Raynelle Bring, MD;  Location: [facility];  Service: Urology;  Laterality: N/A;  . HERNIA REPAIR  2002  . PACEMAKER INSERTION  04/29/06   Generator change (BSc) by Dr Rayann Heman 06/21/12  . PERMANENT PACEMAKER GENERATOR CHANGE N/A 06/08/2012   Procedure: PERMANENT PACEMAKER GENERATOR CHANGE;  Surgeon: Thompson Grayer, MD;  Location: [facility] CATH LAB;  Service: Cardiovascular;  Laterality: N/A;  . PROSTATE SURGERY    .  PROSTATECTOMY  2002  . URINARY SPHINCTER IMPLANT N/A 03/07/2018   Procedure: ARTIFICIAL URINARY SPHINCTER REMOVAL;  Surgeon: Raynelle Bring, MD;  Location: [facility];  Service: Urology;  Laterality: N/A;     Current Outpatient Medications  Medication Sig Dispense Refill  . carbidopa-levodopa (SINEMET IR) 25-100 MG tablet Take 1 tablet by mouth 3 (three) times daily. 90 tablet 6  . cholecalciferol (VITAMIN D) 1000 UNITS tablet Take 2,000 Units by mouth daily.    . ferrous sulfate 325 (65 FE) MG EC tablet Take 325 mg by mouth daily with breakfast.    . levETIRAcetam (KEPPRA) 500 MG tablet Take 1 tablet (500 mg total) by mouth 2 (two) times daily. Please request future refills from PCP. 180 tablet 0  . Multiple Vitamin (MULTIVITAMIN WITH MINERALS) TABS Take 1 tablet by mouth daily.    . potassium chloride (KLOR-CON) 10 MEQ tablet Take 10 mEq by mouth daily.    . sertraline (ZOLOFT) 100 MG tablet Take 100 mg by mouth daily.    [name] torsemide (DEMADEX) 20 MG tablet Take 0.5 tablets (10 mg total) by mouth daily. 45 tablet 0  . warfarin (COUMADIN) 5 MG tablet TAKE BY MOUTH AS DIRECTED BY COUMADIN CLINIC 100 tablet 1   No current facility-administered medications for this visit.    Allergies:   Patient has no known allergies.    Social History:  The patient  reports that he has never smoked. He has never used smokeless tobacco. He reports that he does not drink alcohol and does not use drugs.   Family History:  The patient's family history includes Colon polyps in his brother; Diabetes in his father; Heart attack in his brother, father, and mother; Heart disease in his father and mother.    ROS:  Please see the history of present illness.   Otherwise, review of systems are positive for falls reduced with PT, easy bruising.   All other systems are reviewed and negative.    PHYSICAL EXAM: VS:  BP 98/62   Pulse 68   Ht 5\' 6"  (1.676 m)   Wt 101 lb (45.8 kg)   SpO2 (!) 6%   BMI 16.30 kg/m  , BMI  Body mass index is 16.3 kg/m. GEN: Well nourished, well developed, in no acute distress  HEENT: normal  Neck: no JVD, carotid bruits, or masses Cardiac: RRR; no murmurs, rubs, or gallops,no edema  Respiratory:  clear to auscultation bilaterally, normal work of breathing GI: soft, nontender, nondistended, + BS MS: no deformity or atrophy  Skin: warm and dry, no rash Neuro:  Strength and sensation are intact Psych: euthymic mood, full affect   EKG:   The ekg ordered at EP visit in 10/21 demonstrates V paced rhythm   Recent Labs: 08/28/2020: Hemoglobin 10.0; Platelet Count 97   Lipid Panel    Component Value Date/Time   CHOL  09/26/2009 2344    142        ATP III CLASSIFICATION:  <  200     mg/dL   Desirable  200-239  mg/dL   Borderline High  >=240    mg/dL   High          TRIG 65 09/26/2009 2344   HDL 42 09/26/2009 2344   CHOLHDL 3.4 09/26/2009 2344   VLDL 13 09/26/2009 2344   LDLCALC  09/26/2009 2344    87        Total Cholesterol/HDL:CHD Risk Coronary Heart Disease Risk Table                     Men   Women  1/2 Average Risk   3.4   3.3  Average Risk       5.0   4.4  2 X Average Risk   9.6   7.1  3 X Average Risk  23.4   11.0        Use the calculated Patient Ratio above and the CHD Risk Table to determine the patient's CHD Risk.        ATP III CLASSIFICATION (LDL):  <100     mg/dL   Optimal  100-129  mg/dL   Near or Above                    Optimal  130-159  mg/dL   Borderline  160-189  mg/dL   High  >190     mg/dL   Very High     Other studies Reviewed: Additional studies/ records that were reviewed today with results demonstrating: INR 2.3 today.     ASSESSMENT AND PLAN:  1. AFib: Switching to low dose Eliquis at the [place].  Hold Coumadin and take first dose of Eliquis Friday. OK now to eat leafy greens.   2. HTN: May need to decrease frequency of torsemide. 3. Chronic systolic heart failure: Appears euvolemic.  Not a candidate for invasive therapy.   4. Pacemaker: last ECG showed pacer spike. 5. Anticoagulated: Avoid falls.  6. He wants to be a DNR. THey have paperwork at home.  I think this is reasonable.  Discussed comfort care and treating symptoms.    Current medicines are reviewed at length with the patient today.  The patient concerns regarding his medicines were addressed.  The following changes have been made:  No change  Labs/ tests ordered today include:  No orders of the defined types were placed in this encounter.   Recommend 150 minutes/week of aerobic exercise Low fat, low carb, high fiber diet recommended  Disposition:   FU in 1 year   Signed, Larae Grooms, MD  12/19/2020 11:25 AM    [facility] [place], [place], [place]  [address] Phone: [phone]; Fax: [phone]

## 2020-12-19 ENCOUNTER — Encounter: Payer: Self-pay | Admitting: Interventional Cardiology

## 2020-12-19 ENCOUNTER — Ambulatory Visit (INDEPENDENT_AMBULATORY_CARE_PROVIDER_SITE_OTHER): Payer: Medicare Other | Admitting: Interventional Cardiology

## 2020-12-19 ENCOUNTER — Telehealth: Payer: Self-pay | Admitting: *Deleted

## 2020-12-19 ENCOUNTER — Other Ambulatory Visit: Payer: Self-pay

## 2020-12-19 VITALS — BP 98/62 | HR 68 | Ht 66.0 in | Wt 101.0 lb

## 2020-12-19 DIAGNOSIS — I5042 Chronic combined systolic (congestive) and diastolic (congestive) heart failure: Secondary | ICD-10-CM

## 2020-12-19 DIAGNOSIS — I10 Essential (primary) hypertension: Secondary | ICD-10-CM

## 2020-12-19 DIAGNOSIS — I4821 Permanent atrial fibrillation: Secondary | ICD-10-CM | POA: Diagnosis not present

## 2020-12-19 DIAGNOSIS — Z950 Presence of cardiac pacemaker: Secondary | ICD-10-CM

## 2020-12-19 DIAGNOSIS — G20 Parkinson's disease: Secondary | ICD-10-CM | POA: Diagnosis not present

## 2020-12-19 DIAGNOSIS — F0281 Dementia in other diseases classified elsewhere with behavioral disturbance: Secondary | ICD-10-CM | POA: Diagnosis not present

## 2020-12-19 DIAGNOSIS — N183 Chronic kidney disease, stage 3 unspecified: Secondary | ICD-10-CM | POA: Diagnosis not present

## 2020-12-19 DIAGNOSIS — I130 Hypertensive heart and chronic kidney disease with heart failure and stage 1 through stage 4 chronic kidney disease, or unspecified chronic kidney disease: Secondary | ICD-10-CM | POA: Diagnosis not present

## 2020-12-19 DIAGNOSIS — G40909 Epilepsy, unspecified, not intractable, without status epilepticus: Secondary | ICD-10-CM | POA: Diagnosis not present

## 2020-12-19 NOTE — Telephone Encounter (Signed)
[name] from [facility] called and stated that she checked pt's INR today and pt's INR was 2.3.  See phone note from 12/17/2020. [facility] and [facility] him to Eliquis. [facility] nurse stated that she was going to call results to the [place] and make them aware.

## 2020-12-19 NOTE — Patient Instructions (Signed)
Medication Instructions:  Your physician recommends that you continue on your current medications as directed. Please refer to the Current Medication list given to you today. May adjust torsemide dose as needed for swelling *If you need a refill on your cardiac medications before your next appointment, please call your pharmacy*   Lab Work: none If you have labs (blood work) drawn today and your tests are completely normal, you will receive your results only by: [name] MyChart Message (if you have MyChart) OR . A paper copy in the mail If you have any lab test that is abnormal or we need to change your treatment, we will call you to review the results.   Testing/Procedures: none   Follow-Up: At [facility], you and your health needs are our priority.  As part of our continuing mission to provide you with exceptional heart care, we have created designated Provider Care Teams.  These Care Teams include your primary Cardiologist (physician) and Advanced Practice Providers (APPs -  Physician Assistants and Nurse Practitioners) who all work together to provide you with the care you need, when you need it.  We recommend signing up for the patient portal called "MyChart".  Sign up information is provided on this After Visit Summary.  MyChart is used to connect with patients for Virtual Visits (Telemedicine).  Patients are able to view lab/test results, encounter notes, upcoming appointments, etc.  Non-urgent messages can be sent to your provider as well.   To learn more about what you can do with MyChart, go to [email or web address].    Your next appointment:   As needed  The format for your next appointment:   In Person  Provider:   You may see Larae Grooms, MD or one of the following Advanced Practice Providers on your designated Care Team:    Melina Copa, PA-C  Ermalinda Barrios, PA-C    Other Instructions

## 2020-12-20 DIAGNOSIS — F0390 Unspecified dementia without behavioral disturbance: Secondary | ICD-10-CM | POA: Diagnosis not present

## 2020-12-20 DIAGNOSIS — N183 Chronic kidney disease, stage 3 unspecified: Secondary | ICD-10-CM | POA: Diagnosis not present

## 2020-12-20 DIAGNOSIS — F339 Major depressive disorder, recurrent, unspecified: Secondary | ICD-10-CM | POA: Diagnosis not present

## 2020-12-20 DIAGNOSIS — I5032 Chronic diastolic (congestive) heart failure: Secondary | ICD-10-CM | POA: Diagnosis not present

## 2020-12-20 DIAGNOSIS — I4891 Unspecified atrial fibrillation: Secondary | ICD-10-CM | POA: Diagnosis not present

## 2020-12-20 DIAGNOSIS — G20 Parkinson's disease: Secondary | ICD-10-CM | POA: Diagnosis not present

## 2020-12-20 DIAGNOSIS — E7800 Pure hypercholesterolemia, unspecified: Secondary | ICD-10-CM | POA: Diagnosis not present

## 2020-12-20 DIAGNOSIS — I5042 Chronic combined systolic (congestive) and diastolic (congestive) heart failure: Secondary | ICD-10-CM | POA: Diagnosis not present

## 2020-12-20 DIAGNOSIS — D631 Anemia in chronic kidney disease: Secondary | ICD-10-CM | POA: Diagnosis not present

## 2020-12-26 ENCOUNTER — Ambulatory Visit (INDEPENDENT_AMBULATORY_CARE_PROVIDER_SITE_OTHER): Payer: Medicare Other

## 2020-12-26 DIAGNOSIS — I495 Sick sinus syndrome: Secondary | ICD-10-CM | POA: Diagnosis not present

## 2020-12-26 NOTE — Telephone Encounter (Addendum)
Called and spoke to pt's wife to update pt's med list- to confirm Eliquis dose. Pt's [facility] stated [facility] pt is now taking Eliquis 2.5mg  BID. Pt has Eliquis 5mg  tablets taking 1/2 a tablet, twice a day.

## 2020-12-27 LAB — CUP PACEART REMOTE DEVICE CHECK
Battery Remaining Longevity: 42 mo
Battery Remaining Percentage: 48 %
Brady Statistic RA Percent Paced: 0 %
Brady Statistic RV Percent Paced: 82 %
Date Time Interrogation Session: 20220406004100
Implantable Lead Implant Date: 20130917
Implantable Lead Implant Date: 20130917
Implantable Lead Location: 753859
Implantable Lead Location: 753860
Implantable Lead Model: 4456
Implantable Lead Model: 4469
Implantable Lead Serial Number: 448722
Implantable Lead Serial Number: 484382
Implantable Pulse Generator Implant Date: 20130917
Lead Channel Impedance Value: 351 Ohm
Lead Channel Impedance Value: 371 Ohm
Lead Channel Setting Pacing Amplitude: 2.4 V
Lead Channel Setting Pacing Pulse Width: 0.4 ms
Lead Channel Setting Sensing Sensitivity: 2.5 mV
Pulse Gen Serial Number: 111635

## 2021-01-08 NOTE — Progress Notes (Signed)
Remote pacemaker transmission.   

## 2021-02-04 DIAGNOSIS — I130 Hypertensive heart and chronic kidney disease with heart failure and stage 1 through stage 4 chronic kidney disease, or unspecified chronic kidney disease: Secondary | ICD-10-CM | POA: Diagnosis not present

## 2021-02-04 DIAGNOSIS — F0281 Dementia in other diseases classified elsewhere with behavioral disturbance: Secondary | ICD-10-CM | POA: Diagnosis not present

## 2021-02-04 DIAGNOSIS — G20 Parkinson's disease: Secondary | ICD-10-CM | POA: Diagnosis not present

## 2021-02-04 DIAGNOSIS — F0390 Unspecified dementia without behavioral disturbance: Secondary | ICD-10-CM | POA: Diagnosis not present

## 2021-02-04 DIAGNOSIS — I2510 Atherosclerotic heart disease of native coronary artery without angina pectoris: Secondary | ICD-10-CM | POA: Diagnosis not present

## 2021-02-04 DIAGNOSIS — N1831 Chronic kidney disease, stage 3a: Secondary | ICD-10-CM | POA: Diagnosis not present

## 2021-02-04 DIAGNOSIS — I5042 Chronic combined systolic (congestive) and diastolic (congestive) heart failure: Secondary | ICD-10-CM | POA: Diagnosis not present

## 2021-02-04 DIAGNOSIS — E7800 Pure hypercholesterolemia, unspecified: Secondary | ICD-10-CM | POA: Diagnosis not present

## 2021-02-04 DIAGNOSIS — F339 Major depressive disorder, recurrent, unspecified: Secondary | ICD-10-CM | POA: Diagnosis not present

## 2021-02-04 DIAGNOSIS — Z8546 Personal history of malignant neoplasm of prostate: Secondary | ICD-10-CM | POA: Diagnosis not present

## 2021-02-04 DIAGNOSIS — I4891 Unspecified atrial fibrillation: Secondary | ICD-10-CM | POA: Diagnosis not present

## 2021-03-22 DIAGNOSIS — E871 Hypo-osmolality and hyponatremia: Secondary | ICD-10-CM | POA: Diagnosis not present

## 2021-03-22 DIAGNOSIS — I4891 Unspecified atrial fibrillation: Secondary | ICD-10-CM | POA: Diagnosis not present

## 2021-03-22 DIAGNOSIS — F32A Depression, unspecified: Secondary | ICD-10-CM | POA: Diagnosis not present

## 2021-03-22 DIAGNOSIS — F339 Major depressive disorder, recurrent, unspecified: Secondary | ICD-10-CM | POA: Diagnosis not present

## 2021-03-22 DIAGNOSIS — R41841 Cognitive communication deficit: Secondary | ICD-10-CM | POA: Diagnosis not present

## 2021-03-22 DIAGNOSIS — R5381 Other malaise: Secondary | ICD-10-CM | POA: Diagnosis not present

## 2021-03-22 DIAGNOSIS — I5022 Chronic systolic (congestive) heart failure: Secondary | ICD-10-CM | POA: Diagnosis not present

## 2021-03-22 DIAGNOSIS — D631 Anemia in chronic kidney disease: Secondary | ICD-10-CM | POA: Diagnosis not present

## 2021-03-22 DIAGNOSIS — D509 Iron deficiency anemia, unspecified: Secondary | ICD-10-CM | POA: Diagnosis not present

## 2021-03-22 DIAGNOSIS — E0520 Thyrotoxicosis with toxic multinodular goiter without thyrotoxic crisis or storm: Secondary | ICD-10-CM | POA: Diagnosis not present

## 2021-03-22 DIAGNOSIS — Z950 Presence of cardiac pacemaker: Secondary | ICD-10-CM | POA: Diagnosis not present

## 2021-03-22 DIAGNOSIS — F419 Anxiety disorder, unspecified: Secondary | ICD-10-CM | POA: Diagnosis not present

## 2021-03-22 DIAGNOSIS — G40909 Epilepsy, unspecified, not intractable, without status epilepticus: Secondary | ICD-10-CM | POA: Diagnosis not present

## 2021-03-22 DIAGNOSIS — F0390 Unspecified dementia without behavioral disturbance: Secondary | ICD-10-CM | POA: Diagnosis not present

## 2021-03-22 DIAGNOSIS — G20 Parkinson's disease: Secondary | ICD-10-CM | POA: Diagnosis not present

## 2021-03-22 DIAGNOSIS — N1831 Chronic kidney disease, stage 3a: Secondary | ICD-10-CM | POA: Diagnosis not present

## 2021-03-22 DIAGNOSIS — I10 Essential (primary) hypertension: Secondary | ICD-10-CM | POA: Diagnosis not present

## 2021-03-22 DIAGNOSIS — I130 Hypertensive heart and chronic kidney disease with heart failure and stage 1 through stage 4 chronic kidney disease, or unspecified chronic kidney disease: Secondary | ICD-10-CM | POA: Diagnosis not present

## 2021-03-22 DIAGNOSIS — E0590 Thyrotoxicosis, unspecified without thyrotoxic crisis or storm: Secondary | ICD-10-CM | POA: Diagnosis not present

## 2021-03-22 DIAGNOSIS — R1311 Dysphagia, oral phase: Secondary | ICD-10-CM | POA: Diagnosis not present

## 2021-03-23 DIAGNOSIS — Z79899 Other long term (current) drug therapy: Secondary | ICD-10-CM | POA: Diagnosis not present

## 2021-03-23 DIAGNOSIS — Z9841 Cataract extraction status, right eye: Secondary | ICD-10-CM | POA: Diagnosis not present

## 2021-03-23 DIAGNOSIS — I10 Essential (primary) hypertension: Secondary | ICD-10-CM | POA: Diagnosis not present

## 2021-03-23 DIAGNOSIS — D638 Anemia in other chronic diseases classified elsewhere: Secondary | ICD-10-CM | POA: Diagnosis present

## 2021-03-23 DIAGNOSIS — R531 Weakness: Secondary | ICD-10-CM | POA: Diagnosis not present

## 2021-03-23 DIAGNOSIS — I4891 Unspecified atrial fibrillation: Secondary | ICD-10-CM | POA: Diagnosis not present

## 2021-03-23 DIAGNOSIS — S72141D Displaced intertrochanteric fracture of right femur, subsequent encounter for closed fracture with routine healing: Secondary | ICD-10-CM | POA: Diagnosis not present

## 2021-03-23 DIAGNOSIS — N183 Chronic kidney disease, stage 3 unspecified: Secondary | ICD-10-CM | POA: Diagnosis not present

## 2021-03-23 DIAGNOSIS — I493 Ventricular premature depolarization: Secondary | ICD-10-CM | POA: Diagnosis not present

## 2021-03-23 DIAGNOSIS — N179 Acute kidney failure, unspecified: Secondary | ICD-10-CM | POA: Diagnosis not present

## 2021-03-23 DIAGNOSIS — Z515 Encounter for palliative care: Secondary | ICD-10-CM | POA: Diagnosis not present

## 2021-03-23 DIAGNOSIS — S72141A Displaced intertrochanteric fracture of right femur, initial encounter for closed fracture: Secondary | ICD-10-CM | POA: Diagnosis present

## 2021-03-23 DIAGNOSIS — G40909 Epilepsy, unspecified, not intractable, without status epilepticus: Secondary | ICD-10-CM | POA: Diagnosis present

## 2021-03-23 DIAGNOSIS — Z743 Need for continuous supervision: Secondary | ICD-10-CM | POA: Diagnosis not present

## 2021-03-23 DIAGNOSIS — Z950 Presence of cardiac pacemaker: Secondary | ICD-10-CM | POA: Diagnosis not present

## 2021-03-23 DIAGNOSIS — R627 Adult failure to thrive: Secondary | ICD-10-CM | POA: Diagnosis present

## 2021-03-23 DIAGNOSIS — S32591A Other specified fracture of right pubis, initial encounter for closed fracture: Secondary | ICD-10-CM | POA: Diagnosis not present

## 2021-03-23 DIAGNOSIS — I5022 Chronic systolic (congestive) heart failure: Secondary | ICD-10-CM | POA: Diagnosis present

## 2021-03-23 DIAGNOSIS — D509 Iron deficiency anemia, unspecified: Secondary | ICD-10-CM | POA: Diagnosis present

## 2021-03-23 DIAGNOSIS — S72001A Fracture of unspecified part of neck of right femur, initial encounter for closed fracture: Secondary | ICD-10-CM | POA: Diagnosis not present

## 2021-03-23 DIAGNOSIS — M25551 Pain in right hip: Secondary | ICD-10-CM | POA: Diagnosis not present

## 2021-03-23 DIAGNOSIS — I69398 Other sequelae of cerebral infarction: Secondary | ICD-10-CM | POA: Diagnosis not present

## 2021-03-23 DIAGNOSIS — G20 Parkinson's disease: Secondary | ICD-10-CM | POA: Diagnosis present

## 2021-03-23 DIAGNOSIS — I4821 Permanent atrial fibrillation: Secondary | ICD-10-CM | POA: Diagnosis present

## 2021-03-23 DIAGNOSIS — Z8673 Personal history of transient ischemic attack (TIA), and cerebral infarction without residual deficits: Secondary | ICD-10-CM | POA: Diagnosis not present

## 2021-03-23 DIAGNOSIS — I517 Cardiomegaly: Secondary | ICD-10-CM | POA: Diagnosis not present

## 2021-03-23 DIAGNOSIS — R269 Unspecified abnormalities of gait and mobility: Secondary | ICD-10-CM | POA: Diagnosis not present

## 2021-03-23 DIAGNOSIS — J80 Acute respiratory distress syndrome: Secondary | ICD-10-CM | POA: Diagnosis not present

## 2021-03-23 DIAGNOSIS — N184 Chronic kidney disease, stage 4 (severe): Secondary | ICD-10-CM | POA: Diagnosis not present

## 2021-03-23 DIAGNOSIS — I495 Sick sinus syndrome: Secondary | ICD-10-CM | POA: Diagnosis present

## 2021-03-23 DIAGNOSIS — M25572 Pain in left ankle and joints of left foot: Secondary | ICD-10-CM | POA: Diagnosis not present

## 2021-03-23 DIAGNOSIS — R5381 Other malaise: Secondary | ICD-10-CM | POA: Diagnosis not present

## 2021-03-23 DIAGNOSIS — Z7901 Long term (current) use of anticoagulants: Secondary | ICD-10-CM | POA: Diagnosis not present

## 2021-03-23 DIAGNOSIS — I080 Rheumatic disorders of both mitral and aortic valves: Secondary | ICD-10-CM | POA: Diagnosis present

## 2021-03-23 DIAGNOSIS — Y999 Unspecified external cause status: Secondary | ICD-10-CM | POA: Diagnosis not present

## 2021-03-23 DIAGNOSIS — D696 Thrombocytopenia, unspecified: Secondary | ICD-10-CM | POA: Diagnosis present

## 2021-03-23 DIAGNOSIS — E860 Dehydration: Secondary | ICD-10-CM | POA: Diagnosis present

## 2021-03-23 DIAGNOSIS — I130 Hypertensive heart and chronic kidney disease with heart failure and stage 1 through stage 4 chronic kidney disease, or unspecified chronic kidney disease: Secondary | ICD-10-CM | POA: Diagnosis not present

## 2021-03-23 DIAGNOSIS — Z9842 Cataract extraction status, left eye: Secondary | ICD-10-CM | POA: Diagnosis not present

## 2021-03-23 DIAGNOSIS — I5032 Chronic diastolic (congestive) heart failure: Secondary | ICD-10-CM | POA: Diagnosis not present

## 2021-03-23 DIAGNOSIS — W1839XA Other fall on same level, initial encounter: Secondary | ICD-10-CM | POA: Diagnosis not present

## 2021-03-23 DIAGNOSIS — I2510 Atherosclerotic heart disease of native coronary artery without angina pectoris: Secondary | ICD-10-CM | POA: Diagnosis present

## 2021-03-23 DIAGNOSIS — G311 Senile degeneration of brain, not elsewhere classified: Secondary | ICD-10-CM | POA: Diagnosis not present

## 2021-03-23 DIAGNOSIS — Z66 Do not resuscitate: Secondary | ICD-10-CM | POA: Diagnosis present

## 2021-03-23 DIAGNOSIS — F0390 Unspecified dementia without behavioral disturbance: Secondary | ICD-10-CM | POA: Diagnosis not present

## 2021-03-23 DIAGNOSIS — G9340 Encephalopathy, unspecified: Secondary | ICD-10-CM | POA: Diagnosis not present

## 2021-03-23 DIAGNOSIS — I351 Nonrheumatic aortic (valve) insufficiency: Secondary | ICD-10-CM | POA: Diagnosis not present

## 2021-03-23 DIAGNOSIS — I340 Nonrheumatic mitral (valve) insufficiency: Secondary | ICD-10-CM | POA: Diagnosis not present

## 2021-03-23 DIAGNOSIS — F0280 Dementia in other diseases classified elsewhere without behavioral disturbance: Secondary | ICD-10-CM | POA: Diagnosis present

## 2021-03-26 ENCOUNTER — Telehealth: Payer: Self-pay

## 2021-03-26 NOTE — Telephone Encounter (Signed)
Patient wife called in to cancel patient appointments due to him going into the hospital permanently and she wanted to make [facility] aware and to call if any additional questions. I have canceled appointments but he will remain in our system until he needs to be taken out

## 2021-04-22 DEATH — deceased

## 2021-08-27 ENCOUNTER — Ambulatory Visit: Payer: Medicare Other | Admitting: Oncology

## 2021-08-27 ENCOUNTER — Other Ambulatory Visit: Payer: Medicare Other
